# Patient Record
Sex: Female | Born: 1940 | Race: White | Hispanic: No | Marital: Single | Smoking: Former smoker
Health system: Southern US, Community
[De-identification: ages and names within clinical notes are randomized; demographics above are authoritative.]

## PROBLEM LIST (undated history)

## (undated) DIAGNOSIS — M810 Age-related osteoporosis without current pathological fracture: Secondary | ICD-10-CM

## (undated) DIAGNOSIS — R011 Cardiac murmur, unspecified: Secondary | ICD-10-CM

## (undated) DIAGNOSIS — F32A Depression, unspecified: Secondary | ICD-10-CM

## (undated) DIAGNOSIS — K429 Umbilical hernia without obstruction or gangrene: Secondary | ICD-10-CM

## (undated) DIAGNOSIS — M1990 Unspecified osteoarthritis, unspecified site: Secondary | ICD-10-CM

## (undated) DIAGNOSIS — R42 Dizziness and giddiness: Secondary | ICD-10-CM

## (undated) DIAGNOSIS — F329 Major depressive disorder, single episode, unspecified: Secondary | ICD-10-CM

## (undated) DIAGNOSIS — F419 Anxiety disorder, unspecified: Secondary | ICD-10-CM

## (undated) DIAGNOSIS — R7303 Prediabetes: Secondary | ICD-10-CM

## (undated) HISTORY — PX: TONSILLECTOMY: SUR1361

## (undated) HISTORY — PX: COLONOSCOPY: SHX174

## (undated) HISTORY — PX: BUNIONECTOMY: SHX129

## (undated) HISTORY — PX: BREAST EXCISIONAL BIOPSY: SUR124

## (undated) HISTORY — PX: BACK SURGERY: SHX140

## (undated) HISTORY — PX: OTHER SURGICAL HISTORY: SHX169

## (undated) HISTORY — PX: APPENDECTOMY: SHX54

---

## 1998-02-25 ENCOUNTER — Other Ambulatory Visit: Admission: RE | Admit: 1998-02-25 | Discharge: 1998-02-25 | Payer: Self-pay | Admitting: Obstetrics and Gynecology

## 1998-03-28 ENCOUNTER — Encounter: Admission: RE | Admit: 1998-03-28 | Discharge: 1998-06-26 | Payer: Self-pay | Admitting: Family Medicine

## 1999-11-27 ENCOUNTER — Encounter: Payer: Self-pay | Admitting: Family Medicine

## 1999-11-27 ENCOUNTER — Ambulatory Visit (HOSPITAL_COMMUNITY): Admission: RE | Admit: 1999-11-27 | Discharge: 1999-11-27 | Payer: Self-pay | Admitting: Family Medicine

## 2000-06-09 ENCOUNTER — Other Ambulatory Visit: Admission: RE | Admit: 2000-06-09 | Discharge: 2000-06-09 | Payer: Self-pay | Admitting: Family Medicine

## 2000-06-15 ENCOUNTER — Encounter: Payer: Self-pay | Admitting: Family Medicine

## 2000-06-15 ENCOUNTER — Encounter: Admission: RE | Admit: 2000-06-15 | Discharge: 2000-06-15 | Payer: Self-pay | Admitting: Family Medicine

## 2001-06-21 ENCOUNTER — Other Ambulatory Visit: Admission: RE | Admit: 2001-06-21 | Discharge: 2001-06-21 | Payer: Self-pay | Admitting: Family Medicine

## 2002-04-26 ENCOUNTER — Ambulatory Visit (HOSPITAL_COMMUNITY): Admission: RE | Admit: 2002-04-26 | Discharge: 2002-04-26 | Payer: Self-pay | Admitting: Family Medicine

## 2002-04-26 ENCOUNTER — Encounter: Payer: Self-pay | Admitting: Family Medicine

## 2002-05-03 ENCOUNTER — Encounter: Payer: Self-pay | Admitting: Family Medicine

## 2002-05-03 ENCOUNTER — Encounter: Admission: RE | Admit: 2002-05-03 | Discharge: 2002-05-03 | Payer: Self-pay | Admitting: Family Medicine

## 2002-08-16 ENCOUNTER — Encounter: Admission: RE | Admit: 2002-08-16 | Discharge: 2002-08-16 | Payer: Self-pay | Admitting: Family Medicine

## 2002-08-16 ENCOUNTER — Encounter: Payer: Self-pay | Admitting: Family Medicine

## 2003-05-03 ENCOUNTER — Encounter: Payer: Self-pay | Admitting: Family Medicine

## 2003-05-03 ENCOUNTER — Ambulatory Visit (HOSPITAL_COMMUNITY): Admission: RE | Admit: 2003-05-03 | Discharge: 2003-05-03 | Payer: Self-pay | Admitting: Family Medicine

## 2003-11-20 ENCOUNTER — Emergency Department (HOSPITAL_COMMUNITY): Admission: EM | Admit: 2003-11-20 | Discharge: 2003-11-20 | Payer: Self-pay | Admitting: Emergency Medicine

## 2003-12-04 ENCOUNTER — Ambulatory Visit (HOSPITAL_COMMUNITY): Admission: RE | Admit: 2003-12-04 | Discharge: 2003-12-04 | Payer: Self-pay | Admitting: Family Medicine

## 2003-12-06 ENCOUNTER — Inpatient Hospital Stay (HOSPITAL_COMMUNITY): Admission: EM | Admit: 2003-12-06 | Discharge: 2003-12-09 | Payer: Self-pay | Admitting: Family Medicine

## 2004-05-09 ENCOUNTER — Ambulatory Visit (HOSPITAL_COMMUNITY): Admission: RE | Admit: 2004-05-09 | Discharge: 2004-05-09 | Payer: Self-pay | Admitting: Family Medicine

## 2004-08-19 ENCOUNTER — Other Ambulatory Visit: Admission: RE | Admit: 2004-08-19 | Discharge: 2004-08-19 | Payer: Self-pay | Admitting: Family Medicine

## 2004-09-03 ENCOUNTER — Encounter: Admission: RE | Admit: 2004-09-03 | Discharge: 2004-09-03 | Payer: Self-pay | Admitting: Family Medicine

## 2005-07-07 ENCOUNTER — Ambulatory Visit (HOSPITAL_COMMUNITY): Admission: RE | Admit: 2005-07-07 | Discharge: 2005-07-07 | Payer: Self-pay | Admitting: Family Medicine

## 2005-11-26 ENCOUNTER — Other Ambulatory Visit: Admission: RE | Admit: 2005-11-26 | Discharge: 2005-11-26 | Payer: Self-pay | Admitting: Family Medicine

## 2005-12-03 ENCOUNTER — Encounter: Admission: RE | Admit: 2005-12-03 | Discharge: 2006-02-25 | Payer: Self-pay | Admitting: Family Medicine

## 2006-08-17 ENCOUNTER — Ambulatory Visit (HOSPITAL_COMMUNITY): Admission: RE | Admit: 2006-08-17 | Discharge: 2006-08-17 | Payer: Self-pay | Admitting: Family Medicine

## 2006-12-03 ENCOUNTER — Other Ambulatory Visit: Admission: RE | Admit: 2006-12-03 | Discharge: 2006-12-03 | Payer: Self-pay | Admitting: Family Medicine

## 2006-12-23 ENCOUNTER — Encounter: Admission: RE | Admit: 2006-12-23 | Discharge: 2007-03-23 | Payer: Self-pay | Admitting: Family Medicine

## 2007-03-24 ENCOUNTER — Encounter: Admission: RE | Admit: 2007-03-24 | Discharge: 2007-06-22 | Payer: Self-pay | Admitting: Family Medicine

## 2007-05-11 ENCOUNTER — Encounter: Admission: RE | Admit: 2007-05-11 | Discharge: 2007-08-09 | Payer: Self-pay | Admitting: Family Medicine

## 2007-09-06 ENCOUNTER — Ambulatory Visit (HOSPITAL_COMMUNITY): Admission: RE | Admit: 2007-09-06 | Discharge: 2007-09-06 | Payer: Self-pay | Admitting: Family Medicine

## 2007-12-06 ENCOUNTER — Other Ambulatory Visit: Admission: RE | Admit: 2007-12-06 | Discharge: 2007-12-06 | Payer: Self-pay | Admitting: Family Medicine

## 2007-12-21 ENCOUNTER — Encounter: Admission: RE | Admit: 2007-12-21 | Discharge: 2007-12-21 | Payer: Self-pay | Admitting: Family Medicine

## 2008-02-07 ENCOUNTER — Encounter: Admission: RE | Admit: 2008-02-07 | Discharge: 2008-05-07 | Payer: Self-pay | Admitting: Orthopedic Surgery

## 2008-05-10 ENCOUNTER — Encounter: Admission: RE | Admit: 2008-05-10 | Discharge: 2008-06-21 | Payer: Self-pay | Admitting: Family Medicine

## 2008-09-11 ENCOUNTER — Ambulatory Visit (HOSPITAL_COMMUNITY): Admission: RE | Admit: 2008-09-11 | Discharge: 2008-09-11 | Payer: Self-pay | Admitting: Family Medicine

## 2008-09-20 ENCOUNTER — Encounter: Admission: RE | Admit: 2008-09-20 | Discharge: 2008-09-20 | Payer: Self-pay | Admitting: Family Medicine

## 2009-07-13 ENCOUNTER — Encounter: Admission: RE | Admit: 2009-07-13 | Discharge: 2009-07-13 | Payer: Self-pay | Admitting: Family Medicine

## 2009-11-19 ENCOUNTER — Ambulatory Visit (HOSPITAL_COMMUNITY): Admission: RE | Admit: 2009-11-19 | Discharge: 2009-11-19 | Payer: Self-pay | Admitting: Family Medicine

## 2010-10-20 ENCOUNTER — Encounter: Payer: Self-pay | Admitting: Family Medicine

## 2011-01-22 ENCOUNTER — Other Ambulatory Visit: Payer: Self-pay | Admitting: Family Medicine

## 2011-01-22 ENCOUNTER — Other Ambulatory Visit (HOSPITAL_COMMUNITY)
Admission: RE | Admit: 2011-01-22 | Discharge: 2011-01-22 | Disposition: A | Discharge status: Home / Self Care | Payer: Medicare Other | Source: Ambulatory Visit | Attending: Family Medicine | Admitting: Family Medicine

## 2011-01-22 DIAGNOSIS — Z124 Encounter for screening for malignant neoplasm of cervix: Secondary | ICD-10-CM | POA: Insufficient documentation

## 2011-02-14 NOTE — H&P (Signed)
NAME:  Lynn Kim, Lynn Kim                        ACCOUNT NO.:  [id number]   MEDICAL RECORD NO.:  [id number]                   PATIENT TYPE:  INP   LOCATION:  3738                                 FACILITY:  [facility]   PHYSICIAN:  Hollice Espy, M.D.            DATE OF BIRTH:  Feb 04, 1941   DATE OF ADMISSION:  12/06/2003  DATE OF DISCHARGE:                                HISTORY & PHYSICAL   PRIMARY CARE DOCTOR:  Dr. Stacie Acres. White.   CHIEF COMPLAINT:  Abdominal pain.   HISTORY OF PRESENT ILLNESS:  This is a 70 year old white female who, on the  1st of March of this year, completed the second of 2 spinal scoliosis  correction surgeries at [facility].  Postop, the patient was doing  relatively well, although she was noting some increasing leg edema.  Over  the past few days, she has been noting increased abdominal pain and  swelling.  Initially, there had been some concern the patient might be  having a DVT, according to her, and she had an ultrasound done of her legs  which showed no evidence of any clot.  After speaking with her doctors at  [facility], she was advised to come into the hospital and have a CT of the abdomen  done.  The patient came into [facility] Emergency Room on the night of December 05, 2003.  Her lab work was unremarkable.  She had no significant white  count.  Her hemoglobin and hematocrit were mildly decreased at 9.3 and 28.8.  Her BUN and creatinine were found to be stable as well, however, CT of the  abdomen and pelvis noted that the patient had a large left retroperitoneal  fluid collection.  The preliminary read also read that this was felt to be  more of a seroma rather than an abscess.  The patient did not have any  elevated temperatures.  She was put on some p.o. Valium, which immediately  relaxed the patient, and she said she felt much better; she felt less of  this abdominal tenseness.  She denies any abdominal pain following the  Valium.  She did not  receive any IV narcotics and did not need any.  Dr.  Jennette Kettle, the ER attending, spoke to multiple physicians at [facility] and  apparently the surgeons who performed the procedure agreed to accept the  patient, however, beds were not available at [facility] as of December 06, 2003 at 12 p.m.  Dr. Jennette Kettle spoke with a number of physicians here as well,  but after it was found that the patient's PCP was Dr. Laurann Montana, it was  felt that the patient could go to the [facility].  The plan  will be for her to be stabilized and monitored while she is in the hospital.  If a bed should open up at [facility], she will be transferred over  there, otherwise,  she may need to have a drainage procedure done here.  Currently, the patient states that she has no complaints.  She denies any  headaches, abdominal pain, fevers, chills, chest pain, shortness of breath,  abdominal pain, extremity pain, hematuria, dysuria, constipation or  diarrhea.  She was having some mild shortness of breath when she first came  in.   PAST MEDICAL HISTORY:  1. Past medical history is for an appendectomy a few years ago.  2. She is status post 2-of-2 spinal scoliosis correction surgeries for a     curvature greater than 60%; the first procedure was done approximately 1     month ago; the second was done 10 days ago.  3. She also has 2 children by spontaneous vaginal deliveries.   MEDICATIONS:  The patient was on p.r.n. OxyContin, which she really says  that she did not need postop.   ALLERGIES:  She is allergic to PENICILLIN, SULFA, ERYTHROMYCIN, LANOLIN and  PETROLEUM-BASED PRODUCTS.   FAMILY HISTORY:  Family history is for CAD and questionable history of  breast CA in her mom.   PHYSICAL EXAMINATION:  VITALS:  Her vitals on admission were temperature of  97.9, pulse 90, respirations 20, blood pressure 148/67.  She is saturating  99% on room air.  GENERAL:  In general, she is alert and oriented  and appears slightly  relaxed, felt to be an effect secondary to the Valium.  She is certainly in  no apparent distress.  HEENT:  Normocephalic, atraumatic.  She looks slightly fatigued.  Mucous  membranes are moist.  NECK:  She has no carotid bruits.  HEART:  Regular rate and rhythm, S1, S2.  She had a questionable 1-2/6  systolic ejection murmur, however, this may not be so.  LUNGS:  Lungs are clear to auscultation bilaterally.  ABDOMEN:  Abdomen is noted to be distended and soft, nontender.  Somewhat  decreased breath sounds.  Her incision is approximately 4-5 inches, running  along in a vertical fashion in the left upper down to right lower quadrant.  Staples are intact.  Incision is clean and dry; there is no drainage.  EXTREMITIES:  Extremities show no clubbing, cyanosis, or edema.  She has on  TED hose which were started back when she was having some complaints of  lower extremity edema postop.   LABORATORY WORK:  CT is as above in the history and physical.   Her white count is 9.8, hemoglobin and hematocrit 9.3/28.8, MCV of 89,  platelet count of 321,000.  Her sodium is 137, potassium 3.8, chloride 99,  bicarb 28, BUN 8, creatinine 1, glucose is 107.   ASSESSMENT AND PLAN:  This is a 70 year old white female, status post a  significant spinal scoliosis correction surgery at [facility], coming in  with 2 days of abdominal pain and found to have a retroperitoneal fluid  collection, to make sure the patient is stable.  We will try to see if a bed  opens up at [facility], as the patient is accepted, once a bed is free; if not,  the patient will need drainage of this fluid.  I question if this can be  done by interventional radiology.  By the film reports, a seroma greater  than an abscess is suspected and I do note the patient does not have  increased white count or a temperature.  I would therefore hold off on antibiotics unless this changes.  Dr. Molli Hazard B. Daphine Deutscher from surgery   evaluated the  patient earlier and I will discuss with him whether this can  be drained by interventional radiology.                                                Hollice Espy, M.D.    SKK/MEDQ  D:  12/06/2003  T:  12/07/2003  Job:  [id number]

## 2011-02-14 NOTE — Consult Note (Signed)
NAME:  Lynn Kim, Lynn Kim                        ACCOUNT NO.:  [id number]   MEDICAL RECORD NO.:  [id number]                   PATIENT TYPE:  INP   LOCATION:  3738                                 FACILITY:  [facility]   PHYSICIAN:  Mark C. Vernie Ammons, M.D.               DATE OF BIRTH:  Sep 10, 1941   DATE OF CONSULTATION:  12/07/2003  DATE OF DISCHARGE:                                   CONSULTATION   This very pleasant 70 year old white female underwent second-stage scoliosis  surgery on March 1 at [facility]. This required an abdominal approach with  mobilization of the bowel to access her spine. The patient did well until  she had some lower extremity swelling which was evaluated with a Doppler  ultrasound to rule out DVT. The patient then noted progressive abdominal  swelling starting approximately five days ago and increasing with eventual  associated pain. She did not ever report any significant flank pain. She was  imaged with a CT scan of the abdomen that revealed a large retroperitoneal  fluid collection. It did not appear to be an abscess due to its homogeneous  nature. There also was noted some mild left hydronephrosis but contrast was  seen within the collecting system promptly. The later did appear normal as  did the right kidney. Neither kidney appeared to have any parenchymal  lesions. She had no prior significant past urologic history. She also denies  any voiding complaints or hematuria.   PAST MEDICAL HISTORY:  1. Positive for scoliosis.  2. Two vaginal deliveries.  Otherwise, she has been in good health.   PAST SURGICAL HISTORY:  She has had the two scoliosis correction surgeries  as noted above. Appendectomy several years ago.   MEDICATIONS ON ADMISSION:  OxyContin.   ALLERGIES:  PENICILLIN, SULFA, ERYTHROMYCIN, LANOLIN PETROLEUM BASED  PRODUCTS.   FAMILY HISTORY:  Father has a history of coronary artery disease. There is  also a possible history of breast cancer in her  mother.   REVIEW OF SYSTEMS:  Reveals no bowel difficulty. She has not had any urinary  incontinence or other voiding complaints. She does have some pain in the  lower abdomen, but it is improved now with the drainage of the fluid  collection. She has no chest pain or shortness of breath.   PHYSICAL EXAMINATION:  GENERAL:  The patient is a well-developed, well-  nourished, white female in no apparent distress. She is afebrile,  temperature of 98.3, blood pressure is 133/66, pulse 96, respirations 20, O2  saturation 99.  HEENT:  Atraumatic and normocephalic. Oropharynx is clear.  NECK:  Reveals no mass, midline trachea.  LUNGS:  Clear bilaterally with no respiratory effort.  CARDIOVASCULAR:  Regular rate and rhythm with possible soft systolic  ejection murmur.  ABDOMEN:  Soft. It is flat at this time with an incision in the left flank  running obliquely into the left lower quadrant, a drain  in the left lower  quadrant draining slightly cloudy amber-appearing fluid. There are staples  in her skin incision, but no evidence of infection.  EXTREMITIES:  Without clubbing, cyanosis, or edema.  SKIN:  Warm and dry.  NEUROLOGICAL:  She is alert and oriented with appropriate mood and affect.   LABORATORY DATA:  White blood cell count 9.8, hemoglobin and hematocrit 9.3  and 28.8, platelets are normal at 321,000. Creatinine is 0.8; creatinine  from the fluid drained from her abdomen 0.8.   IMPRESSION:  The left retroperitoneal fluid collection in this patient after  recent retroperitoneal surgery appears to have not come from the urinary  tract. The fluid creatinine is equal to that of the serum and therefore not  urine. It is most likely lymph/serous in nature. It does not appear to be  infected, nor does the patient appear toxic in any way. The mild left  hydronephrosis seen on her CT scan is likely secondary to her recent  ureteral mobilization and the compressive effects of the large  fluid  collection. I do not recommend any further treatment for the hydronephrosis  other than drainage, and followup will undoubtedly occur with reimaging of  the abdomen which will be needed to follow her fluid collection. I am going  to send a second specimen of the fluid off just to confirm that this is not  urine. If the second specimen returns with creatinine similar to serum, no  further urologic followup will be needed at this time.                                               Mark C. Vernie Ammons, M.D.    MCO/MEDQ  D:  12/07/2003  T:  12/09/2003  Job:  [id number]   cc:   Stacie Acres. White, M.D.  [address]  [place]  [place] [address]  Fax: [phone]

## 2011-02-14 NOTE — Discharge Summary (Signed)
NAME:  Lynn Kim, Lynn Kim                        ACCOUNT NO.:  [id number]   MEDICAL RECORD NO.:  [id number]                   PATIENT TYPE:  INP   LOCATION:  3738                                 FACILITY:  [facility]   PHYSICIAN:  Melissa L. Ladona Ridgel, MD               DATE OF BIRTH:  [birth year]   DATE OF ADMISSION:  12/06/2003  DATE OF DISCHARGE:  12/09/2003                                 DISCHARGE SUMMARY   ADMITTING DIAGNOSIS:  Abdominal pain with retroperitoneal fluid collection.   DISCHARGE DIAGNOSES:  1. Retroperitoneal fluid collection status post scoliosis surgery requiring     interventional radiology drainage.  2. Soft loose stool being followed up for Clostridium difficile colitis.   HISTORY OF PRESENT ILLNESS:  Patient is a 70 year old white female who on  November 28, 2003 completed the second phase of her spinal scoliosis corrective  surgery at [facility].  She returned home postoperatively and was doing  well until she noted some leg edema which was evaluated with Doppler as an  outpatient and found to have no DVT.  On the day of admission however, she  developed some new symptoms __________ back and abdominal discomfort, the  sensation was of a stretching or pulling, she came to the emergency room and  was found to have a large fluid collection retroperitoneally located and  causing some impingement on her left ureter resulting in hydronephrosis.   HOSPITAL COURSE:  The patient was admitted to the internal medicine  service/hospitalist service and taken to the interventional radiology lab  for drain placement.  The procedure went well and the patient was noted to  drain a large quantity of serous yellow fluid, the creatinine of which was  0.8 making it not consistent with urine.  The fluid cultures at this time  remained negative.  The patient was seen by infectious disease during the  course of the hospitalization who assisted with broad-spectrum coverage as  her initial  white count was 11.2 with low-grade temperature.  She had been  placed initially on Cipro and Flagyl IV, this was changed when it became  obvious that this was more of a serous collection to Cipro alone and  eventually the Cipro was made an oral delivery.  Surgery was kind enough to  evaluate this patient for me just to assist in making sure that all of her  needs were met while she awaited transfer to [facility].  They concurred with the  current supportive therapy continuing to drain the area and we are planning  to do CT of the abdomen should she continue to remain with [place] on Monday.  Her activity was advanced at the time of discharge to being up with moderate  activity and her corset in place.  On December 09, 2003 the drain showed  approximately only 75 mL of serous fluid, she was afebrile at 98.9, blood  pressure was 103/69 with a saturation  of 95%.  Her exam on the day of  discharge revealed generally in mild distress secondary to some abdominal  discomfort however, she denied nausea or vomiting; her pupils are equal,  round, reactive to light; extraocular muscles are intact; chest is clear to  auscultation; abdomen remains slightly distended, slightly firm, but not  hard with some pain on palpation but no guarding or rebound, the drain is  clean, dry, and intact; extremities show no edema and 2+ pulses; her back  reveals a healing incision and is Steri-Stripped, it is clean, dry, and  intact.   The patient has had some small soft stools while hospitalized, these had  been requested to be sent for C. difficile in light of the fact that she has  had antibiotic exposure recently with her operative course, to date no  cultures have returned.  On the day of discharge her laboratory values  reveal a hemoglobin of 10.4, hematocrit of 30.9, white blood count remains  at 11.2, platelets are 318, her CMET is sodium of 134, potassium of 3.8,  chloride 104, CO2 of 27, BUN of 9, creatinine 0.8, and  glucose of 101.  T-  max appeared to be 99.2.   The patient's primary orthopedist and primary surgeon have been aware of her  care during the course of this hospitalization and have arranged for her to  be discharged to their care when a bed becomes available.  So at the time of  discharge the patient is deemed stable.  Recommendations will be only to  follow up the final culture results on the fluid obtained here at [facility]  and obtain stool for C. difficile should she continue to have soft stool.  Also the left hydronephrosis should be reevaluated since the serous  collection has been drained and is less likely to be impinging.  Urology has  seen the patient here at [facility] and supported pretty much the plan that  has already been in place and they had no further recommendations other than  the current course.                                                Melissa L. Ladona Ridgel, MD    MLT/MEDQ  D:  12/09/2003  T:  12/09/2003  Job:  [id number]   cc:   Dr. Senaida Ores @ [facility]   Dr. Joselyn Glassman @ [facility]   Fax [phone]

## 2011-02-14 NOTE — Consult Note (Signed)
NAME:  Lynn Kim, Lynn Kim                        ACCOUNT NO.:  [id number]   MEDICAL RECORD NO.:  [id number]                   PATIENT TYPE:  INP   LOCATION:  3738                                 FACILITY:  [facility]   PHYSICIAN:  Adolph Pollack, M.D.            DATE OF BIRTH:  05-Oct-1940   DATE OF CONSULTATION:  12/08/2003  DATE OF DISCHARGE:  12/09/2003                                   CONSULTATION   REASON FOR CONSULTATION:  Left retroperitoneal fluid collection  postoperatively.   HISTORY OF PRESENT ILLNESS:  This is a 70 year old female with scoliosis.  She underwent a complex two-stage procedure at [facility], first from a posterior  approach, and then on November 28, 2003 from an anterior approach for  stabilization of her lower lumbar spine. She was discharged from the  hospital and had been ambulatory with a brace on. She began having  increasing lower extremity edema and had a Duplex ultrasound negative for  DVT. She then had progressive increasing abdominal distention and pain and  presented to the emergency department. A CT scan demonstrated a large left  retroperitoneal fluid collection and mild left sided necrosis. She  subsequently was admitted secondary to that. Of note that her white blood  cell count was normal on admission. BUN and creatinine within normal limits.  While here in the hospital, she was empirically started on antibiotics. They  tried to transfer her back to [facility] although they said there were no beds  available. CT-guided left retroperitoneal drainage was performed, and a  large amount of fluid was evacuated. Fluid amylase level was normal. Fluid  creatinine level has been normal twice. The fluid cultures show no growth to  date. Suspicion was that she may have a lymphocele or seroma. She is  tolerating a diet and passing gas. I was asked to see her for further  management suggestions.   PAST MEDICAL HISTORY:  1. Appendicitis.  2. Scoliosis.  3.  Constipation.   PAST SURGICAL HISTORY:  1. Appendectomy.  2. Complex two-stage procedure for scoliosis as mentioned above.   ALLERGIES:  PENICILLIN, SULFA DRUGS, ERYTHROMYCIN.   CURRENT MEDICATIONS:  1. Oxycodone.  2. Cipro.  3. Valium.   SOCIAL HISTORY:  She denies any tobacco or alcohol use.   REVIEW OF SYSTEMS:  CARDIOVASCULAR:  There is no known heart disease or  hypertension. PULMONARY:  No chronic lung disease, pneumonia, asthma.  ENDOCRINE:  No diabetes or thyroid disease.  GENITOURINARY:  No renal disease or kidney stones. GASTROINTESTINAL:  She  does have constipation as mentioned above.   PHYSICAL EXAMINATION:  GENERAL:  A very pleasant, well-developed, well-  nourished female in no acute distress sitting up in the bed.  VITAL SIGNS:  She is afebrile with normal vital signs.  ABDOMEN:  Her abdomen is soft. It is slightly distended with active bowel  sounds. There is a right lower quadrant scar. There is an incision  in the  left lower quadrant that is oblique in nature with staples in. It is clean  without evidence of infection. She has a drain also in the left lower  quadrant with serous fluid coming from it.   LABORATORY DATA:  White blood cell count yesterday was 11,200. Amylase and  lipase normal. Cultures from the fluid are no growth to date.   IMPRESSION:  Left retroperitoneal postoperative seroma or possibly  lymphocele. The drain is in, and drainage has started to decrease some  versus the initial aspirate. So far, I agree with all management done.   RECOMMENDATIONS:  I will get her out of bed in her brace. I would repeat the  CT scan on Monday, March 14, if she is not already transferred to [facility]. We  will leave the drain in now until the drainage is still substantially less.                                               Adolph Pollack, M.D.    Kari Baars  D:  12/08/2003  T:  12/10/2003  Job:  [id number]

## 2011-02-27 ENCOUNTER — Other Ambulatory Visit (HOSPITAL_COMMUNITY): Payer: Self-pay | Admitting: Family Medicine

## 2011-02-27 DIAGNOSIS — Z1231 Encounter for screening mammogram for malignant neoplasm of breast: Secondary | ICD-10-CM

## 2011-03-10 ENCOUNTER — Ambulatory Visit (HOSPITAL_COMMUNITY)
Admission: RE | Admit: 2011-03-10 | Discharge: 2011-03-10 | Disposition: A | Discharge status: Home / Self Care | Payer: Medicare Other | Source: Ambulatory Visit | Attending: Family Medicine | Admitting: Family Medicine

## 2011-03-10 DIAGNOSIS — Z1231 Encounter for screening mammogram for malignant neoplasm of breast: Secondary | ICD-10-CM | POA: Insufficient documentation

## 2011-12-01 ENCOUNTER — Other Ambulatory Visit: Payer: Self-pay | Admitting: Gastroenterology

## 2012-03-16 ENCOUNTER — Other Ambulatory Visit: Payer: Self-pay | Admitting: Family Medicine

## 2012-04-05 ENCOUNTER — Encounter (HOSPITAL_COMMUNITY): Payer: Self-pay

## 2012-04-05 ENCOUNTER — Encounter (HOSPITAL_COMMUNITY)
Admission: RE | Admit: 2012-04-05 | Discharge: 2012-04-05 | Disposition: A | Discharge status: Home / Self Care | Payer: Medicare Other | Source: Ambulatory Visit | Attending: Family Medicine | Admitting: Family Medicine

## 2012-04-05 DIAGNOSIS — M810 Age-related osteoporosis without current pathological fracture: Secondary | ICD-10-CM | POA: Insufficient documentation

## 2012-04-05 HISTORY — DX: Age-related osteoporosis without current pathological fracture: M81.0

## 2012-04-05 HISTORY — DX: Dizziness and giddiness: R42

## 2012-04-05 HISTORY — DX: Major depressive disorder, single episode, unspecified: F32.9

## 2012-04-05 HISTORY — DX: Depression, unspecified: F32.A

## 2012-04-05 MED ORDER — ZOLEDRONIC ACID 5 MG/100ML IV SOLN
5.0000 mg | Freq: Once | INTRAVENOUS | Status: AC
  Administered 2012-04-05: 5 mg via INTRAVENOUS
  Filled 2012-04-05: qty 100

## 2012-04-05 MED ORDER — SODIUM CHLORIDE 0.9 % IV SOLN
INTRAVENOUS | Status: DC
  Administered 2012-04-05: 14:00:00 via INTRAVENOUS

## 2012-04-26 ENCOUNTER — Ambulatory Visit: Payer: Medicare Other | Attending: Family Medicine | Admitting: Physical Therapy

## 2012-04-26 DIAGNOSIS — M25559 Pain in unspecified hip: Secondary | ICD-10-CM | POA: Insufficient documentation

## 2012-04-26 DIAGNOSIS — IMO0001 Reserved for inherently not codable concepts without codable children: Secondary | ICD-10-CM | POA: Insufficient documentation

## 2012-04-26 DIAGNOSIS — M545 Low back pain, unspecified: Secondary | ICD-10-CM | POA: Insufficient documentation

## 2012-05-03 ENCOUNTER — Ambulatory Visit: Payer: Medicare Other | Attending: Family Medicine | Admitting: Physical Therapy

## 2012-05-03 DIAGNOSIS — M25559 Pain in unspecified hip: Secondary | ICD-10-CM | POA: Insufficient documentation

## 2012-05-03 DIAGNOSIS — IMO0001 Reserved for inherently not codable concepts without codable children: Secondary | ICD-10-CM | POA: Insufficient documentation

## 2012-05-03 DIAGNOSIS — M545 Low back pain, unspecified: Secondary | ICD-10-CM | POA: Insufficient documentation

## 2012-05-10 ENCOUNTER — Ambulatory Visit: Payer: Medicare Other | Admitting: Physical Therapy

## 2012-05-17 ENCOUNTER — Ambulatory Visit: Payer: Medicare Other | Admitting: Physical Therapy

## 2012-05-24 ENCOUNTER — Ambulatory Visit: Payer: Medicare Other | Admitting: Physical Therapy

## 2012-06-01 ENCOUNTER — Ambulatory Visit: Payer: Medicare Other | Attending: Family Medicine | Admitting: Physical Therapy

## 2012-06-01 DIAGNOSIS — M545 Low back pain, unspecified: Secondary | ICD-10-CM | POA: Insufficient documentation

## 2012-06-01 DIAGNOSIS — M25559 Pain in unspecified hip: Secondary | ICD-10-CM | POA: Insufficient documentation

## 2012-06-01 DIAGNOSIS — IMO0001 Reserved for inherently not codable concepts without codable children: Secondary | ICD-10-CM | POA: Insufficient documentation

## 2012-06-08 ENCOUNTER — Ambulatory Visit: Payer: Medicare Other | Admitting: Physical Therapy

## 2012-06-15 ENCOUNTER — Ambulatory Visit: Payer: Medicare Other | Admitting: Physical Therapy

## 2012-06-22 ENCOUNTER — Ambulatory Visit: Payer: Medicare Other | Admitting: Physical Therapy

## 2012-06-29 ENCOUNTER — Ambulatory Visit: Payer: Medicare Other | Attending: Family Medicine | Admitting: Physical Therapy

## 2012-06-29 DIAGNOSIS — M25559 Pain in unspecified hip: Secondary | ICD-10-CM | POA: Insufficient documentation

## 2012-06-29 DIAGNOSIS — IMO0001 Reserved for inherently not codable concepts without codable children: Secondary | ICD-10-CM | POA: Insufficient documentation

## 2012-06-29 DIAGNOSIS — M545 Low back pain, unspecified: Secondary | ICD-10-CM | POA: Insufficient documentation

## 2012-07-06 ENCOUNTER — Ambulatory Visit: Payer: Medicare Other | Admitting: Physical Therapy

## 2012-07-15 ENCOUNTER — Other Ambulatory Visit (HOSPITAL_COMMUNITY): Payer: Self-pay | Admitting: Family Medicine

## 2012-07-15 DIAGNOSIS — Z1231 Encounter for screening mammogram for malignant neoplasm of breast: Secondary | ICD-10-CM

## 2012-07-27 ENCOUNTER — Ambulatory Visit (HOSPITAL_COMMUNITY)
Admission: RE | Admit: 2012-07-27 | Discharge: 2012-07-27 | Disposition: A | Discharge status: Home / Self Care | Payer: Medicare Other | Source: Ambulatory Visit | Attending: Family Medicine | Admitting: Family Medicine

## 2012-07-27 DIAGNOSIS — Z1231 Encounter for screening mammogram for malignant neoplasm of breast: Secondary | ICD-10-CM

## 2012-08-02 ENCOUNTER — Ambulatory Visit: Payer: Medicare Other | Attending: Family Medicine | Admitting: Physical Therapy

## 2012-08-02 DIAGNOSIS — IMO0001 Reserved for inherently not codable concepts without codable children: Secondary | ICD-10-CM | POA: Insufficient documentation

## 2012-08-02 DIAGNOSIS — M25559 Pain in unspecified hip: Secondary | ICD-10-CM | POA: Insufficient documentation

## 2012-08-02 DIAGNOSIS — M545 Low back pain, unspecified: Secondary | ICD-10-CM | POA: Insufficient documentation

## 2012-08-09 ENCOUNTER — Ambulatory Visit: Payer: Medicare Other | Admitting: Physical Therapy

## 2012-08-16 ENCOUNTER — Ambulatory Visit: Payer: Medicare Other | Admitting: Physical Therapy

## 2012-08-23 ENCOUNTER — Ambulatory Visit: Payer: Medicare Other | Admitting: Physical Therapy

## 2012-08-30 ENCOUNTER — Ambulatory Visit: Payer: Medicare Other | Attending: Family Medicine | Admitting: Physical Therapy

## 2012-08-30 DIAGNOSIS — M545 Low back pain, unspecified: Secondary | ICD-10-CM | POA: Insufficient documentation

## 2012-08-30 DIAGNOSIS — M25559 Pain in unspecified hip: Secondary | ICD-10-CM | POA: Insufficient documentation

## 2012-08-30 DIAGNOSIS — IMO0001 Reserved for inherently not codable concepts without codable children: Secondary | ICD-10-CM | POA: Insufficient documentation

## 2012-09-06 ENCOUNTER — Ambulatory Visit: Payer: Medicare Other | Admitting: Physical Therapy

## 2012-09-13 ENCOUNTER — Encounter: Payer: Medicare Other | Admitting: Physical Therapy

## 2013-01-11 ENCOUNTER — Ambulatory Visit: Payer: Medicare Other | Attending: Family Medicine | Admitting: Physical Therapy

## 2013-01-11 DIAGNOSIS — IMO0001 Reserved for inherently not codable concepts without codable children: Secondary | ICD-10-CM | POA: Insufficient documentation

## 2013-01-20 ENCOUNTER — Ambulatory Visit: Payer: Medicare Other | Admitting: Physical Therapy

## 2013-01-28 ENCOUNTER — Ambulatory Visit: Payer: Medicare Other | Attending: Family Medicine | Admitting: Physical Therapy

## 2013-01-28 DIAGNOSIS — M25579 Pain in unspecified ankle and joints of unspecified foot: Secondary | ICD-10-CM | POA: Insufficient documentation

## 2013-01-28 DIAGNOSIS — M25569 Pain in unspecified knee: Secondary | ICD-10-CM | POA: Insufficient documentation

## 2013-01-28 DIAGNOSIS — IMO0001 Reserved for inherently not codable concepts without codable children: Secondary | ICD-10-CM | POA: Insufficient documentation

## 2013-05-06 ENCOUNTER — Other Ambulatory Visit: Payer: Self-pay | Admitting: Dermatology

## 2013-07-18 ENCOUNTER — Ambulatory Visit: Payer: Medicare Other | Attending: Family Medicine | Admitting: Physical Therapy

## 2013-07-18 DIAGNOSIS — M545 Low back pain, unspecified: Secondary | ICD-10-CM | POA: Insufficient documentation

## 2013-07-18 DIAGNOSIS — M25559 Pain in unspecified hip: Secondary | ICD-10-CM | POA: Insufficient documentation

## 2013-07-18 DIAGNOSIS — M546 Pain in thoracic spine: Secondary | ICD-10-CM | POA: Insufficient documentation

## 2013-07-18 DIAGNOSIS — IMO0001 Reserved for inherently not codable concepts without codable children: Secondary | ICD-10-CM | POA: Insufficient documentation

## 2013-07-25 ENCOUNTER — Ambulatory Visit: Payer: Medicare Other | Admitting: Physical Therapy

## 2013-07-26 ENCOUNTER — Other Ambulatory Visit: Payer: Self-pay | Admitting: Family Medicine

## 2013-07-26 ENCOUNTER — Other Ambulatory Visit (HOSPITAL_COMMUNITY)
Admission: RE | Admit: 2013-07-26 | Discharge: 2013-07-26 | Disposition: A | Discharge status: Home / Self Care | Payer: Medicare Other | Source: Ambulatory Visit | Attending: Family Medicine | Admitting: Family Medicine

## 2013-07-26 DIAGNOSIS — Z124 Encounter for screening for malignant neoplasm of cervix: Secondary | ICD-10-CM | POA: Insufficient documentation

## 2013-08-01 ENCOUNTER — Ambulatory Visit: Payer: Medicare Other | Attending: Family Medicine | Admitting: Physical Therapy

## 2013-08-01 DIAGNOSIS — M545 Low back pain, unspecified: Secondary | ICD-10-CM | POA: Insufficient documentation

## 2013-08-01 DIAGNOSIS — M25559 Pain in unspecified hip: Secondary | ICD-10-CM | POA: Insufficient documentation

## 2013-08-01 DIAGNOSIS — M546 Pain in thoracic spine: Secondary | ICD-10-CM | POA: Insufficient documentation

## 2013-08-01 DIAGNOSIS — IMO0001 Reserved for inherently not codable concepts without codable children: Secondary | ICD-10-CM | POA: Insufficient documentation

## 2013-09-13 ENCOUNTER — Other Ambulatory Visit (HOSPITAL_COMMUNITY): Payer: Self-pay | Admitting: Family Medicine

## 2013-09-13 DIAGNOSIS — Z1231 Encounter for screening mammogram for malignant neoplasm of breast: Secondary | ICD-10-CM

## 2013-09-15 ENCOUNTER — Ambulatory Visit (HOSPITAL_COMMUNITY)
Admission: RE | Admit: 2013-09-15 | Discharge: 2013-09-15 | Disposition: A | Discharge status: Home / Self Care | Payer: Medicare Other | Source: Ambulatory Visit | Attending: Family Medicine | Admitting: Family Medicine

## 2013-09-15 DIAGNOSIS — Z1231 Encounter for screening mammogram for malignant neoplasm of breast: Secondary | ICD-10-CM

## 2013-11-22 ENCOUNTER — Ambulatory Visit: Payer: Medicare Other | Attending: Family Medicine | Admitting: Physical Therapy

## 2013-11-22 DIAGNOSIS — M546 Pain in thoracic spine: Secondary | ICD-10-CM | POA: Insufficient documentation

## 2013-11-22 DIAGNOSIS — R5381 Other malaise: Secondary | ICD-10-CM | POA: Insufficient documentation

## 2013-11-22 DIAGNOSIS — IMO0001 Reserved for inherently not codable concepts without codable children: Secondary | ICD-10-CM | POA: Insufficient documentation

## 2013-11-22 DIAGNOSIS — M810 Age-related osteoporosis without current pathological fracture: Secondary | ICD-10-CM | POA: Insufficient documentation

## 2013-11-22 DIAGNOSIS — M545 Low back pain, unspecified: Secondary | ICD-10-CM | POA: Insufficient documentation

## 2013-11-28 ENCOUNTER — Ambulatory Visit: Payer: Medicare Other | Attending: Family Medicine | Admitting: Physical Therapy

## 2013-11-28 DIAGNOSIS — M545 Low back pain, unspecified: Secondary | ICD-10-CM | POA: Insufficient documentation

## 2013-11-28 DIAGNOSIS — M546 Pain in thoracic spine: Secondary | ICD-10-CM | POA: Insufficient documentation

## 2013-11-28 DIAGNOSIS — M810 Age-related osteoporosis without current pathological fracture: Secondary | ICD-10-CM | POA: Insufficient documentation

## 2013-11-28 DIAGNOSIS — IMO0001 Reserved for inherently not codable concepts without codable children: Secondary | ICD-10-CM | POA: Insufficient documentation

## 2013-11-28 DIAGNOSIS — R5381 Other malaise: Secondary | ICD-10-CM | POA: Insufficient documentation

## 2013-12-02 ENCOUNTER — Ambulatory Visit
Admission: RE | Admit: 2013-12-02 | Discharge: 2013-12-02 | Disposition: A | Discharge status: Home / Self Care | Payer: Medicare Other | Source: Ambulatory Visit | Attending: Family Medicine | Admitting: Family Medicine

## 2013-12-02 ENCOUNTER — Other Ambulatory Visit: Payer: Self-pay | Admitting: Family Medicine

## 2013-12-02 DIAGNOSIS — R0781 Pleurodynia: Secondary | ICD-10-CM

## 2013-12-05 ENCOUNTER — Ambulatory Visit: Payer: Medicare Other | Admitting: Physical Therapy

## 2013-12-12 ENCOUNTER — Ambulatory Visit: Payer: Medicare Other | Admitting: Physical Therapy

## 2013-12-19 ENCOUNTER — Ambulatory Visit: Payer: Medicare Other | Admitting: Physical Therapy

## 2013-12-27 ENCOUNTER — Ambulatory Visit: Payer: Medicare Other | Admitting: Physical Therapy

## 2014-05-15 ENCOUNTER — Other Ambulatory Visit: Payer: Self-pay | Admitting: Gastroenterology

## 2014-05-15 DIAGNOSIS — R1084 Generalized abdominal pain: Secondary | ICD-10-CM

## 2014-05-15 DIAGNOSIS — R140 Abdominal distension (gaseous): Secondary | ICD-10-CM

## 2014-05-18 ENCOUNTER — Ambulatory Visit
Admission: RE | Admit: 2014-05-18 | Discharge: 2014-05-18 | Disposition: A | Discharge status: Home / Self Care | Payer: Medicare Other | Source: Ambulatory Visit | Attending: Gastroenterology | Admitting: Gastroenterology

## 2014-05-18 DIAGNOSIS — R1084 Generalized abdominal pain: Secondary | ICD-10-CM

## 2014-05-18 DIAGNOSIS — R140 Abdominal distension (gaseous): Secondary | ICD-10-CM

## 2014-06-02 ENCOUNTER — Encounter (HOSPITAL_COMMUNITY): Payer: Self-pay | Admitting: Pharmacist

## 2014-06-12 NOTE — H&P (Signed)
H&P 73yo postmenopausal female who presents for hysteroscopy, D&C, polypectomy due to an incidental endometrial polyp on [place]. The patient reports she was having persistent abdominal pain and bloating. An [place] was performed that showed a 46mmx7mm hyperechoic rounded abnormality within the uterine cavity, endometrium normal at 2mm. Patient reports no vaginal bleeding or spotting since going through menopause in the 90s. She reports no abnormal vaginal discharge, itching or burning. No urinary complaints. Patient has been on HRT therapy for several years with improvement in her vasomotor symptoms.   Current Medication:  Taking  Centrum Silver Tablet Chewable 1 tablet daily     Calcium 500-100-40 Tablet Chewable 1 tablet three times a day     Reclast 5 MG/100ML Solution as directed     Vitamin D 5000 Tablet 1 tablet daily     Alpha-Lipoic Acid 300 MG Capsule     Bi-Est 7 : 3 .7 MG Cream Apply 1 ml QD     Digestive Enzyme Capsule     Fish Oil 1200 MG Capsule 1 capsule Once a day     Supplement: , Notes: milk thistle     Magnesium Glycinate Plus 110 MG Capsule 1 capsule once a day     Estradiol 0.1 MG/GM Cream testosterone .  apply 3 times a week     Theanine . Capsule  1 capsule as needed     OTC curcumin 500 mg 1 tablet daily     Glucosamine Sulfate Capsule  1 capsule with a meal Once a day     Mylanta 200-200-20 MG/5ML Suspension 10 ml as needed     Support slippery elm  1 tablet as needed     Progesterone 100 MG tablet Once a day   Not-Taking/PRN  Fluticasone Propionate 50 MCG/ACT Suspension 2 sprays in each nostril Once a day     Retin-A Cream 1 application a pearl-sized amount to face in the evening Once a day     Sudafed Tablet 1 tablet as needed as needed     Saline Nasal Spray 0.65 % Solution 2 drops in each nostril as needed every 2 hrs     Zyrtec Allergy 10 MG Tablet 1 tablet as needed     GenTeal 0.3 % Solution as directed as needed     Valacyclovir HCl 500 MG Tablet 1 tablet daily     Medication List reviewed and reconciled with the patient   Medical History:   depression/anxiety     scoliosis-status post surgery, Dr. 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 Bosie Clos, 5 year followup     dentist, Dr. Melynda Ripple     derm Dr Danella Deis, has been released to primary care     arthritis     DJD     DDD     heart murmur   Allergies/Intolerance:   Erythromycin Ethylsuccinate - rash     Lanolin     Petrolatum - rash     Sulfacetamide Sodium - headache     Penicillin V-potassium - rash     Sertraline HCl: Side Effects - anxiety     egg yolk     sugar cane     almonds  peanuts   Gyn History:   Sexual activity not currently sexually active.  Periods : postmenopausal.  LMP 1994.  Denies Birth control.  Last pap smear date 07/26/13.  Last mammogram date 09/15/13.   OB History:   Number of pregnancies 2.  Pregnancy # 1 live birth, vaginal delivery.  Pregnancy # 2 live birth, vaginal delivery.   Surgical History:   Harrington rods 2/05     bilateral bunionectomies 6/07, 7/07     removal of vaginal and breast cysts     appendectomy     tonsillectomy     colonoscopy 2007   Hospitalization:   childbirth 72, 75     not in past yr 04/2014   Family History:   Father: deceased, CVA at 88,colon polyps, diagnosed with CVA    Mother: deceased, breast cancer at 71, CAD and CVA in 1s, diagnosed with CVA, CAD, Breast Ca    Paternal Grand Father: deceased    Paternal Grand Mother: deceased    Maternal Grand Father: deceased, CAD in late 50s, diagnosed with CAD    Maternal Grand Mother: deceased    Brother 1: alive, hyperchol    Sister 1: alive    1 brother(s) , 1  sister(s) .    neg for colon cancer or liver disease.  Social History:  General Tobacco use  cigarettes: Former smoker  Quit in year 1964  Pack-year Hx: .1  Tobacco history last updated 01/12/2014  no Smoking, no not now, in the past college.  no Alcohol.  no Recreational drug use.  Exercise: aerobics 3 times per week for 20-30 minutes, then walk 1 mile, stretching daily.  Occupation: retired Airline pilot.  Marital Status: single, divorced.  Children: Willaim Rayas.  Religion: [employer].  Seat belt use: yes.  ROS: CONSTITUTIONAL no Chills. no Fever. no Skin rash. CARDIOLOGY no Chest pain.  RESPIRATORY no Shortness of breath. no Cough.  GASTROENTEROLOGY no Abdominal pain. no Appetite change. no Change in bowel movements.  UROLOGY no Urinary frequency. no Urinary incontinence. no Urinary urgency.  FEMALE REPRODUCTIVE no Breast lumps or discharge. no Breast pain.  NEUROLOGY no Dizziness. no Headache.   O:   Vitals: Wt 127, Wt change 1 lb, Ht 62.75, BMI 22.67, Pulse sitting 72, BP sitting 134/65.       Examination:  General Examination:  GENERAL APPEARANCE alert, oriented, NAD, pleasant.  SKIN: normal, no rash.  LUNGS: clear to auscultation bilaterally, no wheezes, rhonchi, rales.  HEART: no murmurs, regular rate and rhythm.  ABDOMEN: no masses palpated, soft and not tender, no rebound, no guarding.  FEMALE GENITOURINARY: labia - unremarkable, narrowed introitus due to atrophic changes, vagina - pink moist mucosa, no lesions or abnormal discharge, cervix - no discharge or lesions or CMT, adnexa - no masses or tenderness, uterus - nontender and normal size on palpation.  EXTREMITIES: no edema present.    A/P: 73yo postmenopausal female who presents for hysteroscopy, D&C, polypectomy due to endometrial polyp.   -NPO -LR @ 125cc/hr -Gent/Clinda to OR -SCDs to OR -Risk, benefit and indications reviewed with patient- including risk of bleeding, infection and  injury like uterine perforation.  Questions and concerns addressed.  Myna Hidalgo, DO [phone] (pager) [phone] (office)

## 2014-06-13 ENCOUNTER — Encounter (HOSPITAL_COMMUNITY): Payer: Self-pay

## 2014-06-13 ENCOUNTER — Encounter (HOSPITAL_COMMUNITY)
Admission: RE | Admit: 2014-06-13 | Discharge: 2014-06-13 | Disposition: A | Discharge status: Home / Self Care | Payer: Medicare Other | Source: Ambulatory Visit | Attending: Obstetrics & Gynecology | Admitting: Obstetrics & Gynecology

## 2014-06-13 DIAGNOSIS — Z87891 Personal history of nicotine dependence: Secondary | ICD-10-CM | POA: Diagnosis not present

## 2014-06-13 DIAGNOSIS — M810 Age-related osteoporosis without current pathological fracture: Secondary | ICD-10-CM | POA: Diagnosis not present

## 2014-06-13 DIAGNOSIS — N840 Polyp of corpus uteri: Secondary | ICD-10-CM | POA: Diagnosis not present

## 2014-06-13 DIAGNOSIS — R011 Cardiac murmur, unspecified: Secondary | ICD-10-CM | POA: Diagnosis not present

## 2014-06-13 DIAGNOSIS — E559 Vitamin D deficiency, unspecified: Secondary | ICD-10-CM | POA: Diagnosis not present

## 2014-06-13 HISTORY — DX: Unspecified osteoarthritis, unspecified site: M19.90

## 2014-06-13 HISTORY — DX: Anxiety disorder, unspecified: F41.9

## 2014-06-13 HISTORY — DX: Cardiac murmur, unspecified: R01.1

## 2014-06-13 LAB — BASIC METABOLIC PANEL
ANION GAP: 10 (ref 5–15)
BUN: 16 mg/dL (ref 6–23)
CHLORIDE: 104 meq/L (ref 96–112)
CO2: 28 meq/L (ref 19–32)
Calcium: 9.8 mg/dL (ref 8.4–10.5)
Creatinine, Ser: 0.84 mg/dL (ref 0.50–1.10)
GFR calc Af Amer: 79 mL/min — ABNORMAL LOW (ref >90)
GFR calc non Af Amer: 68 mL/min — ABNORMAL LOW (ref >90)
Glucose, Bld: 87 mg/dL (ref 70–99)
POTASSIUM: 4.2 meq/L (ref 3.7–5.3)
SODIUM: 142 meq/L (ref 137–147)

## 2014-06-13 LAB — CBC
HEMATOCRIT: 43.6 % (ref 36.0–46.0)
HEMOGLOBIN: 14.7 g/dL (ref 12.0–15.0)
MCH: 31.3 pg (ref 26.0–34.0)
MCHC: 33.7 g/dL (ref 30.0–36.0)
MCV: 92.8 fL (ref 78.0–100.0)
Platelets: 236 10*3/uL (ref 150–400)
RBC: 4.7 MIL/uL (ref 3.87–5.11)
RDW: 13 % (ref 11.5–15.5)
WBC: 6.4 10*3/uL (ref 4.0–10.5)

## 2014-06-13 NOTE — Patient Instructions (Signed)
Your procedure is scheduled on: Thursday, June 15, 2014  Enter through the [employer] of [facility] at: 10:45 A.M.  Pick up the phone at the desk and dial [phone].  Call this number if you have problems the morning of surgery: [phone].  Remember: Do NOT eat food: AFTER MIDNIGHT WEDNESDAY Do NOT drink clear liquids after: AFTER 8:00 A.M. MORNING OF SURGERY Take these medicines the morning of surgery with a SIP OF WATER: NONE *STOP TAKING NATURAL SUPPLEMENTS AND HERBS  Do NOT wear jewelry (body piercing), metal hair clips/bobby pins, make-up, or nail polish. Do NOT wear lotions, powders, or perfumes.  You may wear deoderant. Do NOT shave for 48 hours prior to surgery. Do NOT bring valuables to the hospital. Contacts, dentures, or bridgework may not be worn into surgery.  Have a responsible adult drive you home and stay with you for 24 hours after your procedure

## 2014-06-14 MED ORDER — GENTAMICIN SULFATE 40 MG/ML IJ SOLN
INTRAVENOUS | Status: AC
  Administered 2014-06-15: 113 mL via INTRAVENOUS
  Filled 2014-06-14: qty 7

## 2014-06-15 ENCOUNTER — Ambulatory Visit (HOSPITAL_COMMUNITY): Payer: Medicare Other | Admitting: Certified Registered Nurse Anesthetist

## 2014-06-15 ENCOUNTER — Encounter (HOSPITAL_COMMUNITY): Payer: Medicare Other | Admitting: Certified Registered Nurse Anesthetist

## 2014-06-15 ENCOUNTER — Encounter (HOSPITAL_COMMUNITY): Payer: Self-pay | Admitting: Anesthesiology

## 2014-06-15 ENCOUNTER — Encounter (HOSPITAL_COMMUNITY)
Admission: RE | Disposition: A | Discharge status: Home / Self Care | Payer: Self-pay | Source: Ambulatory Visit | Attending: Obstetrics & Gynecology

## 2014-06-15 ENCOUNTER — Ambulatory Visit (HOSPITAL_COMMUNITY)
Admission: RE | Admit: 2014-06-15 | Discharge: 2014-06-15 | Disposition: A | Discharge status: Home / Self Care | Payer: Medicare Other | Source: Ambulatory Visit | Attending: Obstetrics & Gynecology | Admitting: Obstetrics & Gynecology

## 2014-06-15 DIAGNOSIS — M810 Age-related osteoporosis without current pathological fracture: Secondary | ICD-10-CM | POA: Diagnosis not present

## 2014-06-15 DIAGNOSIS — E559 Vitamin D deficiency, unspecified: Secondary | ICD-10-CM | POA: Insufficient documentation

## 2014-06-15 DIAGNOSIS — R011 Cardiac murmur, unspecified: Secondary | ICD-10-CM | POA: Diagnosis not present

## 2014-06-15 DIAGNOSIS — N840 Polyp of corpus uteri: Secondary | ICD-10-CM | POA: Diagnosis not present

## 2014-06-15 DIAGNOSIS — Z87891 Personal history of nicotine dependence: Secondary | ICD-10-CM | POA: Insufficient documentation

## 2014-06-15 HISTORY — PX: HYSTEROSCOPY WITH D & C: SHX1775

## 2014-06-15 SURGERY — DILATATION AND CURETTAGE /HYSTEROSCOPY
Anesthesia: General | Site: Vagina

## 2014-06-15 MED ORDER — ONDANSETRON HCL 4 MG/2ML IJ SOLN
INTRAMUSCULAR | Status: AC
  Filled 2014-06-15: qty 2

## 2014-06-15 MED ORDER — LIDOCAINE HCL (CARDIAC) 20 MG/ML IV SOLN
INTRAVENOUS | Status: AC
  Filled 2014-06-15: qty 5

## 2014-06-15 MED ORDER — SCOPOLAMINE 1 MG/3DAYS TD PT72: 1.0000 | MEDICATED_PATCH | Freq: Once | TRANSDERMAL | Status: DC

## 2014-06-15 MED ORDER — LACTATED RINGERS IV SOLN
INTRAVENOUS | Status: DC
  Administered 2014-06-15 (×3): via INTRAVENOUS

## 2014-06-15 MED ORDER — KETOROLAC TROMETHAMINE 30 MG/ML IJ SOLN
INTRAMUSCULAR | Status: AC
  Filled 2014-06-15: qty 1

## 2014-06-15 MED ORDER — FENTANYL CITRATE 0.05 MG/ML IJ SOLN
INTRAMUSCULAR | Status: AC
  Administered 2014-06-15: 25 ug via INTRAVENOUS
  Filled 2014-06-15: qty 2

## 2014-06-15 MED ORDER — MIDAZOLAM HCL 2 MG/2ML IJ SOLN
INTRAMUSCULAR | Status: DC | PRN
  Administered 2014-06-15: 1 mg via INTRAVENOUS

## 2014-06-15 MED ORDER — SILVER NITRATE-POT NITRATE 75-25 % EX MISC
CUTANEOUS | Status: AC
  Filled 2014-06-15: qty 3

## 2014-06-15 MED ORDER — FENTANYL CITRATE 0.05 MG/ML IJ SOLN
INTRAMUSCULAR | Status: AC
  Filled 2014-06-15: qty 2

## 2014-06-15 MED ORDER — METOCLOPRAMIDE HCL 5 MG/ML IJ SOLN
INTRAMUSCULAR | Status: AC
  Filled 2014-06-15: qty 2

## 2014-06-15 MED ORDER — FENTANYL CITRATE 0.05 MG/ML IJ SOLN
INTRAMUSCULAR | Status: DC | PRN
  Administered 2014-06-15 (×2): 25 ug via INTRAVENOUS
  Administered 2014-06-15: 50 ug via INTRAVENOUS

## 2014-06-15 MED ORDER — DEXAMETHASONE SODIUM PHOSPHATE 4 MG/ML IJ SOLN
INTRAMUSCULAR | Status: AC
  Filled 2014-06-15: qty 1

## 2014-06-15 MED ORDER — METOCLOPRAMIDE HCL 5 MG/ML IJ SOLN
INTRAMUSCULAR | Status: DC | PRN
  Administered 2014-06-15: 10 mg via INTRAVENOUS

## 2014-06-15 MED ORDER — MEPERIDINE HCL 25 MG/ML IJ SOLN: 6.2500 mg | INTRAMUSCULAR | Status: DC | PRN

## 2014-06-15 MED ORDER — DEXAMETHASONE SODIUM PHOSPHATE 10 MG/ML IJ SOLN
INTRAMUSCULAR | Status: DC | PRN
  Administered 2014-06-15: 4 mg via INTRAVENOUS

## 2014-06-15 MED ORDER — LACTATED RINGERS IV SOLN
INTRAVENOUS | Status: DC
  Administered 2014-06-15: 11:00:00 via INTRAVENOUS

## 2014-06-15 MED ORDER — PHENYLEPHRINE 40 MCG/ML (10ML) SYRINGE FOR IV PUSH (FOR BLOOD PRESSURE SUPPORT)
PREFILLED_SYRINGE | INTRAVENOUS | Status: AC
  Filled 2014-06-15: qty 5

## 2014-06-15 MED ORDER — FENTANYL CITRATE 0.05 MG/ML IJ SOLN
25.0000 ug | INTRAMUSCULAR | Status: DC | PRN
  Administered 2014-06-15: 25 ug via INTRAVENOUS
  Administered 2014-06-15: 50 ug via INTRAVENOUS

## 2014-06-15 MED ORDER — PHENYLEPHRINE HCL 10 MG/ML IJ SOLN
INTRAMUSCULAR | Status: DC | PRN
  Administered 2014-06-15 (×4): 40 ug via INTRAVENOUS
  Administered 2014-06-15 (×2): 80 ug via INTRAVENOUS
  Administered 2014-06-15: 40 ug via INTRAVENOUS

## 2014-06-15 MED ORDER — LIDOCAINE HCL (CARDIAC) 20 MG/ML IV SOLN
INTRAVENOUS | Status: DC | PRN
  Administered 2014-06-15: 80 mg via INTRAVENOUS

## 2014-06-15 MED ORDER — PROPOFOL 10 MG/ML IV EMUL
INTRAVENOUS | Status: AC
  Filled 2014-06-15: qty 20

## 2014-06-15 MED ORDER — PROPOFOL 10 MG/ML IV BOLUS
INTRAVENOUS | Status: DC | PRN
  Administered 2014-06-15: 120 mg via INTRAVENOUS

## 2014-06-15 MED ORDER — MIDAZOLAM HCL 2 MG/2ML IJ SOLN
INTRAMUSCULAR | Status: AC
  Filled 2014-06-15: qty 2

## 2014-06-15 MED ORDER — SODIUM CHLORIDE 0.9 % IR SOLN
Status: DC | PRN
  Administered 2014-06-15: 3000 mL

## 2014-06-15 MED ORDER — METOCLOPRAMIDE HCL 5 MG/ML IJ SOLN: 10.0000 mg | Freq: Once | INTRAMUSCULAR | Status: DC | PRN

## 2014-06-15 MED ORDER — ONDANSETRON HCL 4 MG/2ML IJ SOLN
INTRAMUSCULAR | Status: DC | PRN
  Administered 2014-06-15: 4 mg via INTRAVENOUS

## 2014-06-15 SURGICAL SUPPLY — 17 items
CANISTER SUCT 3000ML (MISCELLANEOUS) ×3 IMPLANT
CATH ROBINSON RED A/P 16FR (CATHETERS) ×3 IMPLANT
CLOTH BEACON ORANGE TIMEOUT ST (SAFETY) ×3 IMPLANT
CONTAINER PREFILL 10% NBF 60ML (FORM) ×6 IMPLANT
DILATOR CANAL MILEX (MISCELLANEOUS) IMPLANT
DRAPE HYSTEROSCOPY (DRAPE) ×3 IMPLANT
GLOVE BIOGEL PI IND STRL 6.5 (GLOVE) ×2 IMPLANT
GLOVE BIOGEL PI INDICATOR 6.5 (GLOVE) ×4
GLOVE ECLIPSE 6.5 STRL STRAW (GLOVE) ×3 IMPLANT
GOWN STRL REUS W/TWL LRG LVL3 (GOWN DISPOSABLE) ×6 IMPLANT
LOOP ANGLED CUTTING 22FR (CUTTING LOOP) ×2 IMPLANT
PACK VAGINAL MINOR WOMEN LF (CUSTOM PROCEDURE TRAY) ×3 IMPLANT
PAD OB MATERNITY 4.3X12.25 (PERSONAL CARE ITEMS) ×3 IMPLANT
SET TUBING HYSTEROSCOPY 2 NDL (TUBING) ×2 IMPLANT
TOWEL OR 17X24 6PK STRL BLUE (TOWEL DISPOSABLE) ×6 IMPLANT
TUBE HYSTEROSCOPY W Y-CONNECT (TUBING) ×2 IMPLANT
WATER STERILE IRR 1000ML POUR (IV SOLUTION) ×3 IMPLANT

## 2014-06-15 NOTE — Transfer of Care (Signed)
Immediate Anesthesia Transfer of Care Note  Patient: Lynn Kim  Procedure(s) Performed: Procedure(s) with comments: DILATATION AND CURETTAGE /HYSTEROSCOPY (N/A) - w/Polypectomy  Patient Location: PACU  Anesthesia Type:General  Level of Consciousness: awake, alert , oriented and patient cooperative  Airway & Oxygen Therapy: Patient Spontanous Breathing and Patient connected to nasal cannula oxygen  Post-op Assessment: Report given to PACU RN and Post -op Vital signs reviewed and stable  Post vital signs: Reviewed and stable  Complications: No apparent anesthesia complications

## 2014-06-15 NOTE — Anesthesia Preprocedure Evaluation (Signed)
Anesthesia Evaluation  Patient identified by MRN, date of birth, ID band Patient awake    Reviewed: Allergy & Precautions, H&P , NPO status , Patient's Chart, lab work & pertinent test results  Airway Mallampati: II TM Distance: >3 FB Neck ROM: Full    Dental no notable dental hx. (+) Teeth Intact   Pulmonary former smoker,  breath sounds clear to auscultation  Pulmonary exam normal       Cardiovascular negative cardio ROS  + Valvular Problems/Murmurs Rhythm:Regular Rate:Normal     Neuro/Psych PSYCHIATRIC DISORDERS Anxiety Depression vertigo negative neurological ROS     GI/Hepatic negative GI ROS,   Endo/Other  negative endocrine ROS  Renal/GU negative Renal ROS  negative genitourinary   Musculoskeletal  (+) Arthritis -, Osteoarthritis,    Abdominal (+) - obese,   Peds  Hematology negative hematology ROS (+)   Anesthesia Other Findings   Reproductive/Obstetrics Endometrial polyp                           Anesthesia Physical Anesthesia Plan  ASA: II  Anesthesia Plan: General   Post-op Pain Management:    Induction: Intravenous  Airway Management Planned: LMA  Additional Equipment:   Intra-op Plan:   Post-operative Plan: Extubation in OR  Informed Consent: I have reviewed the patients History and Physical, chart, labs and discussed the procedure including the risks, benefits and alternatives for the proposed anesthesia with the patient or authorized representative who has indicated his/her understanding and acceptance.   Dental advisory given  Plan Discussed with: Anesthesiologist, CRNA and Surgeon  Anesthesia Plan Comments:         Anesthesia Quick Evaluation

## 2014-06-15 NOTE — Op Note (Signed)
Operative Report  PreOp: Endometrial polyp PostOp: same Procedure:  Hysteroscopy, Dilation and Curettage, polypectomy Surgeon: Dr. Myna Hidalgo Anesthesia: General Complications:none EBL: 5cc UOP: 75cc IVF:1000cc  Findings: 6wk sized anteverted uterus, thin endometrium with 1cm uterine polyp noted on the posterior wall of the uterus  Specimens: 1) Uterine polyp 2) Endometrial curettings  Indications: 73yo postmenopausal female who presents for hysteroscopy, D&C, polypectomy due to a 71mmx7mm hyperechoic rounded abnormality within the uterine cavity noted on ultrasound.  She denies vaginal bleeding or discharge.  Ultrasound was performed due to abdominal bloating and discomfort.   Procedure: The patient was taken to the operating room where she underwent general anesthesia without difficulty. The patient was placed in a low lithotomy position using Allen stirrups. She was prepped and draped in the normal sterile fashion. The bladder was drained using a red rubber urethral catheter. A sterile speculum was inserted into the vagina. A single tooth tenaculum was placed on the anterior lip of the cervix. The uterus was then sounded to 6cm. The endocervical canal was then serially dilated to 14French using Hank dilators.  The diagnostic hysteroscope was then inserted without difficulty and noted to have the findings as listed above.  The resectoscope was used to remove the polyp.  The scope was removed and sharp curettage was performed.  The hysteroscope was re-inserted, complete removal of the polyp was appreciated and no uterine perforation was noted.  All instrument were then removed. Hemostasis was observed at the cervical site. The patient was repositioned to the supine position. The patient tolerated the procedure without any complications and taken to recovery in stable condition.   Myna Hidalgo, DO [phone] (pager) [phone] (office)

## 2014-06-15 NOTE — Discharge Instructions (Signed)
HOME INSTRUCTIONS  Please note any unusual or excessive bleeding, pain, swelling. Mild dizziness or drowsiness are normal for about 24 hours after surgery.   Shower when comfortable  Restrictions: No driving for 24 hours or while taking pain medications.  Activity:  No heavy lifting (> 10 lbs), nothing in vagina (no tampons, douching, or intercourse) x 2 weeks; no tub baths for 2 weeks Vaginal spotting is expected but if your bleeding is heavy, period like,  please call the office   Diet:  You may eat whatever you want.  Do not eat large meals.  Eat small frequent meals throughout the day.  Continue to drink a good amount of water at least 6-8 glasses of water per day, hydration is very important for the healing process.  Pain Management: Take Motrin, Tylenol or Aspirin as needed for pain management.  Always take prescription pain medication with food, it may cause constipation, increase fluids and fiber and you may want to take an over-the-counter stool softener like Colace as needed up to 2x a day.    Alcohol -- Avoid for 24 hours and while taking pain medications.  Nausea: Take sips of ginger ale or soda  Fever -- Call physician if temperature over 101 degrees  Follow up:  If you do not already have a follow up appointment scheduled, please call the office at [phone].  If you experience fever (a temperature greater than 100.4), pain unrelieved by pain medication, shortness of breath, swelling of a single leg, or any other symptoms which are concerning to you please the office immediately.

## 2014-06-15 NOTE — Interval H&P Note (Signed)
History and Physical Interval Note:  06/15/2014 10:49 AM  Lynn Kim  has presented today for surgery, with the diagnosis of Endometrial Polyp  The various methods of treatment have been discussed with the patient and family. After consideration of risks, benefits and other options for treatment, the patient has consented to  Procedure(s) with comments: DILATATION AND CURETTAGE /HYSTEROSCOPY (N/A) - w/Polypectomy as a surgical intervention .  The patient's history has been reviewed, patient examined, no change in status, stable for surgery.  I have reviewed the patient's chart and labs.  Questions were answered to the patient's satisfaction.     Myna Hidalgo, M

## 2014-06-15 NOTE — Anesthesia Postprocedure Evaluation (Signed)
Anesthesia Post Note  Patient: Lynn Kim  Procedure(s) Performed: Procedure(s) (LRB): DILATATION AND CURETTAGE /HYSTEROSCOPY (N/A)  Anesthesia type: General  Patient location: PACU  Post pain: Pain level controlled  Post assessment: Post-op Vital signs reviewed  Last Vitals:  Filed Vitals:   06/15/14 1330  BP: 109/57  Pulse: 81  Temp:   Resp: 17    Post vital signs: Reviewed  Level of consciousness: sedated  Complications: No apparent anesthesia complications

## 2014-06-16 ENCOUNTER — Encounter (HOSPITAL_COMMUNITY): Payer: Self-pay | Admitting: Obstetrics & Gynecology

## 2014-07-04 ENCOUNTER — Ambulatory Visit
Admission: RE | Admit: 2014-07-04 | Discharge: 2014-07-04 | Disposition: A | Discharge status: Home / Self Care | Payer: Medicare Other | Source: Ambulatory Visit | Attending: Family Medicine | Admitting: Family Medicine

## 2014-07-04 ENCOUNTER — Other Ambulatory Visit: Payer: Self-pay | Admitting: Family Medicine

## 2014-07-04 DIAGNOSIS — M25531 Pain in right wrist: Secondary | ICD-10-CM

## 2014-07-04 DIAGNOSIS — M25532 Pain in left wrist: Secondary | ICD-10-CM

## 2014-08-08 ENCOUNTER — Ambulatory Visit: Payer: Medicare Other | Attending: Physical Therapy | Admitting: Physical Therapy

## 2014-08-08 DIAGNOSIS — M25531 Pain in right wrist: Secondary | ICD-10-CM | POA: Insufficient documentation

## 2014-08-08 DIAGNOSIS — M25532 Pain in left wrist: Secondary | ICD-10-CM | POA: Diagnosis not present

## 2014-08-08 DIAGNOSIS — Z5189 Encounter for other specified aftercare: Secondary | ICD-10-CM | POA: Insufficient documentation

## 2014-08-15 ENCOUNTER — Ambulatory Visit: Payer: Medicare Other | Admitting: Physical Therapy

## 2014-08-15 DIAGNOSIS — Z5189 Encounter for other specified aftercare: Secondary | ICD-10-CM | POA: Diagnosis not present

## 2014-08-29 ENCOUNTER — Ambulatory Visit: Payer: Medicare Other | Attending: Family Medicine | Admitting: Physical Therapy

## 2014-08-29 DIAGNOSIS — Z5189 Encounter for other specified aftercare: Secondary | ICD-10-CM | POA: Diagnosis present

## 2014-08-29 DIAGNOSIS — M25532 Pain in left wrist: Secondary | ICD-10-CM | POA: Insufficient documentation

## 2014-08-29 DIAGNOSIS — M25531 Pain in right wrist: Secondary | ICD-10-CM | POA: Diagnosis not present

## 2014-09-07 ENCOUNTER — Ambulatory Visit: Payer: Medicare Other | Admitting: Physical Therapy

## 2014-09-07 DIAGNOSIS — Z5189 Encounter for other specified aftercare: Secondary | ICD-10-CM | POA: Diagnosis not present

## 2014-09-26 ENCOUNTER — Ambulatory Visit: Payer: Medicare Other | Admitting: Physical Therapy

## 2014-09-26 DIAGNOSIS — Z5189 Encounter for other specified aftercare: Secondary | ICD-10-CM | POA: Diagnosis not present

## 2014-11-15 ENCOUNTER — Other Ambulatory Visit (HOSPITAL_COMMUNITY): Payer: Self-pay | Admitting: Family Medicine

## 2014-11-15 DIAGNOSIS — Z1231 Encounter for screening mammogram for malignant neoplasm of breast: Secondary | ICD-10-CM

## 2014-11-21 ENCOUNTER — Ambulatory Visit (HOSPITAL_COMMUNITY)
Admission: RE | Admit: 2014-11-21 | Discharge: 2014-11-21 | Disposition: A | Discharge status: Home / Self Care | Payer: Medicare Other | Source: Ambulatory Visit | Attending: Family Medicine | Admitting: Family Medicine

## 2014-11-21 DIAGNOSIS — Z1231 Encounter for screening mammogram for malignant neoplasm of breast: Secondary | ICD-10-CM | POA: Diagnosis not present

## 2014-11-29 ENCOUNTER — Emergency Department (HOSPITAL_COMMUNITY): Payer: Medicare Other

## 2014-11-29 ENCOUNTER — Observation Stay (HOSPITAL_COMMUNITY)
Admission: EM | Admit: 2014-11-29 | Discharge: 2014-11-30 | Disposition: A | Discharge status: Home / Self Care | Payer: Medicare Other | Attending: Internal Medicine | Admitting: Internal Medicine

## 2014-11-29 ENCOUNTER — Encounter (HOSPITAL_COMMUNITY): Payer: Self-pay | Admitting: Emergency Medicine

## 2014-11-29 DIAGNOSIS — Z87891 Personal history of nicotine dependence: Secondary | ICD-10-CM | POA: Diagnosis not present

## 2014-11-29 DIAGNOSIS — R61 Generalized hyperhidrosis: Secondary | ICD-10-CM | POA: Diagnosis not present

## 2014-11-29 DIAGNOSIS — M1990 Unspecified osteoarthritis, unspecified site: Secondary | ICD-10-CM | POA: Insufficient documentation

## 2014-11-29 DIAGNOSIS — Z79899 Other long term (current) drug therapy: Secondary | ICD-10-CM | POA: Diagnosis not present

## 2014-11-29 DIAGNOSIS — F419 Anxiety disorder, unspecified: Secondary | ICD-10-CM | POA: Diagnosis not present

## 2014-11-29 DIAGNOSIS — Z880 Allergy status to penicillin: Secondary | ICD-10-CM | POA: Insufficient documentation

## 2014-11-29 DIAGNOSIS — M549 Dorsalgia, unspecified: Secondary | ICD-10-CM | POA: Diagnosis not present

## 2014-11-29 DIAGNOSIS — F329 Major depressive disorder, single episode, unspecified: Secondary | ICD-10-CM | POA: Insufficient documentation

## 2014-11-29 DIAGNOSIS — R110 Nausea: Secondary | ICD-10-CM | POA: Insufficient documentation

## 2014-11-29 DIAGNOSIS — R011 Cardiac murmur, unspecified: Secondary | ICD-10-CM | POA: Insufficient documentation

## 2014-11-29 DIAGNOSIS — R0789 Other chest pain: Secondary | ICD-10-CM

## 2014-11-29 DIAGNOSIS — Z9889 Other specified postprocedural states: Secondary | ICD-10-CM | POA: Diagnosis not present

## 2014-11-29 DIAGNOSIS — R1013 Epigastric pain: Secondary | ICD-10-CM | POA: Diagnosis not present

## 2014-11-29 DIAGNOSIS — R079 Chest pain, unspecified: Principal | ICD-10-CM | POA: Diagnosis present

## 2014-11-29 DIAGNOSIS — M810 Age-related osteoporosis without current pathological fracture: Secondary | ICD-10-CM | POA: Insufficient documentation

## 2014-11-29 LAB — HEPATIC FUNCTION PANEL
ALK PHOS: 47 U/L (ref 39–117)
ALT: 28 U/L (ref 0–35)
AST: 38 U/L — ABNORMAL HIGH (ref 0–37)
Albumin: 3.7 g/dL (ref 3.5–5.2)
BILIRUBIN INDIRECT: 0.6 mg/dL (ref 0.3–0.9)
BILIRUBIN TOTAL: 0.7 mg/dL (ref 0.3–1.2)
Bilirubin, Direct: 0.1 mg/dL (ref 0.0–0.5)
Total Protein: 6 g/dL (ref 6.0–8.3)

## 2014-11-29 LAB — BASIC METABOLIC PANEL
ANION GAP: 3 — AB (ref 5–15)
BUN: 15 mg/dL (ref 6–23)
CHLORIDE: 106 mmol/L (ref 96–112)
CO2: 31 mmol/L (ref 19–32)
Calcium: 9.7 mg/dL (ref 8.4–10.5)
Creatinine, Ser: 0.86 mg/dL (ref 0.50–1.10)
GFR calc Af Amer: 77 mL/min — ABNORMAL LOW (ref >90)
GFR, EST NON AFRICAN AMERICAN: 66 mL/min — AB (ref >90)
GLUCOSE: 107 mg/dL — AB (ref 70–99)
POTASSIUM: 3.8 mmol/L (ref 3.5–5.1)
Sodium: 140 mmol/L (ref 135–145)

## 2014-11-29 LAB — CBC
HCT: 43.5 % (ref 36.0–46.0)
Hemoglobin: 14.6 g/dL (ref 12.0–15.0)
MCH: 31.1 pg (ref 26.0–34.0)
MCHC: 33.6 g/dL (ref 30.0–36.0)
MCV: 92.8 fL (ref 78.0–100.0)
Platelets: 217 10*3/uL (ref 150–400)
RBC: 4.69 MIL/uL (ref 3.87–5.11)
RDW: 13 % (ref 11.5–15.5)
WBC: 8.4 10*3/uL (ref 4.0–10.5)

## 2014-11-29 LAB — I-STAT TROPONIN, ED: TROPONIN I, POC: 0 ng/mL (ref 0.00–0.08)

## 2014-11-29 LAB — TROPONIN I

## 2014-11-29 LAB — LIPASE, BLOOD: LIPASE: 50 U/L (ref 11–59)

## 2014-11-29 MED ORDER — DIGESTIVE ENZYME PO CAPS: 1.0000 | ORAL_CAPSULE | Freq: Two times a day (BID) | ORAL | Status: DC

## 2014-11-29 MED ORDER — PANCRELIPASE (LIP-PROT-AMYL) 12000-38000 UNITS PO CPEP
12000.0000 [IU] | ORAL_CAPSULE | Freq: Two times a day (BID) | ORAL | Status: DC
  Administered 2014-11-30: 12000 [IU] via ORAL
  Filled 2014-11-29: qty 1

## 2014-11-29 MED ORDER — ACETAMINOPHEN 325 MG PO TABS: 650.0000 mg | ORAL_TABLET | ORAL | Status: DC | PRN

## 2014-11-29 MED ORDER — ALIGN PO CAPS: 1.0000 | ORAL_CAPSULE | Freq: Every day | ORAL | Status: DC

## 2014-11-29 MED ORDER — GI COCKTAIL ~~LOC~~: 30.0000 mL | Freq: Four times a day (QID) | ORAL | Status: DC | PRN

## 2014-11-29 MED ORDER — RISAQUAD PO CAPS
1.0000 | ORAL_CAPSULE | Freq: Every day | ORAL | Status: DC
  Administered 2014-11-30: 1 via ORAL
  Filled 2014-11-29 (×2): qty 1

## 2014-11-29 MED ORDER — MORPHINE SULFATE 2 MG/ML IJ SOLN: 2.0000 mg | INTRAMUSCULAR | Status: DC | PRN

## 2014-11-29 MED ORDER — CALCIUM CARBONATE ANTACID 500 MG PO CHEW
1.0000 | CHEWABLE_TABLET | Freq: Two times a day (BID) | ORAL | Status: DC | PRN
Start: 2014-11-29 — End: 2014-11-30
  Filled 2014-11-29: qty 2

## 2014-11-29 MED ORDER — ONDANSETRON HCL 4 MG/2ML IJ SOLN: 4.0000 mg | Freq: Four times a day (QID) | INTRAMUSCULAR | Status: DC | PRN

## 2014-11-29 MED ORDER — ASPIRIN EC 325 MG PO TBEC
325.0000 mg | DELAYED_RELEASE_TABLET | Freq: Every day | ORAL | Status: DC
Start: 2014-11-30 — End: 2014-11-30

## 2014-11-29 MED ORDER — ENOXAPARIN SODIUM 40 MG/0.4ML ~~LOC~~ SOLN
40.0000 mg | SUBCUTANEOUS | Status: DC
  Filled 2014-11-29: qty 0.4

## 2014-11-29 MED ORDER — NITROGLYCERIN 0.4 MG SL SUBL: 0.4000 mg | SUBLINGUAL_TABLET | SUBLINGUAL | Status: DC | PRN

## 2014-11-29 NOTE — ED Provider Notes (Signed)
CSN: [id number]     Arrival date & time 11/29/14  1144 History   First MD Initiated Contact with Patient 11/29/14 1205     Chief Complaint  Patient presents with  . Chest Pain     (Consider location/radiation/quality/duration/timing/severity/associated sxs/prior Treatment) Patient is a 74 y.o. female presenting with chest pain. The history is provided by the patient.  Chest Pain Associated symptoms: abdominal pain, back pain, diaphoresis and nausea   Associated symptoms: no fever, no shortness of breath and not vomiting    patient brought in by EMS. [facility] 10 minutes after finish the work out she has epigastric discomfort is rated 8 up into the lower part of the chest into the back. This lasted 30 minutes or longer. Associated with lightheadedness nausea feeling clammy. The pain was described as a pressure and not severe but very different than anything she's had before. Patient's had prior workup for abdominal problems that were negative. This was not like that. Patient without a known cardiac history. Patient took aspirin at home. Shortly after getting in the EMS truck symptoms started to resolve. Patient is completely asymptomatic currently.  Past Medical History  Diagnosis Date  . Osteoporosis   . Vertigo   . Heart murmur     CHILDHOOD FUNCTIONAL HEART MURMUR  . Anxiety     PAST HISTORY  . Depression     genetic gene  . Arthritis     bilateral hands   Past Surgical History  Procedure Laterality Date  . Back surgery    . Tonsillectomy    . Appendectomy    . Bunionectomy    . Left breast cyst removal    . Hysteroscopy w/d&c N/A 06/15/2014    Procedure: DILATATION AND CURETTAGE /HYSTEROSCOPY;  Surgeon: Sharon SellerJennifer M Ozan, DO;  Location: [facility];  Service: Gynecology;  Laterality: N/A;  w/Polypectomy   No family history on file. History  Substance Use Topics  . Smoking status: Former Games developermoker  . Smokeless tobacco: Former NeurosurgeonUser    Quit date:  04/06/1963  . Alcohol Use: Yes     Comment: occasionally   OB History    No data available     Review of Systems  Constitutional: Positive for diaphoresis. Negative for fever.  HENT: Negative for congestion.   Eyes: Negative for redness.  Respiratory: Negative for shortness of breath.   Cardiovascular: Positive for chest pain.  Gastrointestinal: Positive for nausea and abdominal pain. Negative for vomiting.  Genitourinary: Negative for dysuria.  Musculoskeletal: Positive for back pain.  Skin: Negative for rash.  Neurological: Positive for light-headedness.  Hematological: Does not bruise/bleed easily.  Psychiatric/Behavioral: Negative for confusion.      Allergies  Eggs or egg-derived products; Food; Erythromycin ethylsuccinate; Lanolin; Penicillins; Petrolatum; Sertraline hcl; and Sulfa antibiotics  Home Medications   Prior to Admission medications   Medication Sig Start Date End Date Taking? Authorizing Provider  Alpha Lipoic Acid 200 MG CAPS Take 1 capsule by mouth 3 (three) times daily.   Yes Historical Provider, MD  CALCIUM-MAGNESIUM-VITAMIN D PO Take 1 tablet by mouth 2 (two) times daily.   Yes Historical Provider, MD  Calcium-Vitamin D-Vitamin K (VIACTIV) 500-500-40 MG-UNT-MCG CHEW Chew 1 tablet by mouth daily with lunch.   Yes Historical Provider, MD  Cholecalciferol (VITAMIN D3) 2000 UNITS TABS Take 1 tablet by mouth 2 (two) times daily.   Yes Historical Provider, MD  Digestive Enzymes (DIGESTIVE ENZYME PO) Take 1 tablet by mouth  2 (two) times daily before lunch and supper.   Yes Historical Provider, MD  Glucosamine Sulfate 1000 MG TABS Take 1 tablet by mouth 2 (two) times daily.   Yes Historical Provider, MD  loratadine (CLARITIN) 10 MG tablet Take 10 mg by mouth daily.    Yes Historical Provider, MD  MAGNESIUM GLYCINATE PLUS PO Take 200 mg by mouth at bedtime.   Yes Historical Provider, MD  Multiple Vitamin (MULTIVITAMIN) tablet Take 1 tablet by mouth daily.   Yes  Historical Provider, MD  Nutritional Supplements (DHEA PO) Take 5 mg by mouth daily.   Yes Historical Provider, MD  Omega-3 Fatty Acids (FISH OIL) 500 MG CAPS Take 1 capsule by mouth 2 (two) times daily.   Yes Historical Provider, MD  PRESCRIPTION MEDICATION Apply 1 application topically daily. Bi-est cream - compounded at [facility]   Yes Historical Provider, MD  PRESCRIPTION MEDICATION Apply 1 application topically 3 (three) times a week. Estriol - Testosterone Cream - three times weekly (Sunday, Tuesday, Friday) Compounded at [facility]   Yes Historical Provider, MD  PRESCRIPTION MEDICATION Take 1 capsule by mouth daily. Progesterone SR 100 mg capsules - compounded at [facility]   Yes Historical Provider, MD  Probiotic Product (ALIGN PO) Take 1 capsule by mouth 1 day or 1 dose.   Yes Historical Provider, MD  pseudoephedrine (SUDAFED) 30 MG tablet Take 30 mg by mouth every 4 (four) hours as needed for congestion.    Yes Historical Provider, MD  Turmeric Curcumin 500 MG CAPS Take 1 capsule by mouth daily.   Yes Historical Provider, MD   BP 133/57 mmHg  Pulse 83  Temp(Src) 97.8 F (36.6 C) (Oral)  Resp 23  Ht  (1.6 m)  Wt 126 lb (57.153 kg)  BMI 22.33 kg/m2  SpO2 97% Physical Exam  Constitutional: She is oriented to person, place, and time. She appears well-developed and well-nourished. No distress.  HENT:  Head: Normocephalic and atraumatic.  Mouth/Throat: Oropharynx is clear and moist.  Eyes: Conjunctivae and EOM are normal. Pupils are equal, round, and reactive to light.  Neck: Normal range of motion. Neck supple.  Cardiovascular: Normal rate, regular rhythm and normal heart sounds.   Pulmonary/Chest: Effort normal and breath sounds normal. No respiratory distress.  Abdominal: Soft. Bowel sounds are normal. There is no tenderness.  Musculoskeletal: Normal range of motion.  Neurological: She is alert and oriented to person, place, and  time. No cranial nerve deficit. She exhibits normal muscle tone. Coordination normal.  Skin: Skin is warm. No rash noted.  Nursing note and vitals reviewed.   ED Course  Procedures (including critical care time) Labs Review Labs Reviewed  BASIC METABOLIC PANEL - Abnormal; Notable for the following:    Glucose, Bld 107 (*)    GFR calc non Af Amer 66 (*)    GFR calc Af Amer 77 (*)    Anion gap 3 (*)    All other components within normal limits  HEPATIC FUNCTION PANEL - Abnormal; Notable for the following:    AST 38 (*)    All other components within normal limits  CBC  LIPASE, BLOOD  I-STAT TROPOININ, ED   Results for orders placed or performed during the hospital encounter of 11/29/14  CBC  Result Value Ref Range   WBC 8.4 4.0 - 10.5 K/uL   RBC 4.69 3.87 - 5.11 MIL/uL   Hemoglobin 14.6 12.0 - 15.0 g/dL   HCT 13.2 44.0 - 10.2 %  MCV 92.8 78.0 - 100.0 fL   MCH 31.1 26.0 - 34.0 pg   MCHC 33.6 30.0 - 36.0 g/dL   RDW 96.0 45.4 - 09.8 %   Platelets 217 150 - 400 K/uL  Basic metabolic panel  Result Value Ref Range   Sodium 140 135 - 145 mmol/L   Potassium 3.8 3.5 - 5.1 mmol/L   Chloride 106 96 - 112 mmol/L   CO2 31 19 - 32 mmol/L   Glucose, Bld 107 (H) 70 - 99 mg/dL   BUN 15 6 - 23 mg/dL   Creatinine, Ser 1.19 0.50 - 1.10 mg/dL   Calcium 9.7 8.4 - 14.7 mg/dL   GFR calc non Af Amer 66 (L) >90 mL/min   GFR calc Af Amer 77 (L) >90 mL/min   Anion gap 3 (L) 5 - 15  Lipase, blood  Result Value Ref Range   Lipase 50 11 - 59 U/L  Hepatic function panel  Result Value Ref Range   Total Protein 6.0 6.0 - 8.3 g/dL   Albumin 3.7 3.5 - 5.2 g/dL   AST 38 (H) 0 - 37 U/L   ALT 28 0 - 35 U/L   Alkaline Phosphatase 47 39 - 117 U/L   Total Bilirubin 0.7 0.3 - 1.2 mg/dL   Bilirubin, Direct 0.1 0.0 - 0.5 mg/dL   Indirect Bilirubin 0.6 0.3 - 0.9 mg/dL  I-stat troponin, ED (not at [facility])  Result Value Ref Range   Troponin i, poc 0.00 0.00 - 0.08 ng/mL   Comment 3             Imaging  Review US Abdomen Complete  11/29/2014   CLINICAL DATA:  Upper abdominal pain after exercise today.  EXAM: ULTRASOUND ABDOMEN COMPLETE  COMPARISON:  Abdominal ultrasound 05/18/2014.  FINDINGS: Gallbladder: No gallstones or wall thickening visualized. No sonographic Murphy sign noted.  Common bile duct: Diameter: 0.4 cm  Liver: No focal lesion identified. Within normal limits in parenchymal echogenicity.  IVC: No abnormality visualized.  Pancreas: Visualized portion unremarkable.  Spleen: Size and appearance within normal limits.  Right Kidney: Length: 9.9 cm. Echogenicity within normal limits. No mass or hydronephrosis visualized.  Left Kidney: Length: 10.7 cm. Echogenicity within normal limits. No mass or hydronephrosis visualized.  Abdominal aorta: No aneurysm visualized.  Other findings: None.  IMPRESSION: Negative for gallstones.  Negative exam.   Electronically Signed   By: Drusilla Kanner M.D.   On: 11/29/2014 14:23   Dg Chest Port 1 View  11/29/2014   CLINICAL DATA:  Chest pain, dizziness  EXAM: PORTABLE CHEST - 1 VIEW  COMPARISON:  12/02/2013  FINDINGS: Cardiomediastinal silhouette is unremarkable. No acute infiltrate or pleural effusion. No pulmonary edema. Metallic fixation rods thoracolumbar spine.  IMPRESSION: No active disease.   Electronically Signed   By: Natasha Mead M.D.   On: 11/29/2014 13:03     EKG Interpretation   Date/Time:  Wednesday November 29 2014 11:54:24 EST Ventricular Rate:  80 PR Interval:  154 QRS Duration: 86 QT Interval:  382 QTC Calculation: 441 R Axis:   86 Text Interpretation:  Sinus rhythm Probable left atrial enlargement  Borderline right axis deviation Confirmed by Anamaria Dusenbury  MD, Mavery Milling ([id number])  on 11/29/2014 12:05:43 PM      MDM   Final diagnoses:  Chest pain, unspecified chest pain type    Workup for the epigastric lower abdominal chest pain negative from an abdominal standpoint. Ultrasound showed no gallstones. Labs without any significant abnormalities  as  far as liver enzymes or lipase. Leaving patient with concerns for atypical chest pain presentation and needing admission for rule out. First troponin negative. Patient is now pain-free. Patient did have aspirin prior to arrival. EKG has no acute changes. Chest x-ray was negative. Chest x-ray negative for pneumonia pulmonary edema or pneumothorax.    Vanetta Mulders, MD 11/29/14 1531

## 2014-11-29 NOTE — ED Notes (Signed)
Patient does take estrogen supplement/medication

## 2014-11-29 NOTE — ED Notes (Signed)
Per EMS: was at gym working out doing routine normal, feeling flushed, lightheaded, nausea, and clammy.  10 minutes after stopping wortkout she started feeling constant epigastric pressure.  Since EMS transport the pain subsided and felt better after burping.  12 lead NSR unremarkable.  She took 325 ASA before ems arrived.  Orthostatics was negative.  Patient now feels better but just feels off. 20 in left ac

## 2014-11-29 NOTE — H&P (Signed)
[facility] History and Physical  Luciana Cammarata D'Arconte ZOX:[id number] DOB: Jul 04, 1941 DOA: 11/29/2014  Referring physician: ED physician.  PCP: Cala Bradford, MD   Chief Complaint: Epigastric, chest pain.   HPI: Lynn Kim is a 74 y.o. female with PMH significant for food allergy, indigestion, who presents today to the ED after an episode of epigastric pain,  Diaphoresis, dizziness that started after she finished her steps aerobic exercises. She relates that today during aerobic class, she didn't feel quite well. She finished few minutes early. She them develops this epigastric pain, tightness, diaphoresis and lightheadness. She felt she was going to pass out. She went home, took an aspirin, and them call 911. She has history of indigestions, and back pain but this pain was different.  She is chest pain free in the ED. She denies chest pain on exertion.  Evaluation in the ED; troponin negative, EKG sinus no ST changes, chest x ray negative. AB [place]: negative exam.  Review of Systems:  Negative, except as per HPI.   Past Medical History  Diagnosis Date  . Osteoporosis   . Vertigo   . Heart murmur     CHILDHOOD FUNCTIONAL HEART MURMUR  . Anxiety     PAST HISTORY  . Depression     genetic gene  . Arthritis     bilateral hands   Past Surgical History  Procedure Laterality Date  . Back surgery    . Tonsillectomy    . Appendectomy    . Bunionectomy    . Left breast cyst removal    . Hysteroscopy w/d&c N/A 06/15/2014    Procedure: DILATATION AND CURETTAGE /HYSTEROSCOPY;  Surgeon: Sharon Seller, DO;  Location: [facility];  Service: Gynecology;  Laterality: N/A;  w/Polypectomy   Social History:  reports that she has quit smoking. She quit smokeless tobacco use about 51 years ago. She reports that she drinks alcohol. She reports that she does not use illicit drugs.  Allergies  Allergen Reactions  . Eggs Or Egg-Derived Products Nausea And Vomiting  . Food     Almonds, cane  sugar, grains   . Erythromycin Ethylsuccinate Rash  . Lanolin Rash  . Penicillins Rash  . Petrolatum Rash  . Sertraline Hcl Anxiety  . Sulfa Antibiotics Rash    Family History; Mother with hear diseases, diagnosed at 68 year old.    Prior to Admission medications   Medication Sig Start Date End Date Taking? Authorizing Provider  Alpha Lipoic Acid 200 MG CAPS Take 1 capsule by mouth 3 (three) times daily.   Yes Historical Provider, MD  CALCIUM-MAGNESIUM-VITAMIN D PO Take 1 tablet by mouth 2 (two) times daily.   Yes Historical Provider, MD  Calcium-Vitamin D-Vitamin K (VIACTIV) 500-500-40 MG-UNT-MCG CHEW Chew 1 tablet by mouth daily with lunch.   Yes Historical Provider, MD  Cholecalciferol (VITAMIN D3) 2000 UNITS TABS Take 1 tablet by mouth 2 (two) times daily.   Yes Historical Provider, MD  Digestive Enzymes (DIGESTIVE ENZYME PO) Take 1 tablet by mouth 2 (two) times daily before lunch and supper.   Yes Historical Provider, MD  Glucosamine Sulfate 1000 MG TABS Take 1 tablet by mouth 2 (two) times daily.   Yes Historical Provider, MD  loratadine (CLARITIN) 10 MG tablet Take 10 mg by mouth daily.    Yes Historical Provider, MD  MAGNESIUM GLYCINATE PLUS PO Take 200 mg by mouth at bedtime.   Yes Historical Provider, MD  Multiple Vitamin (MULTIVITAMIN) tablet Take 1 tablet by mouth  daily.   Yes Historical Provider, MD  Nutritional Supplements (DHEA PO) Take 5 mg by mouth daily.   Yes Historical Provider, MD  Omega-3 Fatty Acids (FISH OIL) 500 MG CAPS Take 1 capsule by mouth 2 (two) times daily.   Yes Historical Provider, MD  PRESCRIPTION MEDICATION Apply 1 application topically daily. Bi-est cream - compounded at [facility]   Yes Historical Provider, MD  PRESCRIPTION MEDICATION Apply 1 application topically 3 (three) times a week. Estriol - Testosterone Cream - three times weekly (Sunday, Tuesday, Friday) Compounded at [facility]   Yes Historical Provider, MD    PRESCRIPTION MEDICATION Take 1 capsule by mouth daily. Progesterone SR 100 mg capsules - compounded at [facility]   Yes Historical Provider, MD  Probiotic Product (ALIGN PO) Take 1 capsule by mouth 1 day or 1 dose.   Yes Historical Provider, MD  pseudoephedrine (SUDAFED) 30 MG tablet Take 30 mg by mouth every 4 (four) hours as needed for congestion.    Yes Historical Provider, MD  Turmeric Curcumin 500 MG CAPS Take 1 capsule by mouth daily.   Yes Historical Provider, MD   Physical Exam: Filed Vitals:   11/29/14 1515 11/29/14 1530 11/29/14 1545 11/29/14 1654  BP: 121/54 134/58 107/85 125/57  Pulse: 77 92 85 88  Temp:    98.6 F (37 C)  TempSrc:    Oral  Resp: 19 21 19 20   Height:    5\' 3"  (1.6 m)  Weight:    55.792 kg (123 lb)  SpO2: 95% 98% 98% 98%    Wt Readings from Last 3 Encounters:  11/29/14 55.792 kg (123 lb)  06/14/14 56.87 kg (125 lb 6 oz)    General:  Appears calm and comfortable Eyes: PERRL, normal lids, irises & conjunctiva ENT: grossly normal hearing, lips & tongue Neck: no LAD, masses or thyromegaly Cardiovascular: RRR, no m/r/g. No LE edema. Telemetry: SR, no arrhythmias  Respiratory: CTA bilaterally, no w/r/r. Normal respiratory effort. Abdomen: soft, ntnd Skin: no rash or induration seen on limited exam Musculoskeletal: grossly normal tone BUE/BLE Psychiatric: grossly normal mood and affect, speech fluent and appropriate Neurologic: grossly non-focal.          Labs on Admission:  Basic Metabolic Panel:  Recent Labs Lab 11/29/14 1223  NA 140  K 3.8  CL 106  CO2 31  GLUCOSE 107*  BUN 15  CREATININE 0.86  CALCIUM 9.7   Liver Function Tests:  Recent Labs Lab 11/29/14 1223  AST 38*  ALT 28  ALKPHOS 47  BILITOT 0.7  PROT 6.0  ALBUMIN 3.7    Recent Labs Lab 11/29/14 1223  LIPASE 50   No results for input(s): AMMONIA in the last 168 hours. CBC:  Recent Labs Lab 11/29/14 1223  WBC 8.4  HGB 14.6  HCT 43.5  MCV 92.8   PLT 217   Cardiac Enzymes: No results for input(s): CKTOTAL, CKMB, CKMBINDEX, TROPONINI in the last 168 hours.  BNP (last 3 results) No results for input(s): BNP in the last 8760 hours.  ProBNP (last 3 results) No results for input(s): PROBNP in the last 8760 hours.  CBG: No results for input(s): GLUCAP in the last 168 hours.  Radiological Exams on Admission: Koreas Abdomen Complete  11/29/2014   CLINICAL DATA:  Upper abdominal pain after exercise today.  EXAM: ULTRASOUND ABDOMEN COMPLETE  COMPARISON:  Abdominal ultrasound 05/18/2014.  FINDINGS: Gallbladder: No gallstones or wall thickening visualized. No sonographic Murphy sign noted.  Common  bile duct: Diameter: 0.4 cm  Liver: No focal lesion identified. Within normal limits in parenchymal echogenicity.  IVC: No abnormality visualized.  Pancreas: Visualized portion unremarkable.  Spleen: Size and appearance within normal limits.  Right Kidney: Length: 9.9 cm. Echogenicity within normal limits. No mass or hydronephrosis visualized.  Left Kidney: Length: 10.7 cm. Echogenicity within normal limits. No mass or hydronephrosis visualized.  Abdominal aorta: No aneurysm visualized.  Other findings: None.  IMPRESSION: Negative for gallstones.  Negative exam.   Electronically Signed   By: Drusilla Kanner M.D.   On: 11/29/2014 14:23   Dg Chest Port 1 View  11/29/2014   CLINICAL DATA:  Chest pain, dizziness  EXAM: PORTABLE CHEST - 1 VIEW  COMPARISON:  12/02/2013  FINDINGS: Cardiomediastinal silhouette is unremarkable. No acute infiltrate or pleural effusion. No pulmonary edema. Metallic fixation rods thoracolumbar spine.  IMPRESSION: No active disease.   Electronically Signed   By: Natasha Mead M.D.   On: 11/29/2014 13:03    EKG: Independently reviewed. Sinus rhythm, no st changes.   Assessment/Plan Principal Problem:   Chest pain  1-Chest pain, Epigastric Pain:  Patient presents with atypical chest pain, but also she experience diaphoresis, dizziness.   Will admit to rule out ACS. Cycle cardiac enzymes. Check ECHO.  Aspirin. Patient does not want to take PPI. Nitroglycerin PRN for pain.  Further work up depending on test results.  NPO after midnight.  Abdominal US negative.    Code Status: Full Code.  DVT Prophylaxis:loveox.  Family Communication: Care discussed with patient.  Disposition Plan: Expect less than 2 days in hospital.   Time spent: 75 minutes.   Hartley Barefoot A [facility] Pager [phone]

## 2014-11-29 NOTE — ED Notes (Signed)
cbg with ems was 103

## 2014-11-30 ENCOUNTER — Other Ambulatory Visit: Payer: Self-pay

## 2014-11-30 DIAGNOSIS — R1013 Epigastric pain: Secondary | ICD-10-CM | POA: Diagnosis not present

## 2014-11-30 DIAGNOSIS — R079 Chest pain, unspecified: Secondary | ICD-10-CM

## 2014-11-30 DIAGNOSIS — R61 Generalized hyperhidrosis: Secondary | ICD-10-CM | POA: Diagnosis not present

## 2014-11-30 DIAGNOSIS — R110 Nausea: Secondary | ICD-10-CM | POA: Diagnosis not present

## 2014-11-30 LAB — LIPID PANEL
CHOL/HDL RATIO: 2.6 ratio
Cholesterol: 151 mg/dL (ref 0–200)
HDL: 57 mg/dL (ref >39)
LDL CALC: 82 mg/dL (ref 0–99)
Triglycerides: 61 mg/dL (ref <150)
VLDL: 12 mg/dL (ref 0–40)

## 2014-11-30 LAB — TROPONIN I

## 2014-11-30 NOTE — Procedures (Signed)
Pre-test: no chest pain or SOB.   With GXT: Pt had been NPO since midnight. HR reached target in stage 1, but pt did not have chest pain and SOB was mild.  Exercise continued till the end to stage 2. HR reached > 100 % of predicted maximum, based on age. She had some SOB, no chest pain, no epigastric discomfort, no presyncope.  Exercise limiting factor was MS pain, she has rods in her back. She does not tolerate walking on an incline or at a high rate of speed very well.  ECG: no clear ischemic changes, MD to review.  BP became elevated during the test, maximum 181/69.  Theodore Demarkhonda Barrett, PA-C 11/30/2014 3:12 PM Beeper [phone]  No evidence of stress induced ischemia. Normal stress test.  Thurmon FairMihai Germany Dodgen, MD, [facility] [phone] office [phone] pager

## 2014-11-30 NOTE — Progress Notes (Signed)
UR completed 

## 2014-11-30 NOTE — Progress Notes (Signed)
PROGRESS NOTE  Donivan ScullJoyce J D'Arconte ZOX:[id number] DOB: [birth year] DOA: 11/29/2014 PCP: Cala BradfordWHITE,CYNTHIA S, MD  HPI/Subjective: Lynn PolesJoyce D'Arconte is a 74 yo white female with a history of osteoporosis, indigestion, and back pain who presented to the ED with chest pain. Patient began feeling "bad" at the end of her step aerobics class and needed to leave early. While in the car, she began feeling clammy, nauseous, and having epigastric pain. She took an aspirin and called 911. Pain subsided while in the ambulance and was resolved shortly after being in the ED. She states she has never felt like this before and it doesn't feel like her normal indigestion or back pain.   Today she feels much better. She denies chest pain, abdominal pain, nausea, vomiting, diaphoresis.   Assessment/Plan:  Chest Pain  - Epigastric/chest pain began yesterday, has since resolved - NSR EKG, negative troponins - EKG 3/2: no active disease - Will obtain ECHO today; if normal, likely discharge today due to low risk - Continue ASA 325mg   Dyspepsia  - Intermittent dyspepsia - US 3/2: no gallstones, negative  - Continue 2 tablets of Tums       DVT Prophylaxis:  Lovenox SQ 40mg    Code Status: Full  Family Communication: Spoke with patient Disposition Plan: Likely discharge home today following normal ECHO results    Consultants:  None    Procedures:  None  Antibiotics:  None  Objective: Filed Vitals:   11/29/14 1654 11/29/14 2038 11/30/14 0000 11/30/14 0430  BP: 125/57 120/63 115/50 113/59  Pulse: 88 69 73 74  Temp: 98.6 F (37 C) 98.2 F (36.8 C) 97.9 F (36.6 C) 97.8 F (36.6 C)  TempSrc: Oral Oral Oral Oral  Resp: 20 18 18 14   Height: 5\' 3"  (1.6 m)     Weight: 55.792 kg (123 lb)   55.021 kg (121 lb 4.8 oz)  SpO2: 98% 98% 97% 95%    Intake/Output Summary (Last 24 hours) at 11/30/14 0936 Last data filed at 11/30/14 0900  Gross per 24 hour  Intake      0 ml  Output      0 ml  Net      0  ml   Filed Weights   11/29/14 1151 11/29/14 1654 11/30/14 0430  Weight: 57.153 kg (126 lb) 55.792 kg (123 lb) 55.021 kg (121 lb 4.8 oz)    Exam: General: Well developed, well nourished, NAD, appears stated age  HEENT:  Anicteic Sclera, MMM.   Neck: Supple, no JVD, no masses  Cardiovascular: RRR, S1 S2 auscultated, no rubs, murmurs or gallops.   Respiratory: Clear to auscultation bilaterally with equal chest rise  Abdomen: Soft, nontender, nondistended, + bowel sounds  Extremities: warm dry without cyanosis clubbing or edema.  Neuro: Cranial nerves grossly intact.  Skin: Without rashes exudates or nodules.   Psych: Normal affect and demeanor with intact judgement and insight   Data Reviewed: Basic Metabolic Panel:  Recent Labs Lab 11/29/14 1223  NA 140  K 3.8  CL 106  CO2 31  GLUCOSE 107*  BUN 15  CREATININE 0.86  CALCIUM 9.7   Liver Function Tests:  Recent Labs Lab 11/29/14 1223  AST 38*  ALT 28  ALKPHOS 47  BILITOT 0.7  PROT 6.0  ALBUMIN 3.7    Recent Labs Lab 11/29/14 1223  LIPASE 50   CBC:  Recent Labs Lab 11/29/14 1223  WBC 8.4  HGB 14.6  HCT 43.5  MCV 92.8  PLT 217  Cardiac Enzymes:  Recent Labs Lab 11/29/14 1915 11/30/14 0405  TROPONINI <0.03 <0.03   Studies: US Abdomen Complete  11/29/2014   CLINICAL DATA:  Upper abdominal pain after exercise today.  EXAM: ULTRASOUND ABDOMEN COMPLETE  COMPARISON:  Abdominal ultrasound 05/18/2014.  FINDINGS: Gallbladder: No gallstones or wall thickening visualized. No sonographic Murphy sign noted.  Common bile duct: Diameter: 0.4 cm  Liver: No focal lesion identified. Within normal limits in parenchymal echogenicity.  IVC: No abnormality visualized.  Pancreas: Visualized portion unremarkable.  Spleen: Size and appearance within normal limits.  Right Kidney: Length: 9.9 cm. Echogenicity within normal limits. No mass or hydronephrosis visualized.  Left Kidney: Length: 10.7 cm. Echogenicity within normal  limits. No mass or hydronephrosis visualized.  Abdominal aorta: No aneurysm visualized.  Other findings: None.  IMPRESSION: Negative for gallstones.  Negative exam.   Electronically Signed   By: Drusilla Kanner M.D.   On: 11/29/2014 14:23   Dg Chest Port 1 View  11/29/2014   CLINICAL DATA:  Chest pain, dizziness  EXAM: PORTABLE CHEST - 1 VIEW  COMPARISON:  12/02/2013  FINDINGS: Cardiomediastinal silhouette is unremarkable. No acute infiltrate or pleural effusion. No pulmonary edema. Metallic fixation rods thoracolumbar spine.  IMPRESSION: No active disease.   Electronically Signed   By: Natasha Mead M.D.   On: 11/29/2014 13:03    Scheduled Meds: . acidophilus  1 capsule Oral Daily  . aspirin EC  325 mg Oral Daily  . enoxaparin (LOVENOX) injection  40 mg Subcutaneous Q24H  . lipase/protease/amylase  12,000 Units Oral BID AC   Continuous Infusions:   Principal Problem:   Chest pain    Jeannetta Ellis, PA-S [facility] 11/30/2014, 9:36 AM    Addendum  Patient seen and examined, chart and data base reviewed.  I agree with the above assessment and plan.  For full details please see Mrs. Jeannetta Ellis, PA-S note.  I reviewed and amended the above note as appropriate.  This note is for educational purposes, for billing purposes please refer to my notes.   Clint Lipps, MD [facility] Pager: [phone] 11/30/2014, 3:35 PM

## 2014-11-30 NOTE — Discharge Summary (Signed)
Physician Discharge Summary  Lynn Kim UJW:[id number] DOB: 04-Aug-1941 DOA: 11/29/2014  PCP: Cala Bradford, MD  Admit date: 11/29/2014 Discharge date: 11/30/2014  Time spent: 40 minutes  Recommendations for Outpatient Follow-up:  1.  Follow-up with primary care physician in one week.  Discharge Diagnoses:  Principal Problem:   Chest pain   Discharge Condition: Stable  Diet recommendation: Healthy  Filed Weights   11/29/14 1151 11/29/14 1654 11/30/14 0430  Weight: 57.153 kg (126 lb) 55.792 kg (123 lb) 55.021 kg (121 lb 4.8 oz)    History of present illness:  Lynn Kim is a 74 y.o. female with PMH significant for food allergy, indigestion, who presents today to the ED after an episode of epigastric pain, Diaphoresis, dizziness that started after she finished her steps aerobic exercises. She relates that today during aerobic class, she didn't feel quite well. She finished few minutes early. She them develops this epigastric pain, tightness, diaphoresis and lightheadness. She felt she was going to pass out. She went home, took an aspirin, and them call 911. She has history of indigestions, and back pain but this pain was different.  She is chest pain free in the ED. She denies chest pain on exertion.  Evaluation in the ED; troponin negative, EKG sinus no ST changes, chest x ray negative. AB [place]: negative exam.   Hospital Course:   Chest pain Patient presented to the ED with episode of epigastric pain, diaphoresis and dizziness. Episode started after she finished her steps exercise class. Cardiology consulted. 3 sets of cardiac enzymes and 12-lead EKG were negative for acute ischemia. Exercise stress test was done earlier today and showed no evidence of acute ischemia. Patient discharged home to follow-up with primary care physician.  Osteoporosis Patient is very active, continue physical exercise. Continue vitamin D and calcium supplements.    Procedures:  Exercise tolerance test done on 11/30/2014 showed NO evidence of stress-induced ischemia , Normal stress test.  Consultations:  Cardiology  Discharge Exam: Filed Vitals:   11/30/14 0430  BP: 113/59  Pulse: 74  Temp: 97.8 F (36.6 C)  Resp: 14   General: Alert and awake, oriented x3, not in any acute distress. HEENT: anicteric sclera, pupils reactive to light and accommodation, EOMI CVS: S1-S2 clear, no murmur rubs or gallops Chest: clear to auscultation bilaterally, no wheezing, rales or rhonchi Abdomen: soft nontender, nondistended, normal bowel sounds, no organomegaly Extremities: no cyanosis, clubbing or edema noted bilaterally Neuro: Cranial nerves II-XII intact, no focal neurological deficits  Discharge Instructions   Discharge Instructions    Diet - low sodium heart healthy    Complete by:  As directed      Increase activity slowly    Complete by:  As directed           Current Discharge Medication List    CONTINUE these medications which have NOT CHANGED   Details  Alpha Lipoic Acid 200 MG CAPS Take 1 capsule by mouth 3 (three) times daily.    CALCIUM-MAGNESIUM-VITAMIN D PO Take 1 tablet by mouth 2 (two) times daily.    Calcium-Vitamin D-Vitamin K (VIACTIV) 500-500-40 MG-UNT-MCG CHEW Chew 1 tablet by mouth daily with lunch.    Cholecalciferol (VITAMIN D3) 2000 UNITS TABS Take 1 tablet by mouth 2 (two) times daily.    Digestive Enzymes (DIGESTIVE ENZYME PO) Take 1 tablet by mouth 2 (two) times daily before lunch and supper.    Glucosamine Sulfate 1000 MG TABS Take 1 tablet by mouth 2 (two) times  daily.    loratadine (CLARITIN) 10 MG tablet Take 10 mg by mouth daily.     MAGNESIUM GLYCINATE PLUS PO Take 200 mg by mouth at bedtime.    Multiple Vitamin (MULTIVITAMIN) tablet Take 1 tablet by mouth daily.    Nutritional Supplements (DHEA PO) Take 5 mg by mouth daily.    Omega-3 Fatty Acids (FISH OIL) 500 MG CAPS Take 1 capsule by mouth 2  (two) times daily.    !! PRESCRIPTION MEDICATION Apply 1 application topically daily. Bi-est cream - compounded at [facility]    !! PRESCRIPTION MEDICATION Apply 1 application topically 3 (three) times a week. Estriol - Testosterone Cream - three times weekly (Sunday, Tuesday, Friday) Compounded at [facility]    !! PRESCRIPTION MEDICATION Take 1 capsule by mouth daily. Progesterone SR 100 mg capsules - compounded at [facility]    Probiotic Product (ALIGN PO) Take 1 capsule by mouth 1 day or 1 dose.    Turmeric Curcumin 500 MG CAPS Take 1 capsule by mouth daily.     !! - Potential duplicate medications found. Please discuss with provider.    STOP taking these medications     pseudoephedrine (SUDAFED) 30 MG tablet        Allergies  Allergen Reactions  . Eggs Or Egg-Derived Products Nausea And Vomiting  . Food     Almonds, cane sugar, grains   . Erythromycin Ethylsuccinate Rash  . Lanolin Rash  . Penicillins Rash  . Petrolatum Rash  . Sertraline Hcl Anxiety  . Sulfa Antibiotics Rash      The results of significant diagnostics from this hospitalization (including imaging, microbiology, ancillary and laboratory) are listed below for reference.    Significant Diagnostic Studies: US Abdomen Complete  11/29/2014   CLINICAL DATA:  Upper abdominal pain after exercise today.  EXAM: ULTRASOUND ABDOMEN COMPLETE  COMPARISON:  Abdominal ultrasound 05/18/2014.  FINDINGS: Gallbladder: No gallstones or wall thickening visualized. No sonographic Murphy sign noted.  Common bile duct: Diameter: 0.4 cm  Liver: No focal lesion identified. Within normal limits in parenchymal echogenicity.  IVC: No abnormality visualized.  Pancreas: Visualized portion unremarkable.  Spleen: Size and appearance within normal limits.  Right Kidney: Length: 9.9 cm. Echogenicity within normal limits. No mass or hydronephrosis visualized.  Left Kidney: Length: 10.7 cm. Echogenicity  within normal limits. No mass or hydronephrosis visualized.  Abdominal aorta: No aneurysm visualized.  Other findings: None.  IMPRESSION: Negative for gallstones.  Negative exam.   Electronically Signed   By: Drusilla Kanner M.D.   On: 11/29/2014 14:23   Dg Chest Port 1 View  11/29/2014   CLINICAL DATA:  Chest pain, dizziness  EXAM: PORTABLE CHEST - 1 VIEW  COMPARISON:  12/02/2013  FINDINGS: Cardiomediastinal silhouette is unremarkable. No acute infiltrate or pleural effusion. No pulmonary edema. Metallic fixation rods thoracolumbar spine.  IMPRESSION: No active disease.   Electronically Signed   By: Natasha Mead M.D.   On: 11/29/2014 13:03   Mm Digital Screening Bilateral  11/21/2014   CLINICAL DATA:  Screening.  EXAM: DIGITAL SCREENING BILATERAL MAMMOGRAM WITH CAD  COMPARISON:  Previous exam(s).  ACR Breast Density Category d: The breast tissue is extremely dense, which lowers the sensitivity of mammography.  FINDINGS: There are no findings suspicious for malignancy. Images were processed with CAD.  IMPRESSION: No mammographic evidence of malignancy. A result letter of this screening mammogram will be mailed directly to the patient.  RECOMMENDATION: Screening mammogram in one year. (Code:SM-B-01Y)  BI-RADS CATEGORY  1: Negative.   Electronically Signed   By: Christiana PellantGretchen  Green M.D.   On: 11/21/2014 17:02    Microbiology: No results found for this or any previous visit (from the past 240 hour(s)).   Labs: Basic Metabolic Panel:  Recent Labs Lab 11/29/14 1223  NA 140  K 3.8  CL 106  CO2 31  GLUCOSE 107*  BUN 15  CREATININE 0.86  CALCIUM 9.7   Liver Function Tests:  Recent Labs Lab 11/29/14 1223  AST 38*  ALT 28  ALKPHOS 47  BILITOT 0.7  PROT 6.0  ALBUMIN 3.7    Recent Labs Lab 11/29/14 1223  LIPASE 50   No results for input(s): AMMONIA in the last 168 hours. CBC:  Recent Labs Lab 11/29/14 1223  WBC 8.4  HGB 14.6  HCT 43.5  MCV 92.8  PLT 217   Cardiac  Enzymes:  Recent Labs Lab 11/29/14 1915 11/30/14 0405  TROPONINI <0.03 <0.03   BNP: BNP (last 3 results) No results for input(s): BNP in the last 8760 hours.  ProBNP (last 3 results) No results for input(s): PROBNP in the last 8760 hours.  CBG: No results for input(s): GLUCAP in the last 168 hours.     Signed:  Merrit Waugh A  [facility] 11/30/2014, 3:24 PM

## 2014-11-30 NOTE — Progress Notes (Signed)
Exercise induced epigastric discomfort and near syncope in an active 74 year old whose only risk factor is family history of non-premature heart disease. Schedule for exercise stress test. Discussed with Dr. Arthor CaptainElmahi.

## 2014-11-30 NOTE — Progress Notes (Signed)
  Echocardiogram 2D Echocardiogram has been performed.  Lynn Kim 11/30/2014, 11:11 AM

## 2016-05-06 ENCOUNTER — Other Ambulatory Visit: Payer: Self-pay | Admitting: Family Medicine

## 2016-05-06 DIAGNOSIS — Z1231 Encounter for screening mammogram for malignant neoplasm of breast: Secondary | ICD-10-CM

## 2016-05-15 ENCOUNTER — Ambulatory Visit
Admission: RE | Admit: 2016-05-15 | Discharge: 2016-05-15 | Disposition: A | Discharge status: Home / Self Care | Payer: Medicare Other | Source: Ambulatory Visit | Attending: Family Medicine | Admitting: Family Medicine

## 2016-05-15 DIAGNOSIS — Z1231 Encounter for screening mammogram for malignant neoplasm of breast: Secondary | ICD-10-CM

## 2016-12-04 ENCOUNTER — Other Ambulatory Visit: Payer: Self-pay | Admitting: Family Medicine

## 2016-12-04 ENCOUNTER — Ambulatory Visit
Admission: RE | Admit: 2016-12-04 | Discharge: 2016-12-04 | Disposition: A | Discharge status: Home / Self Care | Payer: Medicare Other | Source: Ambulatory Visit | Attending: Family Medicine | Admitting: Family Medicine

## 2016-12-04 DIAGNOSIS — M79671 Pain in right foot: Secondary | ICD-10-CM

## 2017-01-14 ENCOUNTER — Ambulatory Visit: Payer: Medicare Other | Attending: Orthopedic Surgery | Admitting: Physical Therapy

## 2017-01-14 ENCOUNTER — Encounter: Payer: Self-pay | Admitting: Physical Therapy

## 2017-01-14 DIAGNOSIS — R262 Difficulty in walking, not elsewhere classified: Secondary | ICD-10-CM | POA: Diagnosis present

## 2017-01-14 DIAGNOSIS — M25671 Stiffness of right ankle, not elsewhere classified: Secondary | ICD-10-CM | POA: Insufficient documentation

## 2017-01-14 DIAGNOSIS — M25571 Pain in right ankle and joints of right foot: Secondary | ICD-10-CM | POA: Insufficient documentation

## 2017-01-14 NOTE — Therapy (Signed)
[facility] [address] [place], [place], [address] Phone: [phone]   Fax:  [phone]  Physical Therapy Evaluation  Patient Details  Name: Lynn Kim MRN: [id number] Date of Birth: 03/09/1941 Referring Provider: Antionette Poles  Encounter Date: 01/14/2017      PT End of Session - 01/14/17 1056    Visit Number 1   PT Start Time 1010   PT Stop Time 1058   PT Time Calculation (min) 48 min   Activity Tolerance Patient tolerated treatment well   Behavior During Therapy [facility] for tasks assessed/performed      Past Medical History:  Diagnosis Date  . Anxiety    PAST HISTORY  . Arthritis    bilateral hands  . Depression    genetic gene  . Heart murmur    CHILDHOOD FUNCTIONAL HEART MURMUR  . Osteoporosis   . Vertigo     Past Surgical History:  Procedure Laterality Date  . APPENDECTOMY    . BACK SURGERY    . BUNIONECTOMY    . HYSTEROSCOPY W/D&C N/A 06/15/2014   Procedure: DILATATION AND CURETTAGE /HYSTEROSCOPY;  Surgeon: Sharon Seller, DO;  Location: [facility];  Service: Gynecology;  Laterality: N/A;  w/Polypectomy  . left breast cyst removal    . TONSILLECTOMY      There were no vitals filed for this visit.       Subjective Assessment - 01/14/17 1016    Subjective Patient reports that she was at the gym about 6 weeks ago and a moving part on a gym machine hit her in the back of the ankle/foot.  She sustained a fracture of the right metatarsal and talus.  She was in the boot for 4 + weeks.  She is now out of the boot.  She reports some pain in the lateral right ankle and the achilles   Limitations Standing   Patient Stated Goals resume all activities without difficulty   Currently in Pain? Yes   Pain Score 1    Pain Location Ankle   Pain Orientation Right;Lateral   Pain Descriptors / Indicators Sore   Pain Type Acute pain   Pain Onset More than a month ago   Pain Frequency Constant   Aggravating Factors  after sitting pain a 3/10, pain going down the stairs   Pain Relieving Factors rest   Effect of Pain on Daily Activities just have not returned to all normal activities            [facility] PT Assessment - 01/14/17 0001      Assessment   Medical Diagnosis right metatarsal and talus avulsion fractures   Referring Provider Antionette Poles   Onset Date/Surgical Date 12/03/16   Prior Therapy none     Precautions   Precautions None     Balance Screen   Has the patient fallen in the past 6 months No   Has the patient had a decrease in activity level because of a fear of falling?  No   Is the patient reluctant to leave their home because of a fear of falling?  No     Home Environment   Additional Comments has stairs, does a lot of yardwork     Prior Function   Level of Independence Independent   Vocation Retired   Leisure at gym 5 days a week     ROM / Strength   AROM / PROM / Strength AROM;PROM;Strength  AROM   AROM Assessment Site Ankle   Right/Left Ankle Right   Right Ankle Dorsiflexion 10   Right Ankle Plantar Flexion 40   Right Ankle Inversion 20   Right Ankle Eversion 8     PROM   PROM Assessment Site Ankle   Right/Left Ankle Right   Right Ankle Dorsiflexion 14   Right Ankle Plantar Flexion 45   Right Ankle Inversion 25   Right Ankle Eversion 14     Strength   Strength Assessment Site Ankle   Right/Left Ankle Right   Right Ankle Dorsiflexion 4+/5   Right Ankle Plantar Flexion 4-/5   Right Ankle Inversion 4-/5   Right Ankle Eversion 4-/5     Flexibility   Soft Tissue Assessment /Muscle Length --  mild tightness in the calf                                PT Long Term Goals - Feb 02, 2017 1101      PT LONG TERM GOAL #1   Title independent with HEP   Time 1   Period Weeks   Status Achieved               Plan - 02-Feb-2017 1056    Clinical Impression Statement Patient with an avulsion fracture of the  right metatarsal and talus.  She was in a boot for 5 weeks.  She is doing very well today, only has a little tightness in the achilles and with inversion, minor difficulty with walking down the stairs   Rehab Potential Good   PT Frequency One time visit   PT Treatment/Interventions ADLs/Self Care Home Management;Therapeutic exercise;Patient/family education   PT Next Visit Plan Patient doing very well, feel that she will do well with HEP for strength and flexibility and I cautioned her to go slow with return to any aerobics and wait on lateral motions   Consulted and Agree with Plan of Care Patient      Patient will benefit from skilled therapeutic intervention in order to improve the following deficits and impairments:  Decreased balance, Decreased strength, Difficulty walking, Decreased range of motion  Visit Diagnosis: Pain in right ankle and joints of right foot - Plan: PT plan of care cert/re-cert  Stiffness of right ankle, not elsewhere classified - Plan: PT plan of care cert/re-cert  Difficulty in walking, not elsewhere classified - Plan: PT plan of care cert/re-cert      G-Codes - 02-02-2017 1102    Functional Assessment Tool Used (Outpatient Only) foto 15% limitation   Functional Limitation Mobility: Walking and moving around   Mobility: Walking and Moving Around Current Status [phone]) At least 1 percent but less than 20 percent impaired, limited or restricted   Mobility: Walking and Moving Around Goal Status [phone]) At least 1 percent but less than 20 percent impaired, limited or restricted   Mobility: Walking and Moving Around Discharge Status [phone]) At least 1 percent but less than 20 percent impaired, limited or restricted       Problem List Patient Active Problem List   Diagnosis Date Noted  . Chest pain 11/29/2014    Jearld Lesch., PT 02/02/2017, 11:04 AM  [facility] [address] [place],  [place], [address] Phone: [phone]   Fax:  [phone]  Name: Lynn Kim MRN: [id number] Date of Birth: 05-10-1941

## 2017-01-14 NOTE — Patient Instructions (Signed)
Ankle Dorsiflexion: Long-Sitting (Single Leg)   Sit facing anchor, tubing around forefoot, pull toes back toward head. Repeat 10__ times per set. Repeat with other leg. Do _2_ sets per session. Do _2_ sessions per day.  Plantarflexion (Eccentric), (Resistance Band)   Point foot down against resistance band. Slowly release for 3-5 seconds. 10___ reps per set, __2_ sets, _2__ times per day.  Ankle Eversion: Long-Sitting   Loop tubing around feet just below toes, legs separated as far as tolerated, toes pointed inward. Rotate ankles, pointing toes outward. Repeat 10__ times per set. Do _2_ sets per session. Do _2_ sessions per day.  Inversion (Eccentric), (Resistance Band)   Pull foot in against resistance band. Slowly release for 3-5 seconds. _10__ reps per set, __2_ sets, _2__ times per day.  

## 2017-05-12 ENCOUNTER — Encounter (HOSPITAL_BASED_OUTPATIENT_CLINIC_OR_DEPARTMENT_OTHER): Payer: Self-pay | Admitting: *Deleted

## 2017-05-12 ENCOUNTER — Emergency Department (HOSPITAL_BASED_OUTPATIENT_CLINIC_OR_DEPARTMENT_OTHER)
Admission: EM | Admit: 2017-05-12 | Discharge: 2017-05-12 | Disposition: A | Discharge status: Home / Self Care | Payer: Medicare Other | Attending: Emergency Medicine | Admitting: Emergency Medicine

## 2017-05-12 DIAGNOSIS — Y999 Unspecified external cause status: Secondary | ICD-10-CM | POA: Diagnosis not present

## 2017-05-12 DIAGNOSIS — X501XXA Overexertion from prolonged static or awkward postures, initial encounter: Secondary | ICD-10-CM | POA: Diagnosis not present

## 2017-05-12 DIAGNOSIS — S39001A Unspecified injury of muscle, fascia and tendon of abdomen, initial encounter: Secondary | ICD-10-CM | POA: Diagnosis present

## 2017-05-12 DIAGNOSIS — Y93E1 Activity, personal bathing and showering: Secondary | ICD-10-CM | POA: Insufficient documentation

## 2017-05-12 DIAGNOSIS — R109 Unspecified abdominal pain: Secondary | ICD-10-CM | POA: Diagnosis not present

## 2017-05-12 DIAGNOSIS — T148XXA Other injury of unspecified body region, initial encounter: Secondary | ICD-10-CM

## 2017-05-12 DIAGNOSIS — Y92012 Bathroom of single-family (private) house as the place of occurrence of the external cause: Secondary | ICD-10-CM | POA: Insufficient documentation

## 2017-05-12 DIAGNOSIS — S39011A Strain of muscle, fascia and tendon of abdomen, initial encounter: Secondary | ICD-10-CM | POA: Insufficient documentation

## 2017-05-12 LAB — CBC WITH DIFFERENTIAL/PLATELET
Basophils Absolute: 0 10*3/uL (ref 0.0–0.1)
Basophils Relative: 0 %
Eosinophils Absolute: 0 10*3/uL (ref 0.0–0.7)
Eosinophils Relative: 0 %
HCT: 43.5 % (ref 36.0–46.0)
HEMOGLOBIN: 14.6 g/dL (ref 12.0–15.0)
Lymphocytes Relative: 24 %
Lymphs Abs: 1.5 10*3/uL (ref 0.7–4.0)
MCH: 30.8 pg (ref 26.0–34.0)
MCHC: 33.6 g/dL (ref 30.0–36.0)
MCV: 91.8 fL (ref 78.0–100.0)
MONOS PCT: 7 %
Monocytes Absolute: 0.5 10*3/uL (ref 0.1–1.0)
Neutro Abs: 4.3 10*3/uL (ref 1.7–7.7)
Neutrophils Relative %: 69 %
PLATELETS: 242 10*3/uL (ref 150–400)
RBC: 4.74 MIL/uL (ref 3.87–5.11)
RDW: 13.6 % (ref 11.5–15.5)
WBC: 6.3 10*3/uL (ref 4.0–10.5)

## 2017-05-12 LAB — COMPREHENSIVE METABOLIC PANEL
ALK PHOS: 53 U/L (ref 38–126)
ALT: 31 U/L (ref 14–54)
AST: 38 U/L (ref 15–41)
Albumin: 4 g/dL (ref 3.5–5.0)
Anion gap: 10 (ref 5–15)
BUN: 15 mg/dL (ref 6–20)
CALCIUM: 9.2 mg/dL (ref 8.9–10.3)
CO2: 26 mmol/L (ref 22–32)
CREATININE: 0.78 mg/dL (ref 0.44–1.00)
Chloride: 103 mmol/L (ref 101–111)
GFR calc Af Amer: >60 mL/min (ref >60)
Glucose, Bld: 121 mg/dL — ABNORMAL HIGH (ref 65–99)
Potassium: 3.7 mmol/L (ref 3.5–5.1)
Sodium: 139 mmol/L (ref 135–145)
Total Bilirubin: 0.6 mg/dL (ref 0.3–1.2)
Total Protein: 6.1 g/dL — ABNORMAL LOW (ref 6.5–8.1)

## 2017-05-12 NOTE — ED Provider Notes (Signed)
[facility] Note   CSN: [id number] Arrival date & time: 05/12/17  0735     History   Chief Complaint Chief Complaint  Patient presents with  . Abdominal Pain    HPI Lynn Kim is a 76 y.o. female.Patient reports that 4 nights ago she twisted while getting out of the bathtub and had pain at left and right lateral ribs. Pain hs since gone on and presently complains of pain at epigastrium. Pain is worse with sitting and improved with lying supine nonexertional. No shortness of breath no fever no nausea or vomiting. Pain is not affected by eating or walking. She treated herself with an aspirin tablet with partial relief. No other associated symptoms she is concerned that she might have either "a hernia or a muscle pull."  HPI  Past Medical History:  Diagnosis Date  . Anxiety    PAST HISTORY  . Arthritis    bilateral hands  . Depression    genetic gene  . Heart murmur    CHILDHOOD FUNCTIONAL HEART MURMUR  . Osteoporosis   . Vertigo     Patient Active Problem List   Diagnosis Date Noted  . Chest pain 11/29/2014    Past Surgical History:  Procedure Laterality Date  . APPENDECTOMY    . BACK SURGERY    . BUNIONECTOMY    . HYSTEROSCOPY W/D&C N/A 06/15/2014   Procedure: DILATATION AND CURETTAGE /HYSTEROSCOPY;  Surgeon: Sharon Seller, DO;  Location: [facility];  Service: Gynecology;  Laterality: N/A;  w/Polypectomy  . left breast cyst removal    . TONSILLECTOMY      OB History    No data available       Home Medications    Prior to Admission medications   Medication Sig Start Date End Date Taking? Authorizing Provider  Alpha Lipoic Acid 200 MG CAPS Take 1 capsule by mouth 3 (three) times daily.    [provider]  CALCIUM-MAGNESIUM-VITAMIN D PO Take 1 tablet by mouth 2 (two) times daily.    [provider]  Calcium-Vitamin D-Vitamin K (VIACTIV) 500-500-40 MG-UNT-MCG CHEW Chew 1 tablet by mouth daily with lunch.    [provider]  Cholecalciferol (VITAMIN D3) 2000 UNITS TABS Take 1 tablet by mouth 2 (two) times daily.    [provider]  Digestive Enzymes (DIGESTIVE ENZYME PO) Take 1 tablet by mouth 2 (two) times daily before lunch and supper.    [provider]  Glucosamine Sulfate 1000 MG TABS Take 1 tablet by mouth 2 (two) times daily.    [provider]  loratadine (CLARITIN) 10 MG tablet Take 10 mg by mouth daily.     [provider]  MAGNESIUM GLYCINATE PLUS PO Take 200 mg by mouth at bedtime.    [provider]  Multiple Vitamin (MULTIVITAMIN) tablet Take 1 tablet by mouth daily.    [provider]  Nutritional Supplements (DHEA PO) Take 5 mg by mouth daily.    [provider]  Omega-3 Fatty Acids (FISH OIL) 500 MG CAPS Take 1 capsule by mouth 2 (two) times daily.    [provider]  PRESCRIPTION MEDICATION Apply 1 application topically daily. Bi-est cream - compounded at [facility]    [provider]  PRESCRIPTION MEDICATION Apply 1 application topically 3 (three) times a week. Estriol - Testosterone Cream - three times weekly (Sunday, Tuesday, Friday) Compounded at [facility]    [provider]  PRESCRIPTION MEDICATION  Take 1 capsule by mouth daily. Progesterone SR 100 mg capsules - compounded at [facility]    [facility], Historical, MD  Probiotic Product (ALIGN PO) Take 1 capsule by mouth 1 day or 1 dose.    [provider]  Turmeric Curcumin 500 MG CAPS Take 1 capsule by mouth daily.    [provider]    Family History No family history on file.  Social History Social History  Substance Use Topics  . Smoking status: Former Games developermoker  . Smokeless tobacco: Former NeurosurgeonUser    Quit date: 04/06/1963  . Alcohol use Yes     Comment: occasionally     Allergies   Eggs or egg-derived products; Food; Erythromycin ethylsuccinate; Lanolin; Penicillins;  Petrolatum; Sertraline hcl; and Sulfa antibiotics   Review of Systems Review of Systems  Constitutional: Negative.   HENT: Negative.   Respiratory: Negative.   Cardiovascular: Positive for chest pain.       Bilateral rib pain  Gastrointestinal: Positive for abdominal pain.       Epigastric pain  Musculoskeletal: Negative.   Skin: Negative.   Neurological: Negative.   Psychiatric/Behavioral: Negative.   All other systems reviewed and are negative.    Physical Exam Updated Vital Signs BP (!) 152/83 (BP Location: Right Arm)   Pulse 89   Temp 98.1 F (36.7 C) (Oral)   Resp 16   SpO2 99%   Physical Exam  Constitutional: She appears well-developed and well-nourished.  HENT:  Head: Normocephalic and atraumatic.  Eyes: Pupils are equal, round, and reactive to light. Conjunctivae are normal.  Neck: Neck supple. No tracheal deviation present. No thyromegaly present.  Cardiovascular: Normal rate and regular rhythm.   No murmur heard. Pulmonary/Chest: Effort normal and breath sounds normal.  Abdominal: Soft. Bowel sounds are normal. She exhibits no distension and no mass. There is tenderness. There is no rebound and no guarding. No hernia.  Mild tenderness at epigastrium  Musculoskeletal: Normal range of motion. She exhibits no edema or tenderness.  Neurological: She is alert. Coordination normal.  Skin: Skin is warm and dry. No rash noted.  Psychiatric: She has a normal mood and affect.  Nursing note and vitals reviewed.    ED Treatments / Results  Labs (all labs ordered are listed, but only abnormal results are displayed) Labs Reviewed  COMPREHENSIVE METABOLIC PANEL  CBC WITH DIFFERENTIAL/PLATELET    EKG  EKG Interpretation None       Radiology No results found.  Procedures Procedures (including critical care time)  Medications Ordered in ED Medications - No data to display  Results for orders placed or performed during the hospital encounter of 05/12/17    Comprehensive metabolic panel  Result Value Ref Range   Sodium 139 135 - 145 mmol/L   Potassium 3.7 3.5 - 5.1 mmol/L   Chloride 103 101 - 111 mmol/L   CO2 26 22 - 32 mmol/L   Glucose, Bld 121 (H) 65 - 99 mg/dL   BUN 15 6 - 20 mg/dL   Creatinine, Ser 9.520.78 0.44 - 1.00 mg/dL   Calcium 9.2 8.9 - 84.110.3 mg/dL   Total Protein 6.1 (L) 6.5 - 8.1 g/dL   Albumin 4.0 3.5 - 5.0 g/dL   AST 38 15 - 41 U/L   ALT 31 14 - 54 U/L   Alkaline Phosphatase 53 38 - 126 U/L   Total Bilirubin 0.6 0.3 - 1.2 mg/dL   GFR calc non Af Amer >60 >60 mL/min   GFR calc  Af Amer >60 >60 mL/min   Anion gap 10 5 - 15  CBC with Differential/Platelet  Result Value Ref Range   WBC 6.3 4.0 - 10.5 K/uL   RBC 4.74 3.87 - 5.11 MIL/uL   Hemoglobin 14.6 12.0 - 15.0 g/dL   HCT 40.9 81.1 - 91.4 %   MCV 91.8 78.0 - 100.0 fL   MCH 30.8 26.0 - 34.0 pg   MCHC 33.6 30.0 - 36.0 g/dL   RDW 78.2 95.6 - 21.3 %   Platelets 242 150 - 400 K/uL   Neutrophils Relative % 69 %   Neutro Abs 4.3 1.7 - 7.7 K/uL   Lymphocytes Relative 24 %   Lymphs Abs 1.5 0.7 - 4.0 K/uL   Monocytes Relative 7 %   Monocytes Absolute 0.5 0.1 - 1.0 K/uL   Eosinophils Relative 0 %   Eosinophils Absolute 0.0 0.0 - 0.7 K/uL   Basophils Relative 0 %   Basophils Absolute 0.0 0.0 - 0.1 K/uL   No results found. Initial Impression / Assessment and Plan / ED Course  I have reviewed the triage vital signs and the nursing notes.  Pertinent labs & imaging results that were available during my care of the patient were reviewed by me and considered in my medical decision making (see chart for details).   declines pain  8:40 AM patient is resting comfortably. Symptoms consistent with muscle strain. Plan Tylenol as needed for pain ice or heat. See Dr. Cliffton Asters if not improving in a few days. Return as needed Final Clinical Impressions(s) / ED Diagnoses  Diagnosis muscle strain Final diagnoses:  None    New Prescriptions New Prescriptions   No medications on file      Doug Sou, MD 05/12/17 [phone]

## 2017-05-12 NOTE — Discharge Instructions (Signed)
Hold an ice pack or a heat pack on the uncomfortable area  4 times daily 30 minutes at a time for comfort. It is safe to take Tylenol for pain. Aspirin can cause upset stomach. See Dr. Cliffton AstersWhite in the office if not feeling better in 2 or 3 days. Return if concern for any reason

## 2017-05-12 NOTE — ED Triage Notes (Signed)
MD at bedside. Pt reports intermittent upper abd pain associated with movement for approx 1 mo. Denies fever, n/v/d, sob.

## 2017-05-27 ENCOUNTER — Encounter: Payer: Self-pay | Admitting: Physical Therapy

## 2017-05-27 ENCOUNTER — Ambulatory Visit: Payer: Medicare Other | Attending: Family Medicine | Admitting: Physical Therapy

## 2017-05-27 DIAGNOSIS — M25652 Stiffness of left hip, not elsewhere classified: Secondary | ICD-10-CM | POA: Diagnosis present

## 2017-05-27 DIAGNOSIS — M545 Low back pain, unspecified: Secondary | ICD-10-CM

## 2017-05-27 NOTE — Therapy (Signed)
[facility] [address] [place], [place], [address] Phone: [phone]   Fax:  [phone]  Physical Therapy Evaluation  Patient Details  Name: Lynn Kim MRN: [id number] Date of Birth: 1941-09-10 Referring Provider: Laurann Montana  Encounter Date: 05/27/2017      PT End of Session - 05/27/17 1516    Visit Number 1   Date for PT Re-Evaluation 07/27/17   PT Start Time 1433   PT Stop Time 1522   PT Time Calculation (min) 49 min   Activity Tolerance Patient tolerated treatment well   Behavior During Therapy [facility] for tasks assessed/performed      Past Medical History:  Diagnosis Date  . Anxiety    PAST HISTORY  . Arthritis    bilateral hands  . Depression    genetic gene  . Heart murmur    CHILDHOOD FUNCTIONAL HEART MURMUR  . Osteoporosis   . Vertigo     Past Surgical History:  Procedure Laterality Date  . APPENDECTOMY    . BACK SURGERY    . BUNIONECTOMY    . HYSTEROSCOPY W/D&C N/A 06/15/2014   Procedure: DILATATION AND CURETTAGE /HYSTEROSCOPY;  Surgeon: Sharon Seller, DO;  Location: [facility];  Service: Gynecology;  Laterality: N/A;  w/Polypectomy  . left breast cyst removal    . TONSILLECTOMY      There were no vitals filed for this visit.       Subjective Assessment - 05/27/17 1430    Subjective Patient reports a few months ago she was using a pick axe and started having left side back pain and left leg pain.  No x-rays have been done.  C/O anterior hip tightness.  Reports to sit she has to sit on a hard surface, if not she has increased back pain.   Limitations Sitting;House hold activities   Patient Stated Goals have less pain   Currently in Pain? Yes   Pain Score 2    Pain Location Back   Pain Orientation Left   Pain Descriptors / Indicators Aching;Sore   Pain Type Acute pain   Pain Radiating Towards pain in the left rib area and in the buttock and left thigh area   Pain Onset More  than a month ago   Pain Frequency Constant   Aggravating Factors  sneezing, bending, sitting pain up to 8-9/10   Pain Relieving Factors rest, sitting on a hard surface, bending and going down the stairs    Effect of Pain on Daily Activities difficulty sitting, walking, going down the stairs            [facility] PT Assessment - 05/27/17 0001      Assessment   Medical Diagnosis left side and leg pain   Referring Provider Laurann Montana   Onset Date/Surgical Date 04/26/17   Prior Therapy no     Precautions   Precautions None     Balance Screen   Has the patient fallen in the past 6 months No   Has the patient had a decrease in activity level because of a fear of falling?  No   Is the patient reluctant to leave their home because of a fear of falling?  No     Home Environment   Additional Comments has stairs, does a lot of yardwork     Prior Function   Level of Independence Independent   Vocation Retired   Leisure goes to the [employer] everyday, walks, stretches and  does weights     ROM / Strength   AROM / PROM / Strength AROM;Strength     AROM   Overall AROM Comments Lumbar ROM was decreased greatly but this is expected with the Harrington rods     Strength   Overall Strength Comments Hips 4-/5 with some pain in the left low back      Flexibility   Soft Tissue Assessment /Muscle Length --  very tight and sore in the hip flexor, quad and adductors     Palpation   Palpation comment tender in the left SI, the left adductor, the left hip flexor and quad, ribs had good mobility without any pain            Objective measurements completed on examination: See above findings.          [facility] Adult PT Treatment/Exercise - 06/15/2017 0001      Modalities   Modalities Electrical Stimulation;Moist Heat     Moist Heat Therapy   Number Minutes Moist Heat 15 Minutes   Moist Heat Location Hip     Electrical Stimulation   Electrical Stimulation Location left SI and anterior  hip   Electrical Stimulation Action IFC   Electrical Stimulation Parameters supine   Electrical Stimulation Goals Pain                  PT Short Term Goals - 06-15-17 1524      PT SHORT TERM GOAL #1   Title independent with initial HEP   Time 2   Period Weeks   Status New           PT Long Term Goals - 06-15-17 1525      PT LONG TERM GOAL #1   Title decrease pain 50%   Time 8   Period Weeks   Status New     PT LONG TERM GOAL #2   Title Increase strength of the hips to 4/5   Time 8   Period Weeks   Status New                Plan - 06/15/17 1517    Clinical Impression Statement Patient reports a few months ago she was using a pick axe and started having pain in the left low back and in the left hip flexor and in the abdomen.  She is very tight in the left hip area.  Some tenderness in the left SI area.   History and Personal Factors relevant to plan of care: Harrington rods   Clinical Presentation Stable   Clinical Decision Making Low   Rehab Potential Good   PT Frequency 1x / week   PT Treatment/Interventions ADLs/Self Care Home Management;Electrical Stimulation;Iontophoresis 4mg /ml Dexamethasone;Moist Heat;Ultrasound;Therapeutic activities;Therapeutic exercise;Patient/family education;Manual techniques   PT Next Visit Plan see if treatment helps   Consulted and Agree with Plan of Care Patient      Patient will benefit from skilled therapeutic intervention in order to improve the following deficits and impairments:  Decreased strength, Impaired flexibility, Improper body mechanics, Pain, Increased muscle spasms, Decreased range of motion, Increased fascial restricitons  Visit Diagnosis: Acute left-sided low back pain without sciatica - Plan: PT plan of care cert/re-cert  Stiffness of left hip, not elsewhere classified - Plan: PT plan of care cert/re-cert      G-Codes - June 15, 2017 1529    Functional Assessment Tool Used (Outpatient Only) PT  decision decreased ROM, difficulty with going down the stairs   Functional Limitation Mobility:  Walking and moving around   Mobility: Walking and Moving Around Current Status [phone]) At least 40 percent but less than 60 percent impaired, limited or restricted   Mobility: Walking and Moving Around Goal Status [phone]) At least 20 percent but less than 40 percent impaired, limited or restricted       Problem List Patient Active Problem List   Diagnosis Date Noted  . Chest pain 11/29/2014    Jearld Lesch., PT 05/27/2017, 3:56 PM  [facility] [address] [place], [place], [address] Phone: [phone]   Fax:  [phone]  Name: Lynn Kim MRN: [id number] Date of Birth: 12-21-1940

## 2017-06-11 ENCOUNTER — Encounter: Payer: Self-pay | Admitting: Podiatry

## 2017-06-11 ENCOUNTER — Ambulatory Visit: Payer: Medicare Other | Attending: Family Medicine | Admitting: Physical Therapy

## 2017-06-11 ENCOUNTER — Ambulatory Visit (INDEPENDENT_AMBULATORY_CARE_PROVIDER_SITE_OTHER): Payer: Medicare Other | Admitting: Podiatry

## 2017-06-11 ENCOUNTER — Ambulatory Visit: Payer: Medicare Other | Admitting: Physical Therapy

## 2017-06-11 ENCOUNTER — Encounter: Payer: Self-pay | Admitting: Physical Therapy

## 2017-06-11 VITALS — BP 123/65 | HR 65

## 2017-06-11 DIAGNOSIS — M25652 Stiffness of left hip, not elsewhere classified: Secondary | ICD-10-CM | POA: Diagnosis present

## 2017-06-11 DIAGNOSIS — M545 Low back pain, unspecified: Secondary | ICD-10-CM

## 2017-06-11 DIAGNOSIS — B351 Tinea unguium: Secondary | ICD-10-CM

## 2017-06-11 DIAGNOSIS — M205X9 Other deformities of toe(s) (acquired), unspecified foot: Secondary | ICD-10-CM | POA: Diagnosis not present

## 2017-06-11 NOTE — Progress Notes (Signed)
Subjective:    Patient ID: Lynn Kim, female   DOB: 76 y.o.   MRN: [id number]   HPI patient presents with 2 separate problems with one being discoloration of the right pinky nail and #2 being reduced motion with moderate discomfort of the first MPJ left over right after having had surgery and number of years ago    Review of Systems  All other systems reviewed and are negative.       Objective:  Physical Exam  Constitutional: She appears well-developed and well-nourished.  Cardiovascular: Intact distal pulses.   Pulmonary/Chest: Effort normal.  Musculoskeletal: Normal range of motion.  Neurological: She is alert.  Skin: Skin is warm.  Nursing note and vitals reviewed.  neurovascular status found to be intact muscle strength is adequate range of motion was within normal limits with patient found to have reduced range of motion first MPJ left over right with mild crepitus around the joint surface and pain. 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

## 2017-06-11 NOTE — Progress Notes (Signed)
   Subjective:    Patient ID: Lynn Kim, female    DOB: 1941/07/29, 76 y.o.   MRN: [id number]  HPI  Chief Complaint  Patient presents with  . Nail Problem       Review of Systems  HENT: Positive for hearing loss and tinnitus.   Musculoskeletal: Positive for back pain and myalgias.  Allergic/Immunologic: Positive for food allergies.  All other systems reviewed and are negative.      Objective:   Physical Exam        Assessment & Plan:

## 2017-06-11 NOTE — Therapy (Signed)
[facility] [address] [place], [place], [address] Phone: [phone]   Fax:  [phone]  Physical Therapy Treatment  Patient Details  Name: Lynn Kim MRN: [id number] Date of Birth: 06/16/1941 Referring Provider: Laurann [place]  Encounter Date: 06/11/2017      PT End of Session - 06/11/17 0917    Visit Number 2   Date for PT Re-Evaluation 07/27/17   PT Start Time 0844   PT Stop Time 0940   PT Time Calculation (min) 56 min   Activity Tolerance Patient tolerated treatment well   Behavior During Therapy [facility] for tasks assessed/performed      Past Medical History:  Diagnosis Date  . Anxiety    PAST HISTORY  . Arthritis    bilateral hands  . Depression    genetic gene  . Heart murmur    CHILDHOOD FUNCTIONAL HEART MURMUR  . Osteoporosis   . Vertigo     Past Surgical History:  Procedure Laterality Date  . APPENDECTOMY    . BACK SURGERY    . BUNIONECTOMY    . HYSTEROSCOPY W/D&C N/A 06/15/2014   Procedure: DILATATION AND CURETTAGE /HYSTEROSCOPY;  Surgeon: Sharon Seller, DO;  Location: [facility];  Service: Gynecology;  Laterality: N/A;  w/Polypectomy  . left breast cyst removal    . TONSILLECTOMY      There were no vitals filed for this visit.      Subjective Assessment - 06/11/17 0835    Subjective I am getting better but it is slow, less rib pain.  Reports tenderness in the left GT area.  She is unable to lie on the left side   Currently in Pain? Yes   Pain Score 2    Pain Location Back  and hip area   Pain Orientation Left   Pain Descriptors / Indicators Aching   Aggravating Factors  lying on the left side                         [facility] Adult PT Treatment/Exercise - 06/11/17 0001      Exercises   Exercises Knee/Hip     Knee/Hip Exercises: Stretches   Passive Hamstring Stretch 4 reps;20 seconds   Quad Stretch 4 reps;20 seconds   Hip Flexor Stretch 4 reps;20 seconds   ITB Stretch 4 reps;20 seconds   Piriformis Stretch 4 reps;20 seconds     Knee/Hip Exercises: Aerobic   Elliptical R=5 I=10 x 5 minutes     Knee/Hip Exercises: Machines for Strengthening   Hip Cybex 5# hip abduction x 10, extension 2x10     Modalities   Modalities Iontophoresis     Moist Heat Therapy   Number Minutes Moist Heat 15 Minutes   Moist Heat Location Hip     Electrical Stimulation   Electrical Stimulation Location left SI and anterior hip   Electrical Stimulation Action IFC   Electrical Stimulation Parameters supine   Electrical Stimulation Goals Pain     Iontophoresis   Type of Iontophoresis Dexamethasone   Location left GT area   Dose 80mA dose   Time 4 hour patch                  PT Short Term Goals - 06/11/17 8413      PT SHORT TERM GOAL #1   Title independent with initial HEP   Status Achieved  PT Long Term Goals - 05/27/17 1525      PT LONG TERM GOAL #1   Title decrease pain 50%   Time 8   Period Weeks   Status New     PT LONG TERM GOAL #2   Title Increase strength of the hips to 4/5   Time 8   Period Weeks   Status New               Plan - 06/11/17 [id number]    Clinical Impression Statement Reports some increased tenderness in the left GT area.  Doing well overall, a lot of mm tightness in the hips   PT Next Visit Plan added ionto, see if this helps   Consulted and Agree with Plan of Care Patient      Patient will benefit from skilled therapeutic intervention in order to improve the following deficits and impairments:  Decreased strength, Impaired flexibility, Improper body mechanics, Pain, Increased muscle spasms, Decreased range of motion, Increased fascial restricitons  Visit Diagnosis: Acute left-sided low back pain without sciatica  Stiffness of left hip, not elsewhere classified     Problem List Patient Active Problem List   Diagnosis Date Noted  . Chest pain 11/29/2014    Jearld LeschALBRIGHT,MICHAEL W.,  PT 06/11/2017, 9:20 AM  [facility] [address] [place], [place], [address] Phone: [phone]   Fax:  [phone]  Name: Lynn Kim MRN: [id number] Date of Birth: 12/08/1940

## 2017-06-17 ENCOUNTER — Encounter: Payer: Self-pay | Admitting: Physical Therapy

## 2017-06-17 ENCOUNTER — Ambulatory Visit: Payer: Medicare Other | Admitting: Physical Therapy

## 2017-06-17 DIAGNOSIS — M545 Low back pain, unspecified: Secondary | ICD-10-CM

## 2017-06-17 DIAGNOSIS — M25652 Stiffness of left hip, not elsewhere classified: Secondary | ICD-10-CM

## 2017-06-17 NOTE — Therapy (Signed)
[facility] [address] [place], [place], [address] Phone: [phone]   Fax:  [phone]  Physical Therapy Treatment  Patient Details  Name: Lynn Kim MRN: [id number] Date of Birth: 06/08/1941 Referring Provider: Cynthia White  Encounter Date: 06/17/2017      PT End of Session - 06/17/17 1508    Visit Number 3   Date for PT Re-Evaluation 07/27/17   PT Start Time 1350   PT Stop Time 1455   PT Time Calculation (min) 65 min   Activity Tolerance Patient tolerated treatment well   Behavior During Therapy WFL for tasks assessed/performed      Past Medical History:  Diagnosis Date  . Anxiety    PAST HISTORY  . Arthritis    bilateral hands  . Depression    genetic gene  . Heart murmur    CHILDHOOD FUNCTIONAL HEART MURMUR  . Osteoporosis   . Vertigo     Past Surgical History:  Procedure Laterality Date  . APPENDECTOMY    . BACK SURGERY    . BUNIONECTOMY    . HYSTEROSCOPY W/D&C N/A 06/15/2014   Procedure: DILATATION AND CURETTAGE /HYSTEROSCOPY;  Surgeon: Jennifer M Ozan, DO;  Location: [facility];  Service: Gynecology;  Laterality: N/A;  w/Polypectomy  . left breast cyst removal    . TONSILLECTOMY      There were no vitals filed for this visit.      Subjective Assessment - 06/17/17 1359    Subjective Still                          [facility] Adult PT Treatment/Exercise - 06/17/17 0001      High Level Balance   High Level Balance Comments airex balance activities with changing BOS, head turns and with eyes closed     Knee/Hip Exercises: Stretches   Passive Hamstring Stretch 4 reps;20 seconds   Quad Stretch 4 reps;20 seconds   Hip Flexor Stretch 4 reps;20 seconds   ITB Stretch 4 reps;20 seconds   Piriformis Stretch 4 reps;20 seconds     Knee/Hip Exercises: Machines for Strengthening   Cybex Leg Press 20# 2x7 reps single legs only   Other Machine seated isometric abdominals with  ball     Moist Heat Therapy   Number Minutes Moist Heat 15 Minutes   Moist Heat Location Hip     Electrical Stimulation   Electrical Stimulation Location left SI and anterior hip   Electrical Stimulation Action IFC   Electrical Stimulation Parameters supine   Electrical Stimulation Goals Pain     Iontophoresis   Type of Iontophoresis Dexamethasone   Location left GT area   Dose 80mA dose   Time 4 hour patch #2                PT Education - 06/17/17 1508    Education provided Yes   Education Details Went over stretches, safe leg press use, balance exercises and isometric abdominals   Person(s) Educated Patient   Methods Explanation;Demonstration   Comprehension Verbalized understanding          PT Short Term Goals - 06/11/17 0918      PT SHORT TERM GOAL #1   Title independent with initial HEP   Status Achieved           PT Long Term Goals - 06/17/17 1511      PT LONG TERM GOAL #1     Title decrease pain 50%   Status Partially Met     PT LONG TERM GOAL #2   Title Increase strength of the hips to 4/5   Status Partially Met               Plan - 06/17/17 1509    Clinical Impression Statement Patient again doing well, trying different things at the gym without much increase of pain.  Needed some guidance on how to do some things to help alleviate stress on the back and the hip.  The quad and hip flexors are tight.   PT Next Visit Plan she is to try some things we wnet over today and may return in 2 weeks   Consulted and Agree with Plan of Care Patient      Patient will benefit from skilled therapeutic intervention in order to improve the following deficits and impairments:  Decreased strength, Impaired flexibility, Improper body mechanics, Pain, Increased muscle spasms, Decreased range of motion, Increased fascial restricitons  Visit Diagnosis: Acute left-sided low back pain without sciatica  Stiffness of left hip, not elsewhere  classified     Problem List Patient Active Problem List   Diagnosis Date Noted  . Chest pain 11/29/2014    ALBRIGHT,MICHAEL W., PT 06/17/2017, 3:11 PM  [facility] [address] [place], [place], [address] Phone: [phone]   Fax:  [phone]  Name: Lynn Kim MRN: [id number] Date of Birth: 04/09/1941   

## 2017-07-01 ENCOUNTER — Encounter: Payer: Self-pay | Admitting: Physical Therapy

## 2017-07-01 ENCOUNTER — Ambulatory Visit: Payer: Medicare Other | Attending: Family Medicine | Admitting: Physical Therapy

## 2017-07-01 DIAGNOSIS — M25652 Stiffness of left hip, not elsewhere classified: Secondary | ICD-10-CM

## 2017-07-01 DIAGNOSIS — M545 Low back pain, unspecified: Secondary | ICD-10-CM

## 2017-07-01 NOTE — Therapy (Signed)
[place] [address], [place], [address] Phone: [phone]   Fax:  [phone]  Physical Therapy Treatment  Patient Details  Name: Lynn Kim MRN: [id number] Date of Birth: 17-Jul-1941 Referring Provider: Harlan Stains  Encounter Date: 07/01/2017      PT End of Session - 07/01/17 1336    Visit Number 4   Date for PT Re-Evaluation 07/27/17   PT Start Time 1300   PT Stop Time 1400   PT Time Calculation (min) 60 min   Activity Tolerance Patient tolerated treatment well   Behavior During Therapy [facility] for tasks assessed/performed      Past Medical History:  Diagnosis Date  . Anxiety    PAST HISTORY  . Arthritis    bilateral hands  . Depression    genetic gene  . Heart murmur    CHILDHOOD FUNCTIONAL HEART MURMUR  . Osteoporosis   . Vertigo     Past Surgical History:  Procedure Laterality Date  . APPENDECTOMY    . BACK SURGERY    . BUNIONECTOMY    . HYSTEROSCOPY W/D&C N/A 06/15/2014   Procedure: DILATATION AND CURETTAGE /HYSTEROSCOPY;  Surgeon: Annalee Genta, DO;  Location: [facility];  Service: Gynecology;  Laterality: N/A;  w/Polypectomy  . left breast cyst removal    . TONSILLECTOMY      There were no vitals filed for this visit.      Subjective Assessment - 07/01/17 1302    Subjective Reports that she has had a cold and been in bed for most of the week.  I think I was getting better but still hurting.   Currently in Pain? Yes   Pain Score 2    Pain Location Hip   Pain Orientation Left   Pain Descriptors / Indicators Aching   Aggravating Factors  stairs and lying on the left side   Pain Relieving Factors "That patch really seemed to help                         [facility] Adult PT Treatment/Exercise - 07/01/17 0001      Ambulation/Gait   Gait Comments stairs step over step     Knee/Hip Exercises: Stretches   Passive Hamstring Stretch 4 reps;20 seconds   Quad  Stretch 4 reps;20 seconds   Hip Flexor Stretch 4 reps;20 seconds   ITB Stretch 4 reps;20 seconds   Piriformis Stretch 4 reps;20 seconds     Knee/Hip Exercises: Aerobic   Elliptical R=5 I=10 x 5 minutes     Knee/Hip Exercises: Machines for Strengthening   Cybex Leg Press 20# 2x7 reps single legs only   Hip Cybex 5# hip abduction x 10, extension 2x10, 70# hip press down     Knee/Hip Exercises: Supine   Other Supine Knee/Hip Exercises feet on ball bridges and then bridge with HS curls     Moist Heat Therapy   Number Minutes Moist Heat 15 Minutes   Moist Heat Location Hip     Electrical Stimulation   Electrical Stimulation Location left SI and anterior hip   Electrical Stimulation Action IFC   Electrical Stimulation Parameters sidelying   Electrical Stimulation Goals Pain     Iontophoresis   Type of Iontophoresis Dexamethasone   Location left GT area   Dose 57m dose   Time 4 hour patch #3     Manual Therapy   Manual Therapy Passive ROM  Passive ROM STM of the left quad and hip flexor area with stretch                  PT Short Term Goals - 06/11/17 9507      PT SHORT TERM GOAL #1   Title independent with initial HEP   Status Achieved           PT Long Term Goals - 06/17/17 1511      PT LONG TERM GOAL #1   Title decrease pain 50%   Status Partially Met     PT LONG TERM GOAL #2   Title Increase strength of the hips to 4/5   Status Partially Met               Plan - 07/01/17 1336    Clinical Impression Statement Patient able to do more today with the left hip exercises wise, less breaks and less c/o pain, reports that stairs are easier.  Still some difficulty in the left anterior hip and left SI area with the stairs   PT Next Visit Plan may see in 1-2 weeks as she continues to progress some activities at home   Consulted and Agree with Plan of Care Patient      Patient will benefit from skilled therapeutic intervention in order to improve  the following deficits and impairments:  Decreased strength, Impaired flexibility, Improper body mechanics, Pain, Increased muscle spasms, Decreased range of motion, Increased fascial restricitons  Visit Diagnosis: Acute left-sided low back pain without sciatica  Stiffness of left hip, not elsewhere classified     Problem List Patient Active Problem List   Diagnosis Date Noted  . Chest pain 11/29/2014    Sumner Boast., PT 07/01/2017, 1:38 PM  [address] [place], [place], [address] Phone: [phone]   Fax:  [phone]  Name: Lynn Kim MRN: [id number] Date of Birth: Sep 10, 1941

## 2017-07-15 ENCOUNTER — Ambulatory Visit: Payer: Medicare Other | Admitting: Physical Therapy

## 2017-07-15 ENCOUNTER — Encounter: Payer: Self-pay | Admitting: Physical Therapy

## 2017-07-15 DIAGNOSIS — M545 Low back pain, unspecified: Secondary | ICD-10-CM

## 2017-07-15 DIAGNOSIS — M25652 Stiffness of left hip, not elsewhere classified: Secondary | ICD-10-CM

## 2017-07-15 NOTE — Therapy (Signed)
[place], [place], [address] Phone: [phone]   Fax:  [phone]  Physical Therapy Treatment  Patient Details  Name: Lynn Kim MRN: [id number] Date of Birth: 05-31-1941 Referring Provider: Harlan Stains  Encounter Date: 07/15/2017      PT End of Session - 07/15/17 1520    Visit Number 5   Date for PT Re-Evaluation 07/27/17   PT Start Time 1438   PT Stop Time 1539   PT Time Calculation (min) 61 min   Activity Tolerance Patient tolerated treatment well   Behavior During Therapy [facility] for tasks assessed/performed      Past Medical History:  Diagnosis Date  . Anxiety    PAST HISTORY  . Arthritis    bilateral hands  . Depression    genetic gene  . Heart murmur    CHILDHOOD FUNCTIONAL HEART MURMUR  . Osteoporosis   . Vertigo     Past Surgical History:  Procedure Laterality Date  . APPENDECTOMY    . BACK SURGERY    . BUNIONECTOMY    . HYSTEROSCOPY W/D&C N/A 06/15/2014   Procedure: DILATATION AND CURETTAGE /HYSTEROSCOPY;  Surgeon: Annalee Genta, DO;  Location: [facility];  Service: Gynecology;  Laterality: N/A;  w/Polypectomy  . left breast cyst removal    . TONSILLECTOMY      There were no vitals filed for this visit.      Subjective Assessment - 07/15/17 1448    Subjective Reports doing pretty good, did not get to the gym early this week as she was on vacation, c/o pain on stairs   Currently in Pain? Yes   Pain Score 2    Pain Location Hip   Pain Orientation Left;Anterior   Aggravating Factors  stairs                         [facility] Adult PT Treatment/Exercise - 07/15/17 0001      Ambulation/Gait   Gait Comments stairs step over step 4 flights     High Level Balance   High Level Balance Comments resisted gait all directions     Knee/Hip Exercises: Stretches   Quad Stretch 4 reps;20 seconds   Hip Flexor Stretch 4 reps;20 seconds     Knee/Hip  Exercises: Aerobic   Elliptical R=5 I=10 x 5 minutes     Knee/Hip Exercises: Machines for Strengthening   Cybex Leg Press 20# 2x10 reps single legs only   Hip Cybex 5# hip abduction x 10, extension 2x10, 70# hip press down, 10# hip flexion   Other Machine seated isometric abdominals with ball     Moist Heat Therapy   Number Minutes Moist Heat 15 Minutes   Moist Heat Location Hip     Electrical Stimulation   Electrical Stimulation Location left SI and anterior hip   Electrical Stimulation Action IFC   Electrical Stimulation Parameters supine   Electrical Stimulation Goals Pain     Iontophoresis   Type of Iontophoresis Dexamethasone   Location left GT area   Dose 45m dose   Time 4 hour patch #4                  PT Short Term Goals - 06/11/17 03343     PT SHORT TERM GOAL #1   Title independent with initial HEP   Status Achieved           PT  Long Term Goals - 07/15/17 1522      PT LONG TERM GOAL #1   Title decrease pain 50%   Status Partially Met     PT LONG TERM GOAL #2   Title Increase strength of the hips to 4/5   Status Achieved               Plan - 07/15/17 1521    Clinical Impression Statement Still doing well overall, some tightness and tenderness in the hip flexor    PT Next Visit Plan may see in 1-2 weeks as she continues to progress some activities at home   Consulted and Agree with Plan of Care Patient      Patient will benefit from skilled therapeutic intervention in order to improve the following deficits and impairments:  Decreased strength, Impaired flexibility, Improper body mechanics, Pain, Increased muscle spasms, Decreased range of motion, Increased fascial restricitons  Visit Diagnosis: Acute left-sided low back pain without sciatica  Stiffness of left hip, not elsewhere classified     Problem List Patient Active Problem List   Diagnosis Date Noted  . Chest pain 11/29/2014    Sumner Boast., PT 07/15/2017,  3:23 PM  [place] [address] [place], [place], [address] Phone: [phone]   Fax:  [phone]  Name: Lynn Kim MRN: [id number] Date of Birth: Jun 06, 1941

## 2017-07-23 ENCOUNTER — Other Ambulatory Visit: Payer: Self-pay | Admitting: Family Medicine

## 2017-07-23 DIAGNOSIS — Z1231 Encounter for screening mammogram for malignant neoplasm of breast: Secondary | ICD-10-CM

## 2017-07-29 ENCOUNTER — Encounter: Payer: Self-pay | Admitting: Physical Therapy

## 2017-07-29 ENCOUNTER — Ambulatory Visit: Payer: Medicare Other | Admitting: Physical Therapy

## 2017-07-29 DIAGNOSIS — M545 Low back pain, unspecified: Secondary | ICD-10-CM

## 2017-07-29 DIAGNOSIS — M25652 Stiffness of left hip, not elsewhere classified: Secondary | ICD-10-CM

## 2017-07-29 NOTE — Therapy (Signed)
[place] [address] [place], [place], [address] Phone: [phone]   Fax:  [phone]  Physical Therapy Treatment  Patient Details  Name: Lynn Kim MRN: [id number] Date of Birth: 1941/06/18 Referring Provider: Harlan Stains  Encounter Date: 07/29/2017      PT End of Session - 07/29/17 1648    Visit Number 6   Date for PT Re-Evaluation 08/27/17   PT Start Time 1440   PT Stop Time 1537   PT Time Calculation (min) 57 min   Activity Tolerance Patient tolerated treatment well   Behavior During Therapy [facility] for tasks assessed/performed      Past Medical History:  Diagnosis Date  . Anxiety    PAST HISTORY  . Arthritis    bilateral hands  . Depression    genetic gene  . Heart murmur    CHILDHOOD FUNCTIONAL HEART MURMUR  . Osteoporosis   . Vertigo     Past Surgical History:  Procedure Laterality Date  . APPENDECTOMY    . BACK SURGERY    . BUNIONECTOMY    . HYSTEROSCOPY W/D&C N/A 06/15/2014   Procedure: DILATATION AND CURETTAGE /HYSTEROSCOPY;  Surgeon: Annalee Genta, DO;  Location: [facility];  Service: Gynecology;  Laterality: N/A;  w/Polypectomy  . left breast cyst removal    . TONSILLECTOMY      There were no vitals filed for this visit.      Subjective Assessment - 07/29/17 1645    Subjective Reports the only issue she has had is steps, especially going down, c/o a "catch", also reports that she could not remember how to do an exercise   Currently in Pain? Yes   Pain Score 2    Pain Location Hip   Pain Orientation Left;Anterior   Aggravating Factors  going down stairs                         [facility] Adult PT Treatment/Exercise - 07/29/17 0001      Ambulation/Gait   Gait Comments stairs step over step 4 flights     Knee/Hip Exercises: Stretches   Sports administrator 4 reps;20 seconds   Hip Flexor Stretch 4 reps;20 seconds     Knee/Hip Exercises: Machines for Strengthening    Cybex Leg Press 20# 2x10 reps single legs only   Hip Cybex 5# hip abduction x 10, extension 2x10, 70# hip press down, 10# hip flexion   Other Machine seated isometric abdominals with ball     Knee/Hip Exercises: Standing   Step Down Step Height: 4";Step Height: 6";20 reps   Step Down Limitations working on only heel touches     Moist Heat Therapy   Number Minutes Moist Heat 15 Minutes   Moist Heat Location Hip     Electrical Stimulation   Electrical Stimulation Location left SI and anterior hip   Electrical Stimulation Action IFC   Electrical Stimulation Parameters supine   Electrical Stimulation Goals Pain     Iontophoresis   Type of Iontophoresis Dexamethasone   Location left GT area   Dose 47m dose   Time 4 hour patch #5                  PT Short Term Goals - 06/11/17 08341     PT SHORT TERM GOAL #1   Title independent with initial HEP   Status Achieved  PT Long Term Goals - 07/29/17 1649      PT LONG TERM GOAL #1   Title decrease pain 50%   Status Partially Met               Plan - 07/29/17 1648    Clinical Impression Statement only issue is anterior hip pain and catching with going down stairs.  Had some difficulty with stretching today   PT Next Visit Plan may see in 1-2 weeks as she continues to progress some activities at home   Consulted and Agree with Plan of Care Patient      Patient will benefit from skilled therapeutic intervention in order to improve the following deficits and impairments:  Decreased strength, Impaired flexibility, Improper body mechanics, Pain, Increased muscle spasms, Decreased range of motion, Increased fascial restricitons  Visit Diagnosis: Acute left-sided low back pain without sciatica - Plan: PT plan of care cert/re-cert  Stiffness of left hip, not elsewhere classified - Plan: PT plan of care cert/re-cert     Problem List Patient Active Problem List   Diagnosis Date Noted  . Chest pain  11/29/2014    Sumner Boast., PT 07/29/2017, 4:52 PM  [place] [address] [place], [place], [address] Phone: [phone]   Fax:  [phone]  Name: Lynn Kim MRN: [id number] Date of Birth: Sep 04, 1941

## 2017-08-06 ENCOUNTER — Ambulatory Visit
Admission: RE | Admit: 2017-08-06 | Discharge: 2017-08-06 | Disposition: A | Discharge status: Home / Self Care | Payer: Medicare Other | Source: Ambulatory Visit | Attending: Family Medicine | Admitting: Family Medicine

## 2017-08-06 ENCOUNTER — Other Ambulatory Visit: Payer: Self-pay | Admitting: Family Medicine

## 2017-08-06 DIAGNOSIS — R0781 Pleurodynia: Secondary | ICD-10-CM

## 2017-08-12 ENCOUNTER — Ambulatory Visit
Admission: RE | Admit: 2017-08-12 | Discharge: 2017-08-12 | Disposition: A | Discharge status: Home / Self Care | Payer: Medicare Other | Source: Ambulatory Visit | Attending: Family Medicine | Admitting: Family Medicine

## 2017-08-12 DIAGNOSIS — Z1231 Encounter for screening mammogram for malignant neoplasm of breast: Secondary | ICD-10-CM

## 2017-08-13 ENCOUNTER — Encounter: Payer: Self-pay | Admitting: Physical Therapy

## 2017-08-13 ENCOUNTER — Ambulatory Visit: Payer: Medicare Other | Attending: Family Medicine | Admitting: Physical Therapy

## 2017-08-13 DIAGNOSIS — M25652 Stiffness of left hip, not elsewhere classified: Secondary | ICD-10-CM | POA: Diagnosis present

## 2017-08-13 DIAGNOSIS — M545 Low back pain, unspecified: Secondary | ICD-10-CM

## 2017-08-13 NOTE — Therapy (Signed)
[address] [place], [place], [address] Phone: [phone]   Fax:  [phone]  Physical Therapy Treatment  Patient Details  Name: Lynn Kim MRN: [id number] Date of Birth: May 01, 1941 Referring Provider: Harlan Stains   Encounter Date: 08/13/2017  PT End of Session - 08/13/17 1452    Visit Number  7    Date for PT Re-Evaluation  08/27/17    PT Start Time  0258    PT Stop Time  1415    PT Time Calculation (min)  67 min    Activity Tolerance  Patient tolerated treatment well    Behavior During Therapy  [facility] for tasks assessed/performed       Past Medical History:  Diagnosis Date  . Anxiety    PAST HISTORY  . Arthritis    bilateral hands  . Depression    genetic gene  . Heart murmur    CHILDHOOD FUNCTIONAL HEART MURMUR  . Osteoporosis   . Vertigo     Past Surgical History:  Procedure Laterality Date  . APPENDECTOMY    . BACK SURGERY    . BREAST EXCISIONAL BIOPSY    . BUNIONECTOMY    . HYSTEROSCOPY W/D&C N/A 06/15/2014   Procedure: DILATATION AND CURETTAGE /HYSTEROSCOPY;  Surgeon: Annalee Genta, DO;  Location: [facility];  Service: Gynecology;  Laterality: N/A;  w/Polypectomy  . left breast cyst removal    . TONSILLECTOMY      There were no vitals filed for this visit.  Subjective Assessment - 08/13/17 1307    Subjective  Patient reports that she has had some left anterior flank pain, saw MD and they felt it was a mm spasm.  X-rays were negative    Currently in Pain?  Yes    Pain Score  2     Pain Location  Hip    Pain Orientation  Left;Anterior                      [facility] Adult PT Treatment/Exercise - 08/13/17 0001      Ambulation/Gait   Gait Comments  stairs step over step 4 flights      High Level Balance   High Level Balance Comments  resisted gait all directions      Knee/Hip Exercises: Stretches   Quad Stretch  4 reps;20 seconds    Hip Flexor Stretch  4  reps;20 seconds    ITB Stretch  4 reps;20 seconds      Knee/Hip Exercises: Aerobic   Elliptical  R=5 I=10 x 5 minutes      Knee/Hip Exercises: Machines for Strengthening   Cybex Leg Press  20# 2x10 reps single legs only    Hip Cybex  5# hip abduction x 10, extension 2x10, 70# hip press down, 10# hip flexion    Other Machine  seated isometric abdominals with ball      Knee/Hip Exercises: Standing   Step Down  Step Height: 4";Step Height: 6";20 reps    Step Down Limitations  working on only heel touches      Iontophoresis   Type of Iontophoresis  Dexamethasone    Location  left GT area    Dose  80m dose    Time  4 hour patch #5      Manual Therapy   Manual Therapy  Passive ROM    Passive ROM  STM of the left quad and hip flexor area with stretch,  ITB rolling               PT Short Term Goals - 06/11/17 0918      PT SHORT TERM GOAL #1   Title  independent with initial HEP    Status  Achieved        PT Long Term Goals - 09/05/17 1453      PT LONG TERM GOAL #1   Title  decrease pain 50%    Status  Achieved      PT LONG TERM GOAL #2   Title  Increase strength of the hips to 4/5    Status  Achieved            Plan - 09-05-17 1452    Clinical Impression Statement  Patient doing very well, reports that she has been doing the exercises that I have given her, she has a few questions and feels comfortable doing on her own.      PT Next Visit Plan  D/C goals met    Consulted and Agree with Plan of Care  Patient       Patient will benefit from skilled therapeutic intervention in order to improve the following deficits and impairments:  Decreased strength, Impaired flexibility, Improper body mechanics, Pain, Increased muscle spasms, Decreased range of motion, Increased fascial restricitons  Visit Diagnosis: Acute left-sided low back pain without sciatica  Stiffness of left hip, not elsewhere classified   G-Codes - 09-05-2017 1456    Functional Assessment  Tool Used (Outpatient Only)  PT decision decreased ROM, difficulty with going down the stairs    Functional Limitation  Mobility: Walking and moving around    Mobility: Walking and Moving Around Current Status ([id number])  At least 20 percent but less than 40 percent impaired, limited or restricted    Mobility: Walking and Moving Around Goal Status [phone])  At least 20 percent but less than 40 percent impaired, limited or restricted    Mobility: Walking and Moving Around Discharge Status [phone])  At least 20 percent but less than 40 percent impaired, limited or restricted       Problem List Patient Active Problem List   Diagnosis Date Noted  . Chest pain 11/29/2014    Sumner Boast., PT September 05, 2017, 2:57 PM  [address] [place], [place], [address] Phone: [phone]   Fax:  [phone]  Name: Lynn Kim MRN: [id number] Date of Birth: 02-11-1941

## 2017-10-19 ENCOUNTER — Ambulatory Visit: Payer: Medicare Other | Admitting: Allergy and Immunology

## 2017-10-22 ENCOUNTER — Ambulatory Visit: Payer: Medicare Other | Admitting: Allergy

## 2017-11-16 ENCOUNTER — Ambulatory Visit: Payer: Medicare Other | Admitting: Allergy

## 2017-11-16 ENCOUNTER — Encounter: Payer: Self-pay | Admitting: Allergy

## 2017-11-16 VITALS — BP 122/76 | HR 79 | Temp 98.0°F | Resp 18 | Ht 63.0 in | Wt 124.6 lb

## 2017-11-16 DIAGNOSIS — J310 Chronic rhinitis: Secondary | ICD-10-CM | POA: Diagnosis not present

## 2017-11-16 MED ORDER — AZELASTINE HCL 0.1 % NA SOLN: 2.0000 | Freq: Two times a day (BID) | NASAL | 5 refills | Status: AC

## 2017-11-16 NOTE — Patient Instructions (Addendum)
Chronic rhinitis    - allergic rhinitis with positive skin testing today molds (fusarium, pullulria and rhizopus) and tobacco leaf.  Allergen avoidance measures discussed and provided.      - continue Loratidine 10mg  daily    - start Astelin, nasal antihistamine, 2 sprays each nostril twice a day for nasal drainage/runny nose    - if you have nasal congestion/stuffy nose use your Fluticasone 2 sprays each nostril daily   Follow-up 6 months or sooner if needed

## 2017-11-16 NOTE — Progress Notes (Signed)
New Patient Note  RE: Lynn Kim MRN: [id number] DOB: 02-04-1941 Date of Office Visit: 11/16/2017  Referring provider: Laurann Montana, MD Primary care provider: Laurann Montana, MD  Chief Complaint: Allergies  History of present illness: Lynn Kim is a 77 y.o. female presenting today for evaluation of environmental allergies. She reports she has had year round allergies, though exacerbated by seasonal changes, for 15+ years. She takes loratadine and Flonase daily for these symptoms. She had a worse exacerbation in the fall and decided she wanted to be tested for environmental allergies to try and avoid further flares if possible. She has been off loratadine for the past 4 days and reports a noticeable worsening of her sinus congestions and symptoms including primarily postnasal drip.  She is also very concerned about mold allergy as she believes she may have mold in her bathroom as she can see black mold or mildew around her toilet.  She also reports a history of food allergies which she has noted symptoms for the past several years. She notes being tested with blood testing for various food allergies in the past. These appear to have been IgG levels that were tested to variety of different foods. She notes several foods she avoids due to symptoms. She says that she becomes bloated with loose floating stools after eating gluten, abdominal pain from almonds after 15 minutes of ingestion, becoming gassy from legumes, feeling bloated after eating more than one serving of dairy. She also notes being allergic to cane sugar though cannot identify any symptoms and avoids it altogether. She also notes local swelling at the site of flu shots in the past and being told she is allergic to eggs. She reports eating eggs without any symptoms however.  She denies any history of asthma or eczema.  Review of systems: Review of Systems  Constitutional: Negative for chills, fever and malaise/fatigue.    HENT: Positive for congestion. Negative for ear discharge, ear pain, nosebleeds, sinus pain and sore throat.   Eyes: Negative for pain, discharge and redness.  Respiratory: Negative for cough, shortness of breath and wheezing.   Cardiovascular: Negative for chest pain.  Gastrointestinal: Negative for abdominal pain, constipation, diarrhea, heartburn, nausea and vomiting.  Musculoskeletal: Positive for joint pain.  Skin: Negative for itching and rash.  Neurological: Negative for headaches.    All other systems negative unless noted above in HPI  Past medical history: Past Medical History:  Diagnosis Date  . Anxiety    PAST HISTORY  . Arthritis    bilateral hands  . Depression    genetic gene  . Heart murmur    CHILDHOOD FUNCTIONAL HEART MURMUR  . Osteoporosis   . Vertigo     Past surgical history: Past Surgical History:  Procedure Laterality Date  . APPENDECTOMY    . BACK SURGERY    . BREAST EXCISIONAL BIOPSY    . BUNIONECTOMY    . HYSTEROSCOPY W/D&C N/A 06/15/2014   Procedure: DILATATION AND CURETTAGE /HYSTEROSCOPY;  Surgeon: Sharon Seller, DO;  Location: [facility];  Service: Gynecology;  Laterality: N/A;  w/Polypectomy  . left breast cyst removal    . TONSILLECTOMY      Family history:  Family History  Problem Relation Age of Onset  . Asthma Sister     Social history: She lives in a home with carpeting with gas heating and central cooling.  There is a cat in the home.  There is concern for water damage and mildew in  the home.  There is no concern for roaches.  She is retired. . Smoking status: Former Smoker  . Smokeless tobacco: Former Neurosurgeon    Quit date: 04/06/1963    Medication List: Allergies as of 11/16/2017      Reactions   Eggs Or Egg-derived Products Nausea And Vomiting   Food    Almonds, cane sugar, grains    Erythromycin Ethylsuccinate Rash   Lanolin Rash   Penicillins Rash   Petrolatum Rash   Sertraline Hcl Anxiety   Sulfa Antibiotics Rash       Medication List        Accurate as of 11/16/17  5:23 PM. Always use your most recent med list.          ALIGN PO Take 1 capsule by mouth 1 day or 1 dose.   BERBERINE COMPLEX PO Take 500 mg by mouth 2 (two) times daily.   CALCIUM-MAGNESIUM-VITAMIN D PO Take 1 tablet by mouth 2 (two) times daily.   cyanocobalamin 2000 MCG tablet Take 2,000 mcg by mouth daily.   DHEA PO Take 5 mg by mouth daily.   DIGESTIVE ENZYME PO Take 1 tablet by mouth 2 (two) times daily before lunch and supper.   fluticasone 50 MCG/ACT nasal spray Commonly known as:  FLONASE Place into both nostrils daily.   guaiFENesin 600 MG 12 hr tablet Commonly known as:  MUCINEX Take by mouth 2 (two) times daily.   L-Theanine 100 MG Caps Take 100 mg by mouth 2 (two) times daily.   loratadine 10 MG tablet Commonly known as:  CLARITIN Take 10 mg by mouth daily.   multivitamin tablet Take 1 tablet by mouth daily.   PRESCRIPTION MEDICATION Apply 1 application topically daily. Bi-est cream - compounded at [facility]   PRESCRIPTION MEDICATION Apply 1 application topically 3 (three) times a week. Estriol - Testosterone Cream - three times weekly (Sunday, Tuesday, Friday) Compounded at [facility]   PRESCRIPTION MEDICATION Take 1 capsule by mouth daily. Progesterone SR 100 mg capsules - compounded at [facility]   Turmeric Curcumin 500 MG Caps Take 1 capsule by mouth daily.   VIACTIV 500-500-40 MG-UNT-MCG Chew Generic drug:  Calcium-Vitamin D-Vitamin K Chew 1 tablet by mouth daily with lunch.   Vitamin D3 2000 units Tabs Take 1 tablet by mouth 2 (two) times daily.   VITAMIN K2 PO Take by mouth.       Known medication allergies: Allergies  Allergen Reactions  . Eggs Or Egg-Derived Products Nausea And Vomiting  . Food     Almonds, cane sugar, grains   . Erythromycin Ethylsuccinate Rash  . Lanolin Rash  . Penicillins Rash  . Petrolatum Rash  .  Sertraline Hcl Anxiety  . Sulfa Antibiotics Rash     Physical examination: Blood pressure 122/76, pulse 79, temperature 98 F (36.7 C), resp. rate 18, height 5\' 3"  (1.6 m), weight 124 lb 9.6 oz (56.5 kg), SpO2 96 %.  General: Alert, interactive, in no acute distress. HEENT: PERRLA, TMs pearly gray, turbinates mildly edematous with clear discharge, post-pharynx non erythematous. Neck: Supple without lymphadenopathy. Lungs: Clear to auscultation without wheezing, rhonchi or rales. {no increased work of breathing. CV: Normal S1, S2 without murmurs. Abdomen: Nondistended, nontender. Skin: Warm and dry, without lesions or rashes. Extremities:  No clubbing, cyanosis or edema. Neuro:   Grossly intact.  Diagnositics/Labs:  Allergy testing: Environmental skin prick testing was positive to tobacco leaf.  Intradermal skin testing was positive to mold  mix 4.  Select prick testing was negative to peanut, wheat, milk, almond, navy bean Allergy testing results were read and interpreted by provider, documented by clinical staff.   Assessment and plan:   Chronic rhinitis    - allergic rhinitis with positive skin testing today molds (fusarium, pullulria and rhizopus) and tobacco leaf.  Allergen avoidance measures discussed and provided.      -I also feel she has a nonallergic component as she does seem to be symptomatic with irritants as well.    - continue Loratidine 10mg  daily    - start Astelin, nasal antihistamine, 2 sprays each nostril twice a day for nasal drainage/runny nose    - if you have nasal congestion/stuffy nose use your Fluticasone 2 sprays each nostril daily    -She may also benefit from nasal saline rinses as well  Follow-up 6 months or sooner if needed   I appreciate the opportunity to take part in Lynn Kim's care. Please do not hesitate to contact me with questions.  Sincerely,   Margo AyeShaylar Wilmont Olund, MD Allergy/Immunology [facility]

## 2018-01-28 ENCOUNTER — Other Ambulatory Visit: Payer: Self-pay | Admitting: Family Medicine

## 2018-01-28 ENCOUNTER — Ambulatory Visit
Admission: RE | Admit: 2018-01-28 | Discharge: 2018-01-28 | Disposition: A | Discharge status: Home / Self Care | Payer: Medicare Other | Source: Ambulatory Visit | Attending: Family Medicine | Admitting: Family Medicine

## 2018-01-28 DIAGNOSIS — R079 Chest pain, unspecified: Secondary | ICD-10-CM

## 2018-02-04 ENCOUNTER — Ambulatory Visit: Payer: Medicare Other | Attending: Family Medicine | Admitting: Physical Therapy

## 2018-02-04 ENCOUNTER — Encounter: Payer: Self-pay | Admitting: Physical Therapy

## 2018-02-04 DIAGNOSIS — M25511 Pain in right shoulder: Secondary | ICD-10-CM

## 2018-02-04 DIAGNOSIS — M62838 Other muscle spasm: Secondary | ICD-10-CM | POA: Diagnosis present

## 2018-02-04 NOTE — Therapy (Signed)
[facility] [address] [place], [place], [address] Phone: [phone]   Fax:  [phone]  Physical Therapy Evaluation  Patient Details  Name: Lynn Kim MRN: [id number] Date of Birth: 1941-03-11 Referring Provider: Laurann Montana   Encounter Date: 02/04/2018  PT End of Session - 02/04/18 1019    Visit Number  1    Date for PT Re-Evaluation  04/06/18    PT Start Time  0915    PT Stop Time  1015    PT Time Calculation (min)  60 min    Activity Tolerance  Patient tolerated treatment well    Behavior During Therapy  [facility] for tasks assessed/performed       Past Medical History:  Diagnosis Date  . Anxiety    PAST HISTORY  . Arthritis    bilateral hands  . Depression    genetic gene  . Heart murmur    CHILDHOOD FUNCTIONAL HEART MURMUR  . Osteoporosis   . Vertigo     Past Surgical History:  Procedure Laterality Date  . APPENDECTOMY    . BACK SURGERY    . BREAST EXCISIONAL BIOPSY    . BUNIONECTOMY    . HYSTEROSCOPY W/D&C N/A 06/15/2014   Procedure: DILATATION AND CURETTAGE /HYSTEROSCOPY;  Surgeon: Sharon Seller, DO;  Location: [facility];  Service: Gynecology;  Laterality: N/A;  w/Polypectomy  . left breast cyst removal    . TONSILLECTOMY      There were no vitals filed for this visit.   Subjective Assessment - 02/04/18 0913    Subjective  Patient reports that she has had some right sided chest pain, points to the right side of the sternum for "months".  X-rays are negative.  She does exercises and does work out her chest, she is unsure of what the cause is.      Limitations  Lifting    Patient Stated Goals  have less pain    Currently in Pain?  Yes    Pain Score  3     Pain Location  Sternum    Pain Orientation  Right    Pain Descriptors / Indicators  Tingling;Aching    Pain Type  Acute pain    Pain Radiating Towards  reports some pain in the right nipple area, has some pain     Pain Onset  More  than a month ago    Pain Frequency  Constant    Aggravating Factors   reports pain iwth deep breathing, reports different bras cause increased pain, up to 8/10    Pain Relieving Factors  wearing a bathing suit gives a different support pain down to 2/10    Effect of Pain on Daily Activities  reports difficulty getting comfortable, has stopped some of the exercises         [facility] PT Assessment - 02/04/18 0001      Assessment   Medical Diagnosis  right chest pain    Referring Provider  Laurann Montana    Onset Date/Surgical Date  01/05/18    Hand Dominance  Right    Prior Therapy  for other things      Precautions   Precautions  None      Balance Screen   Has the patient fallen in the past 6 months  No    Has the patient had a decrease in activity level because of a fear of falling?   No    Is the  patient reluctant to leave their home because of a fear of falling?   No      Home Environment   Additional Comments  has stairs, does a lot of yardwork      Prior Function   Level of Independence  Independent    Vocation  Retired    Leisure  exercises 5 days a week      Posture/Postural Control   Posture Comments  mild forward head      ROM / Strength   AROM / PROM / Strength  AROM;Strength      AROM   Overall AROM Comments  Shoulder AROM was limited to 140 degrees flexion/ abduction, ER to 60 degrees with some anterior shoulder ppain and pectoral pain.      Strength   Overall Strength Comments  shoulders 4-/5 with some pain for shoudler adduction      Flexibility   Soft Tissue Assessment /Muscle Length  -- has some pectoral tightness      Palpation   Palpation comment  patient has some spasms and tenderness in the right pectoral area, the intercostals are very tender 4th-7th ribs at the sternal junction, she reports that a massage therapist told her it seemed "out of place" last week, it is a little more prominent on this side today.  deep breathing and resisted deep brething  cause a little pain.      Special Tests   Other special tests  minimal pain iwth resisted deep breathing                Objective measurements completed on examination: See above findings.      [facility] Adult PT Treatment/Exercise - 02/04/18 0001      Manual Therapy   Manual Therapy  Soft tissue mobilization;Joint mobilization    Joint Mobilization  some deep breathing and resisted deep breathing    Soft tissue mobilization  right pectoral area, right intercostals             PT Education - 02/04/18 1018    Education provided  Yes    Education Details  wall angels, star gazer and corner stretch    Person(s) Educated  Patient    Methods  Explanation;Demonstration;Verbal cues    Comprehension  Verbalized understanding;Returned demonstration       PT Short Term Goals - 02/04/18 1332      PT SHORT TERM GOAL #1   Title  independent with initial HEP    Time  2    Period  Weeks    Status  New        PT Long Term Goals - 02/04/18 1332      PT LONG TERM GOAL #1   Title  decrease pain 50%    Time  8    Period  Weeks    Status  New      PT LONG TERM GOAL #2   Title  report able to reach overhead and not have pain    Time  8    Period  Weeks    Status  New      PT LONG TERM GOAL #3   Title  report be able to sleep throught the night and not wear a swimsuit to stop the pain    Time  8    Period  Weeks    Status  New      PT LONG TERM GOAL #4   Title  resume gym activities  Time  8    Period  Weeks    Status  New             Plan - 02/04/18 1322    Clinical Impression Statement  Patient reports that she has right chest pain, she is unsure of a specific cause of the pain, x-rays negative, she is tight in the pectoral area, she is very tender here as well, tender in the costosternal junction and in the intercostals from T4-8.  She has had some "zinging" pain from this area out and reports at times has had some nipple pain, I questions her about  recent mammogram, she had one 4 months ago and it was negative, she did tell me that she is taking some hormones.  Denies swelling and denies andy rash or shingles type symptoms.  She has some discomfort with resisted deep breathing.  Seems to be mostly pectoral tightness. possibly sternocostal issue.    Clinical Presentation  Evolving    Clinical Decision Making  Low    Rehab Potential  Good    PT Frequency  2x / week    PT Duration  8 weeks    PT Treatment/Interventions  Electrical Stimulation;Moist Heat;Iontophoresis /ml Dexamethasone;Therapeutic exercise;Therapeutic activities;Patient/family education;Manual techniques    PT Next Visit Plan  could try mobilization, modalities and try to start some gym exercises    Consulted and Agree with Plan of Care  Patient       Patient will benefit from skilled therapeutic intervention in order to improve the following deficits and impairments:  Decreased range of motion, Increased fascial restricitons, Impaired UE functional use, Increased muscle spasms, Pain, Impaired flexibility, Decreased strength  Visit Diagnosis: Acute pain of right shoulder - Plan: PT plan of care cert/re-cert  Other muscle spasm - Plan: PT plan of care cert/re-cert     Problem List Patient Active Problem List   Diagnosis Date Noted  . Chest pain 11/29/2014    Jearld Lesch., PT 02/04/2018, 1:35 PM  [facility] [place] [facility] [address] [place], [place], [address] Phone: [phone]   Fax:  [phone]  Name: Lynn Kim MRN: [id number] Date of Birth: 1941/09/26

## 2018-02-12 ENCOUNTER — Encounter: Payer: Self-pay | Admitting: Physical Therapy

## 2018-02-12 ENCOUNTER — Ambulatory Visit: Payer: Medicare Other | Admitting: Physical Therapy

## 2018-02-12 DIAGNOSIS — M25511 Pain in right shoulder: Secondary | ICD-10-CM | POA: Diagnosis not present

## 2018-02-12 DIAGNOSIS — M62838 Other muscle spasm: Secondary | ICD-10-CM

## 2018-02-12 NOTE — Therapy (Signed)
[facility] [place] [address] [place], [place], [address] Phone: [phone]   Fax:  [phone]  Physical Therapy Treatment  Patient Details  Name: Lynn Kim MRN: [id number] Date of Birth: 03-16-1941 Referring Provider: Laurann Montana   Encounter Date: 02/12/2018  PT End of Session - 02/12/18 0932    Visit Number  2    Date for PT Re-Evaluation  04/06/18    PT Start Time  0840    PT Stop Time  0945    PT Time Calculation (min)  65 min    Activity Tolerance  Patient tolerated treatment well    Behavior During Therapy  [facility] for tasks assessed/performed       Past Medical History:  Diagnosis Date  . Anxiety    PAST HISTORY  . Arthritis    bilateral hands  . Depression    genetic gene  . Heart murmur    CHILDHOOD FUNCTIONAL HEART MURMUR  . Osteoporosis   . Vertigo     Past Surgical History:  Procedure Laterality Date  . APPENDECTOMY    . BACK SURGERY    . BREAST EXCISIONAL BIOPSY    . BUNIONECTOMY    . HYSTEROSCOPY W/D&C N/A 06/15/2014   Procedure: DILATATION AND CURETTAGE /HYSTEROSCOPY;  Surgeon: Sharon Seller, DO;  Location: [facility];  Service: Gynecology;  Laterality: N/A;  w/Polypectomy  . left breast cyst removal    . TONSILLECTOMY      There were no vitals filed for this visit.  Subjective Assessment - 02/12/18 0845    Subjective  Patient reports that she has been doing the HEP, she has done a few things at the gym but not tried her normal routine.    Currently in Pain?  Yes    Pain Score  2     Pain Location  Sternum    Pain Orientation  Right                       [facility] Adult PT Treatment/Exercise - 02/12/18 0001      Exercises   Exercises  Shoulder      Shoulder Exercises: Standing   Other Standing Exercises  pulleys 5# scapular retraction 2 wayss 2x10 each      Shoulder Exercises: ROM/Strengthening   UBE (Upper Arm Bike)  Level 5 x 5 minutes    Lat Pull  20 reps;2  plate    Lat Pull Limitations  20#    Cybex Press  1 plate;20 reps    Cybex Row  20 reps;2 plate    Wall Wash  10 back to wall with assist    "W" Arms  10 reps      Manual Therapy   Manual therapy comments  myofascial release with head and trunk turns with manual fascial     Joint Mobilization  some deep breathing and resisted deep breathing    Soft tissue mobilization  right pectoral area, right intercostals               PT Short Term Goals - 02/12/18 0934      PT SHORT TERM GOAL #1   Title  independent with initial HEP    Status  Achieved        PT Long Term Goals - 02/04/18 1332      PT LONG TERM GOAL #1   Title  decrease pain 50%    Time  8  Period  Weeks    Status  New      PT LONG TERM GOAL #2   Title  report able to reach overhead and not have pain    Time  8    Period  Weeks    Status  New      PT LONG TERM GOAL #3   Title  report be able to sleep throught the night and not wear a swimsuit to stop the pain    Time  8    Period  Weeks    Status  New      PT LONG TERM GOAL #4   Title  resume gym activities    Time  8    Period  Weeks    Status  New            Plan - 02/12/18 0932    Clinical Impression Statement  Patient reports doing better, she reports that she has been at the gym doing very light weights, had a lot of quesitons and we answered a lot of them and how, how much weight, form, what to look out for, to breath into the stretch, she remains tight in the anterior shoulder and along the rib area, where the mms attach to the ribs,  very tender intercostals    PT Next Visit Plan  continue with treatment    Consulted and Agree with Plan of Care  Patient       Patient will benefit from skilled therapeutic intervention in order to improve the following deficits and impairments:  Decreased range of motion, Increased fascial restricitons, Impaired UE functional use, Increased muscle spasms, Pain, Impaired flexibility, Decreased  strength  Visit Diagnosis: Acute pain of right shoulder  Other muscle spasm     Problem List Patient Active Problem List   Diagnosis Date Noted  . Chest pain 11/29/2014    Jearld Lesch., PT 02/12/2018, 9:35 AM  [facility] [place] [address] [place], [place], [address] Phone: [phone]   Fax:  [phone]  Name: Lynn Kim MRN: [id number] Date of Birth: 1941/03/28

## 2018-02-18 ENCOUNTER — Ambulatory Visit: Payer: Medicare Other | Admitting: Physical Therapy

## 2018-02-18 ENCOUNTER — Encounter: Payer: Self-pay | Admitting: Physical Therapy

## 2018-02-18 DIAGNOSIS — M25511 Pain in right shoulder: Secondary | ICD-10-CM

## 2018-02-18 DIAGNOSIS — M62838 Other muscle spasm: Secondary | ICD-10-CM

## 2018-02-18 NOTE — Therapy (Signed)
[facility] [place] [address] [place], [place], [address] Phone: [phone]   Fax:  [phone]  Physical Therapy Treatment  Patient Details  Name: Lynn Kim MRN: [id number] Date of Birth: 1941/04/27 Referring Provider: Laurann Montana   Encounter Date: 02/18/2018  PT End of Session - 02/18/18 1236    Visit Number  3    Date for PT Re-Evaluation  04/06/18    PT Start Time  1005    PT Stop Time  1100    PT Time Calculation (min)  55 min    Activity Tolerance  Patient tolerated treatment well    Behavior During Therapy  [facility] for tasks assessed/performed       Past Medical History:  Diagnosis Date  . Anxiety    PAST HISTORY  . Arthritis    bilateral hands  . Depression    genetic gene  . Heart murmur    CHILDHOOD FUNCTIONAL HEART MURMUR  . Osteoporosis   . Vertigo     Past Surgical History:  Procedure Laterality Date  . APPENDECTOMY    . BACK SURGERY    . BREAST EXCISIONAL BIOPSY    . BUNIONECTOMY    . HYSTEROSCOPY W/D&C N/A 06/15/2014   Procedure: DILATATION AND CURETTAGE /HYSTEROSCOPY;  Surgeon: Sharon Seller, DO;  Location: [facility];  Service: Gynecology;  Laterality: N/A;  w/Polypectomy  . left breast cyst removal    . TONSILLECTOMY      There were no vitals filed for this visit.  Subjective Assessment - 02/18/18 1047    Subjective  Patient reports that she has been doing okay until Tuesday rolling over in bed and feeling some pai in the right sternal area.  She also repors some right scapular pain form the UBE    Currently in Pain?  Yes    Pain Score  4     Pain Location  Sternum    Pain Orientation  Right    Aggravating Factors   UBE, rolling over in bed    Pain Relieving Factors  treatment helps                       [facility] Adult PT Treatment/Exercise - 02/18/18 0001      Modalities   Modalities  Moist Heat;Electrical Stimulation      Moist Heat Therapy   Number Minutes  Moist Heat  15 Minutes    Moist Heat Location  Shoulder      Electrical Stimulation   Electrical Stimulation Location  right rhpmboid/upper trap    Electrical Stimulation Action  IFC    Electrical Stimulation Parameters  supine    Electrical Stimulation Goals  Pain      Manual Therapy   Manual Therapy  Soft tissue mobilization;Joint mobilization    Manual therapy comments  myofascial release with head and trunk turns with manual fascial     Joint Mobilization  some deep breathing and resisted deep breathing    Soft tissue mobilization  right pectoral area, right intercostals, right upper trap, rhomboid and teres               PT Short Term Goals - 02/12/18 0934      PT SHORT TERM GOAL #1   Title  independent with initial HEP    Status  Achieved        PT Long Term Goals - 02/18/18 1239      PT LONG  TERM GOAL #1   Title  decrease pain 50%    Status  On-going      PT LONG TERM GOAL #2   Title  report able to reach overhead and not have pain    Status  On-going      PT LONG TERM GOAL #3   Title  report be able to sleep throught the night and not wear a swimsuit to stop the pain    Status  On-going            Plan - 02/18/18 1236    Clinical Impression Statement  Patient reports some increased chest and rhomboid pain, she feels the UBE and turning over in bed caused the problem to increase,  She has increased spasms in the upper trap, the rhomboid and teres, we elected to not exercise today to calm this down,  She had the 4th rib area that was raisefd some and tender near the sternum    PT Next Visit Plan  continue with treatment    Consulted and Agree with Plan of Care  Patient       Patient will benefit from skilled therapeutic intervention in order to improve the following deficits and impairments:  Decreased range of motion, Increased fascial restricitons, Impaired UE functional use, Increased muscle spasms, Pain, Impaired flexibility, Decreased  strength  Visit Diagnosis: Acute pain of right shoulder  Other muscle spasm     Problem List Patient Active Problem List   Diagnosis Date Noted  . Chest pain 11/29/2014    Jearld Lesch., PT 02/18/2018, 12:40 PM  [facility] [place] [facility] [address] [place], [place], [address] Phone: [phone]   Fax:  [phone]  Name: Lynn Kim MRN: [id number] Date of Birth: [birth year]

## 2018-02-25 ENCOUNTER — Ambulatory Visit: Payer: Medicare Other | Admitting: Physical Therapy

## 2018-02-25 ENCOUNTER — Encounter: Payer: Self-pay | Admitting: Physical Therapy

## 2018-02-25 DIAGNOSIS — M62838 Other muscle spasm: Secondary | ICD-10-CM

## 2018-02-25 DIAGNOSIS — M25511 Pain in right shoulder: Secondary | ICD-10-CM

## 2018-02-25 NOTE — Therapy (Signed)
[facility] [place], [place], [address] Phone: [phone]   Fax:  [phone]  Physical Therapy Treatment  Patient Details  Name: Lynn Kim MRN: [id number] Date of Birth: 1941-09-29 Referring Provider: Harlan Stains   Encounter Date: 02/25/2018  PT End of Session - 02/25/18 1105    Visit Number  4    Date for PT Re-Evaluation  04/06/18    PT Start Time  0928    PT Stop Time  1017    PT Time Calculation (min)  49 min    Activity Tolerance  Patient tolerated treatment well    Behavior During Therapy  [facility] for tasks assessed/performed       Past Medical History:  Diagnosis Date  . Anxiety    PAST HISTORY  . Arthritis    bilateral hands  . Depression    genetic gene  . Heart murmur    CHILDHOOD FUNCTIONAL HEART MURMUR  . Osteoporosis   . Vertigo     Past Surgical History:  Procedure Laterality Date  . APPENDECTOMY    . BACK SURGERY    . BREAST EXCISIONAL BIOPSY    . BUNIONECTOMY    . HYSTEROSCOPY W/D&C N/A 06/15/2014   Procedure: DILATATION AND CURETTAGE /HYSTEROSCOPY;  Surgeon: Annalee Genta, DO;  Location: [facility];  Service: Gynecology;  Laterality: N/A;  w/Polypectomy  . left breast cyst removal    . TONSILLECTOMY      There were no vitals filed for this visit.  Subjective Assessment - 02/25/18 0943    Subjective  Reports that she is doing better, still having some cramping in the rib area    Currently in Pain?  Yes    Pain Score  2     Pain Location  Sternum    Pain Orientation  Right    Aggravating Factors   pushing                       [facility] Adult PT Treatment/Exercise - 02/25/18 0001      Shoulder Exercises: Seated   Other Seated Exercises  2# bent over row and extension      Shoulder Exercises: Standing   Other Standing Exercises  wand exerxises with PT overpressure    Other Standing Exercises  red tband horizontal adduction      Shoulder  Exercises: ROM/Strengthening   Lat Pull  20 reps;2 plate    Lat Pull Limitations  15#    Cybex Press  1 plate;20 reps    Cybex Row  20 reps;2 plate    Wall Wash  10 back to wall with assist    "W" Arms  20 reps    Other ROM/Strengthening Exercises  back to wall arms overhead reaching      Shoulder Exercises: Stretch   Corner Stretch  2 reps;20 seconds      Moist Heat Therapy   Number Minutes Moist Heat  15 Minutes    Moist Heat Location  Shoulder      Electrical Stimulation   Electrical Stimulation Location  right rhpmboid/upper trap    Electrical Stimulation Action  IFC    Electrical Stimulation Parameters  supine    Electrical Stimulation Goals  Pain      Manual Therapy   Manual Therapy  Soft tissue mobilization;Joint mobilization    Manual therapy comments  myofascial release with head and trunk turns with manual fascial  Joint Mobilization  some deep breathing and resisted deep breathing    Soft tissue mobilization  right pectoral area, right intercostals, right upper trap, rhomboid and teres               PT Short Term Goals - 02/12/18 0934      PT SHORT TERM GOAL #1   Title  independent with initial HEP    Status  Achieved        PT Long Term Goals - 02/25/18 1109      PT LONG TERM GOAL #1   Title  decrease pain 50%    Status  Partially Met      PT LONG TERM GOAL #4   Title  resume gym activities    Status  Partially Met            Plan - 02/25/18 1105    Clinical Impression Statement  Patient seemed to do well with the added exercises, had some rib pain with over head activities.  She has the last ribs that do flare out and she has some c/o pain in this area.   She is tender in the intercostals at 4-5-6.  Needs cues to help with posture during exercises    PT Next Visit Plan  continue with treatment    Consulted and Agree with Plan of Care  Patient       Patient will benefit from skilled therapeutic intervention in order to improve the  following deficits and impairments:  Decreased range of motion, Increased fascial restricitons, Impaired UE functional use, Increased muscle spasms, Pain, Impaired flexibility, Decreased strength  Visit Diagnosis: Acute pain of right shoulder  Other muscle spasm     Problem List Patient Active Problem List   Diagnosis Date Noted  . Chest pain 11/29/2014    Sumner Boast., PT 02/25/2018, 11:10 AM  [address] [place], [place], [address] Phone: [phone]   Fax:  [phone]  Name: Lynn Kim MRN: [id number] Date of Birth: 1941-09-04

## 2018-03-11 ENCOUNTER — Encounter: Payer: Self-pay | Admitting: Physical Therapy

## 2018-03-11 ENCOUNTER — Ambulatory Visit: Payer: Medicare Other | Attending: Family Medicine | Admitting: Physical Therapy

## 2018-03-11 DIAGNOSIS — M25511 Pain in right shoulder: Secondary | ICD-10-CM | POA: Diagnosis present

## 2018-03-11 DIAGNOSIS — M62838 Other muscle spasm: Secondary | ICD-10-CM | POA: Insufficient documentation

## 2018-03-11 NOTE — Therapy (Signed)
[place], [place], [address] Phone: [phone]   Fax:  [phone]  Physical Therapy Treatment  Patient Details  Name: Lynn Kim MRN: [id number] Date of Birth: [birth year] Referring Provider: Harlan Stains   Encounter Date: 03/11/2018  PT End of Session - 03/11/18 1008    Visit Number  5    Date for PT Re-Evaluation  04/06/18    PT Start Time  0924    PT Stop Time  1023    PT Time Calculation (min)  59 min    Activity Tolerance  Patient tolerated treatment well    Behavior During Therapy  [facility] for tasks assessed/performed       Past Medical History:  Diagnosis Date  . Anxiety    PAST HISTORY  . Arthritis    bilateral hands  . Depression    genetic gene  . Heart murmur    CHILDHOOD FUNCTIONAL HEART MURMUR  . Osteoporosis   . Vertigo     Past Surgical History:  Procedure Laterality Date  . APPENDECTOMY    . BACK SURGERY    . BREAST EXCISIONAL BIOPSY    . BUNIONECTOMY    . HYSTEROSCOPY W/D&C N/A 06/15/2014   Procedure: DILATATION AND CURETTAGE /HYSTEROSCOPY;  Surgeon: Annalee Genta, DO;  Location: [place] [facility];  Service: Gynecology;  Laterality: N/A;  w/Polypectomy  . left breast cyst removal    . TONSILLECTOMY      There were no vitals filed for this visit.  Subjective Assessment - 03/11/18 0925    Subjective  Patient reports that she is doing significantly better, less pain, has a lot of questions about returning to PLOF at the gym    Currently in Pain?  Yes    Pain Score  1     Pain Location  Sternum    Pain Orientation  Right    Aggravating Factors   pushing                       [facility] Adult PT Treatment/Exercise - 03/11/18 0001      Shoulder Exercises: ROM/Strengthening   UBE (Upper Arm Bike)  Level 5 x 5 minutes    Lat Pull  20 reps;2 plate    Lat Pull Limitations  20#    Cybex Press  1.5 plate;20 reps    Cybex Row  2 plate;20 reps    Wall  Wash  10 back to wall with assist    "W" Arms  20 reps    Other ROM/Strengthening Exercises  3# UE circuit      Shoulder Exercises: Stretch   Corner Stretch  2 reps;20 seconds    Star Gazer Stretch  2 reps;20 seconds      Moist Heat Therapy   Number Minutes Moist Heat  15 Minutes    Moist Heat Location  Shoulder      Electrical Stimulation   Electrical Stimulation Location  right rhpmboid/upper trap    Electrical Stimulation Action  IFC    Electrical Stimulation Parameters  supine    Electrical Stimulation Goals  Pain               PT Short Term Goals - 02/12/18 0934      PT SHORT TERM GOAL #1   Title  independent with initial HEP    Status  Achieved        PT Long Term Goals - 03/11/18  [place] #1   Title  decrease pain 50%    Status  Achieved      PT LONG TERM GOAL #2   Title  report able to reach overhead and not have pain    Status  Achieved      PT LONG TERM GOAL #3   Title  report be able to sleep throught the night and not wear a swimsuit to stop the pain    Status  Achieved      PT LONG TERM GOAL #4   Title  resume gym activities    Status  Achieved            Plan - 03/11/18 1009    Clinical Impression Statement  Patient did well with adding exercises she had some slight c/o discomfort in the right pectoral area.  She had a lot of questions that we went over on form and exercises at the gym as well as PT instruction on how to slowly and safely add weight, also went over tweaks that she can do with posture and hand positions to decrease stress    PT Next Visit Plan  D/C with golas met    Consulted and Agree with Plan of Care  Patient       Patient will benefit from skilled therapeutic intervention in order to improve the following deficits and impairments:  Decreased range of motion, Increased fascial restricitons, Impaired UE functional use, Increased muscle spasms, Pain, Impaired flexibility, Decreased strength  Visit  Diagnosis: Acute pain of right shoulder  Other muscle spasm     Problem List Patient Active Problem List   Diagnosis Date Noted  . Chest pain 11/29/2014    Sumner Boast., PT 03/11/2018, 10:11 AM  [address] [place], [place], [address] Phone: [phone]   Fax:  [phone]  Name: Lynn Kim MRN: [id number] Date of Birth: [birth year]

## 2018-08-13 ENCOUNTER — Other Ambulatory Visit: Payer: Self-pay | Admitting: Family Medicine

## 2018-08-13 ENCOUNTER — Ambulatory Visit
Admission: RE | Admit: 2018-08-13 | Discharge: 2018-08-13 | Disposition: A | Discharge status: Home / Self Care | Payer: Medicare Other | Source: Ambulatory Visit | Attending: Family Medicine | Admitting: Family Medicine

## 2018-08-13 DIAGNOSIS — M542 Cervicalgia: Secondary | ICD-10-CM

## 2018-08-17 ENCOUNTER — Encounter: Payer: Self-pay | Admitting: Physical Therapy

## 2018-08-17 ENCOUNTER — Ambulatory Visit: Payer: Medicare Other | Attending: Family Medicine | Admitting: Physical Therapy

## 2018-08-17 DIAGNOSIS — M62838 Other muscle spasm: Secondary | ICD-10-CM

## 2018-08-17 DIAGNOSIS — M25511 Pain in right shoulder: Secondary | ICD-10-CM

## 2018-08-17 DIAGNOSIS — M542 Cervicalgia: Secondary | ICD-10-CM | POA: Diagnosis present

## 2018-08-17 DIAGNOSIS — M25512 Pain in left shoulder: Secondary | ICD-10-CM

## 2018-08-17 NOTE — Therapy (Signed)
[facility] [place] [place] [address] [place], [place], [address] Phone: [phone]   Fax:  [phone]  Physical Therapy Evaluation  Patient Details  Name: Lynn Kim MRN: [id number] Date of Birth: 1941-05-05 Referring Provider (PT): Tally Joe   Encounter Date: 08/17/2018  PT End of Session - 08/17/18 1038    Visit Number  1    Date for PT Re-Evaluation  10/17/18    PT Start Time  1012    PT Stop Time  1100    PT Time Calculation (min)  48 min    Activity Tolerance  Patient tolerated treatment well    Behavior During Therapy  [facility] for tasks assessed/performed       Past Medical History:  Diagnosis Date  . Anxiety    PAST HISTORY  . Arthritis    bilateral hands  . Depression    genetic gene  . Heart murmur    CHILDHOOD FUNCTIONAL HEART MURMUR  . Osteoporosis   . Vertigo     Past Surgical History:  Procedure Laterality Date  . APPENDECTOMY    . BACK SURGERY    . BREAST EXCISIONAL BIOPSY    . BUNIONECTOMY    . HYSTEROSCOPY W/D&C N/A 06/15/2014   Procedure: DILATATION AND CURETTAGE /HYSTEROSCOPY;  Surgeon: Sharon Seller, DO;  Location: [facility];  Service: Gynecology;  Laterality: N/A;  w/Polypectomy  . left breast cyst removal    . TONSILLECTOMY      There were no vitals filed for this visit.   Subjective Assessment - 08/17/18 1014    Subjective  Patient reports that she started having arm and neck pain over the past several months.  She felt like it was due to desk set up but reports that her doing a stretch seems to have mad things worse.  X-rays some DDD.    Pertinent History  Harrington rods from T2-L5    Limitations  House hold activities    Patient Stated Goals  have less pain, easier exercises    Currently in Pain?  Yes    Pain Score  2     Pain Location  Neck    Pain Orientation  Right;Left    Pain Descriptors / Indicators  Aching;Sore    Pain Type  Acute pain    Pain Radiating Towards  has  some pain in the bilateral upper arms, denies numbness or tingling    Pain Onset  More than a month ago    Pain Frequency  Constant    Aggravating Factors   worse at night, worse with stretches, pain an 8/10, also pulling incresaes the pain    Pain Relieving Factors  lying down sometimes helps, reports that when it really hurts she has a hard time getting the pain to decreased         [facility] PT Assessment - 08/17/18 0001      Assessment   Medical Diagnosis  nekc pain, Upper arm pain    Referring Provider (PT)  Tally Joe    Onset Date/Surgical Date  07/17/18    Hand Dominance  Right    Prior Therapy  in the past for chest and back pain      Precautions   Precaution Comments  has Harrington Rods      Balance Screen   Has the patient fallen in the past 6 months  No    Has the patient had a decrease in activity level because of  a fear of falling?   No    Is the patient reluctant to leave their home because of a fear of falling?   No      Home Environment   Additional Comments  has stairs, does a lot of yardwork      Prior Function   Level of Independence  Independent    Vocation  Retired    Leisure  does 3days a week of classes at [employer], does some exercises on other days on her own.      Posture/Postural Control   Posture Comments  fwd head, rounded shoulders      ROM / Strength   AROM / PROM / Strength  AROM;Strength      AROM   Overall AROM Comments  Cervical ROM is decreased 50% with c/o tightness.  Shoulder flexion 145 degrees, ER is 65 degrees, IR is 60 degrees, all arm motions cause some upper arm pain      Strength   Overall Strength Comments  UE strength was 4/5 with some pain in the upper arm      Flexibility   Soft Tissue Assessment /Muscle Length  --   has some UE tension     Palpation   Palpation comment  cervical and upper trap mms are tight, has tightness in the pecs and the bicep, tender in the upper arms                Objective  measurements completed on examination: See above findings.                PT Short Term Goals - 08/17/18 1047      PT SHORT TERM GOAL #1   Title  independent with initial HEP    Time  2    Period  Weeks    Status  New        PT Long Term Goals - 08/17/18 1047      PT LONG TERM GOAL #1   Title  decrease pain 50%    Time  8    Period  Weeks    Status  New      PT LONG TERM GOAL #2   Title  report able to reach overhead and not have pain    Time  8    Period  Weeks    Status  New      PT LONG TERM GOAL #3   Title  report be able to sleep throught the night     Time  8    Period  Weeks    Status  New      PT LONG TERM GOAL #4   Title  incresae cervical ROM 25%    Time  8    Period  Weeks    Status  New             Plan - 08/17/18 1039    Clinical Impression Statement  Patient reports that she has been having some neck and arm pain for over a month.  Reports that she is unsure of why she started having pain.  She had X-rays that show some DDD.  She is very tight in the upper trap and the neck.  Has neural tension in the UE's.  She reports that she sometimes, puts her head in her hands denmonstrating traction helps.    History and Personal Factors relevant to plan of care:  Harrington Rods    Clinical Presentation  Stable    Clinical Decision Making  Low    Rehab Potential  Good    PT Frequency  2x / week    PT Duration  8 weeks    PT Treatment/Interventions  ADLs/Self Care Home Management;Cryotherapy;Electrical Stimulation;Iontophoresis 4mg /ml Dexamethasone;Moist Heat;Traction;Ultrasound;Therapeutic exercise;Therapeutic activities;Patient/family education;Manual techniques;Dry needling    PT Next Visit Plan  may look at [facility] vs exercise    Consulted and Agree with Plan of Care  Patient       Patient will benefit from skilled therapeutic intervention in order to improve the following deficits and impairments:  Decreased range of motion, Impaired UE  functional use, Increased muscle spasms, Pain, Impaired flexibility, Improper body mechanics, Decreased strength, Postural dysfunction  Visit Diagnosis: Cervicalgia - Plan: PT plan of care cert/re-cert  Acute pain of right shoulder - Plan: PT plan of care cert/re-cert  Other muscle spasm - Plan: PT plan of care cert/re-cert  Acute pain of left shoulder - Plan: PT plan of care cert/re-cert     Problem List Patient Active Problem List   Diagnosis Date Noted  . Chest pain 11/29/2014    Jearld Lesch., PT 08/17/2018, 11:45 AM  [facility] [place] [address] [place], [place], [address] Phone: [phone]   Fax:  [phone]  Name: BELISA EICHHOLZ MRN: [id number] Date of Birth: 1941-04-12

## 2018-08-25 ENCOUNTER — Other Ambulatory Visit: Payer: Self-pay | Admitting: Obstetrics & Gynecology

## 2018-08-25 DIAGNOSIS — N644 Mastodynia: Secondary | ICD-10-CM

## 2018-08-31 ENCOUNTER — Ambulatory Visit: Payer: Medicare Other | Attending: Family Medicine | Admitting: Physical Therapy

## 2018-08-31 ENCOUNTER — Encounter: Payer: Self-pay | Admitting: Physical Therapy

## 2018-08-31 DIAGNOSIS — M25511 Pain in right shoulder: Secondary | ICD-10-CM | POA: Diagnosis present

## 2018-08-31 DIAGNOSIS — M542 Cervicalgia: Secondary | ICD-10-CM | POA: Insufficient documentation

## 2018-08-31 DIAGNOSIS — M62838 Other muscle spasm: Secondary | ICD-10-CM | POA: Diagnosis present

## 2018-08-31 DIAGNOSIS — M25512 Pain in left shoulder: Secondary | ICD-10-CM | POA: Insufficient documentation

## 2018-08-31 NOTE — Therapy (Signed)
[facility] [address] [place], [place], [address] Phone: [phone]   Fax:  [phone]  Physical Therapy Treatment  Patient Details  Name: Lynn Kim MRN: [id number] Date of Birth: 12-Apr-1941 Referring Provider (PT): Tally Joeavid Swayne   Encounter Date: 08/31/2018  PT End of Session - 08/31/18 1207    Visit Number  2    Date for PT Re-Evaluation  10/17/18    PT Start Time  1055    PT Stop Time  1138    PT Time Calculation (min)  43 min    Activity Tolerance  Patient tolerated treatment well    Behavior During Therapy  [facility] for tasks assessed/performed       Past Medical History:  Diagnosis Date  . Anxiety    PAST HISTORY  . Arthritis    bilateral hands  . Depression    genetic gene  . Heart murmur    CHILDHOOD FUNCTIONAL HEART MURMUR  . Osteoporosis   . Vertigo     Past Surgical History:  Procedure Laterality Date  . APPENDECTOMY    . BACK SURGERY    . BREAST EXCISIONAL BIOPSY    . BUNIONECTOMY    . HYSTEROSCOPY W/D&C N/A 06/15/2014   Procedure: DILATATION AND CURETTAGE /HYSTEROSCOPY;  Surgeon: Sharon SellerJennifer M Ozan, DO;  Location: [facility];  Service: Gynecology;  Laterality: N/A;  w/Polypectomy  . left breast cyst removal    . TONSILLECTOMY      There were no vitals filed for this visit.  Subjective Assessment - 08/31/18 1204    Subjective  Patient reports that she was a little sore.  Having some neck and arm pain, reports mms are tight    Currently in Pain?  Yes    Pain Score  3     Pain Location  Neck    Aggravating Factors   stress and activity                       [facility] Adult PT Treatment/Exercise - 08/31/18 0001      Manual Therapy   Manual Therapy  Soft tissue mobilization;Myofascial release;Manual Traction;Passive ROM;Neural Stretch    Soft tissue mobilization  to upper traps, cervical and rhomboid area, the upper arms and the pecs    Passive ROM  cervical ROM to end  ranges    Manual Traction  occipital release    Neural Stretch  UE stretch               PT Short Term Goals - 08/31/18 1208      PT SHORT TERM GOAL #1   Title  independent with initial HEP    Status  On-going        PT Long Term Goals - 08/17/18 1047      PT LONG TERM GOAL #1   Title  decrease pain 50%    Time  8    Period  Weeks    Status  New      PT LONG TERM GOAL #2   Title  report able to reach overhead and not have pain    Time  8    Period  Weeks    Status  New      PT LONG TERM GOAL #3   Title  report be able to sleep throught the night     Time  8    Period  Weeks    Status  New      PT LONG TERM GOAL #4   Title  incresae cervical ROM 25%    Time  8    Period  Weeks    Status  New            Plan - 08/31/18 1207    Clinical Impression Statement  Patient very tight in the upper traps and the cervical area, tight with left rotation.  She has tnederness in the biceps and the pectorals, UE neural tension is positive.    PT Next Visit Plan  see  how treatment did    Consulted and Agree with Plan of Care  Patient       Patient will benefit from skilled therapeutic intervention in order to improve the following deficits and impairments:  Decreased range of motion, Impaired UE functional use, Increased muscle spasms, Pain, Impaired flexibility, Improper body mechanics, Decreased strength, Postural dysfunction  Visit Diagnosis: Cervicalgia  Acute pain of right shoulder  Other muscle spasm  Acute pain of left shoulder     Problem List Patient Active Problem List   Diagnosis Date Noted  . Chest pain 11/29/2014    Jearld Lesch., PT 08/31/2018, 12:09 PM  [facility] [address] [place], [place], [address] Phone: [phone]   Fax:  [phone]  Name: Lynn Kim MRN: [id number] Date of Birth: Apr 09, 1941

## 2018-09-02 ENCOUNTER — Ambulatory Visit
Admission: RE | Admit: 2018-09-02 | Discharge: 2018-09-02 | Disposition: A | Discharge status: Home / Self Care | Payer: Medicare Other | Source: Ambulatory Visit | Attending: Obstetrics & Gynecology | Admitting: Obstetrics & Gynecology

## 2018-09-02 DIAGNOSIS — N644 Mastodynia: Secondary | ICD-10-CM

## 2018-09-07 ENCOUNTER — Ambulatory Visit: Payer: Medicare Other | Admitting: Physical Therapy

## 2018-09-07 ENCOUNTER — Encounter: Payer: Self-pay | Admitting: Physical Therapy

## 2018-09-07 DIAGNOSIS — M542 Cervicalgia: Secondary | ICD-10-CM

## 2018-09-07 DIAGNOSIS — M62838 Other muscle spasm: Secondary | ICD-10-CM

## 2018-09-07 DIAGNOSIS — M25512 Pain in left shoulder: Secondary | ICD-10-CM

## 2018-09-07 DIAGNOSIS — M25511 Pain in right shoulder: Secondary | ICD-10-CM

## 2018-09-07 NOTE — Therapy (Signed)
[facility] [address] [place], [place], [address] Phone: [phone]   Fax:  [phone]  Physical Therapy Treatment  Patient Details  Name: Lynn Kim MRN: [id number] Date of Birth: 05/30/1941 Referring Provider (PT): Tally Joeavid Swayne   Encounter Date: 09/07/2018  PT End of Session - 09/07/18 1003    Visit Number  3    Date for PT Re-Evaluation  10/17/18    PT Start Time  0924    PT Stop Time  1021    PT Time Calculation (min)  57 min    Activity Tolerance  Patient tolerated treatment well    Behavior During Therapy  [facility] for tasks assessed/performed       Past Medical History:  Diagnosis Date  . Anxiety    PAST HISTORY  . Arthritis    bilateral hands  . Depression    genetic gene  . Heart murmur    CHILDHOOD FUNCTIONAL HEART MURMUR  . Osteoporosis   . Vertigo     Past Surgical History:  Procedure Laterality Date  . APPENDECTOMY    . BACK SURGERY    . BREAST EXCISIONAL BIOPSY    . BUNIONECTOMY    . HYSTEROSCOPY W/D&C N/A 06/15/2014   Procedure: DILATATION AND CURETTAGE /HYSTEROSCOPY;  Surgeon: Sharon SellerJennifer M Ozan, DO;  Location: [facility];  Service: Gynecology;  Laterality: N/A;  w/Polypectomy  . left breast cyst removal    . TONSILLECTOMY      There were no vitals filed for this visit.  Subjective Assessment - 09/07/18 0927    Subjective  Patient reports that she thinks what we did last time helped, reports that she is now feeling sinus issues today and not feeling her best    Currently in Pain?  Yes    Pain Score  4     Pain Location  Neck    Aggravating Factors   yardwork                       [facility] Adult PT Treatment/Exercise - 09/07/18 0001      Exercises   Exercises  Neck      Neck Exercises: Theraband   Scapula Retraction  20 reps;Red    Shoulder Extension  20 reps;Red      Neck Exercises: Standing   Neck Retraction  20 reps    Other Standing Exercises  shrugs  with upper trap and levator stretches      Modalities   Modalities  Electrical Stimulation;Moist Heat      Moist Heat Therapy   Number Minutes Moist Heat  15 Minutes    Moist Heat Location  Cervical      Electrical Stimulation   Electrical Stimulation Location  C/T area    Electrical Stimulation Action  IFC    Electrical Stimulation Parameters  supine    Electrical Stimulation Goals  Pain      Manual Therapy   Manual Therapy  Soft tissue mobilization;Myofascial release;Manual Traction;Passive ROM;Neural Stretch    Soft tissue mobilization  to upper traps, cervical and rhomboid area, the upper arms and the pecs    Passive ROM  cervical ROM to end ranges    Manual Traction  occipital release    Neural Stretch  UE stretch               PT Short Term Goals - 09/07/18 1005      PT SHORT TERM GOAL #1  Title  independent with initial HEP    Status  Achieved        PT Long Term Goals - 08/17/18 1047      PT LONG TERM GOAL #1   Title  decrease pain 50%    Time  8    Period  Weeks    Status  New      PT LONG TERM GOAL #2   Title  report able to reach overhead and not have pain    Time  8    Period  Weeks    Status  New      PT LONG TERM GOAL #3   Title  report be able to sleep throught the night     Time  8    Period  Weeks    Status  New      PT LONG TERM GOAL #4   Title  incresae cervical ROM 25%    Time  8    Period  Weeks    Status  New            Plan - 09/07/18 1004    Clinical Impression Statement  Patient overall not feeling well due to sinus and chest cold issues.  Reports that she feels like what we did has helped her pain and mobility, she does report being sick is not good for the ROM that she gained as she is staying in bed and getting stiff again    PT Next Visit Plan  slowly add some exercises    Consulted and Agree with Plan of Care  Patient       Patient will benefit from skilled therapeutic intervention in order to improve the  following deficits and impairments:  Decreased range of motion, Impaired UE functional use, Increased muscle spasms, Pain, Impaired flexibility, Improper body mechanics, Decreased strength, Postural dysfunction  Visit Diagnosis: Cervicalgia  Acute pain of right shoulder  Other muscle spasm  Acute pain of left shoulder     Problem List Patient Active Problem List   Diagnosis Date Noted  . Chest pain 11/29/2014    Jearld Lesch., PT 09/07/2018, 10:06 AM  [facility] [place] [facility] [address] [place], [place], [address] Phone: [phone]   Fax:  [phone]  Name: Lynn Kim MRN: [id number] Date of Birth: [birth year]

## 2018-09-15 ENCOUNTER — Encounter: Payer: Self-pay | Admitting: Physical Therapy

## 2018-09-15 ENCOUNTER — Ambulatory Visit: Payer: Medicare Other | Admitting: Physical Therapy

## 2018-09-15 DIAGNOSIS — M25512 Pain in left shoulder: Secondary | ICD-10-CM

## 2018-09-15 DIAGNOSIS — M542 Cervicalgia: Secondary | ICD-10-CM | POA: Diagnosis not present

## 2018-09-15 DIAGNOSIS — M62838 Other muscle spasm: Secondary | ICD-10-CM

## 2018-09-15 DIAGNOSIS — M25511 Pain in right shoulder: Secondary | ICD-10-CM

## 2018-09-15 NOTE — Therapy (Signed)
[place], [place], [address] Phone: [phone]   Fax:  [phone]  Physical Therapy Treatment  Patient Details  Name: Lynn Kim MRN: [id number] Date of Birth: 1941-08-04 Referring Provider (PT): Antony Contras   Encounter Date: 09/15/2018  PT End of Session - 09/15/18 1655    Visit Number  4    Date for PT Re-Evaluation  10/17/18    PT Start Time  1610    PT Stop Time  1700    PT Time Calculation (min)  50 min    Activity Tolerance  Patient tolerated treatment well    Behavior During Therapy  [facility] for tasks assessed/performed       Past Medical History:  Diagnosis Date  . Anxiety    PAST HISTORY  . Arthritis    bilateral hands  . Depression    genetic gene  . Heart murmur    CHILDHOOD FUNCTIONAL HEART MURMUR  . Osteoporosis   . Vertigo     Past Surgical History:  Procedure Laterality Date  . APPENDECTOMY    . BACK SURGERY    . BREAST EXCISIONAL BIOPSY    . BUNIONECTOMY    . HYSTEROSCOPY W/D&C N/A 06/15/2014   Procedure: DILATATION AND CURETTAGE /HYSTEROSCOPY;  Surgeon: Annalee Genta, DO;  Location: [place] [facility];  Service: Gynecology;  Laterality: N/A;  w/Polypectomy  . left breast cyst removal    . TONSILLECTOMY      There were no vitals filed for this visit.  Subjective Assessment - 09/15/18 1653    Subjective  Patient reports that she is overall feeling better.  Reports that she returned to some exercises    Currently in Pain?  Yes    Pain Score  3     Pain Location  Neck    Aggravating Factors   yardwork    Pain Relieving Factors  the treatment helps                       [facility] Adult PT Treatment/Exercise - 09/15/18 0001      Neck Exercises: Theraband   Scapula Retraction  20 reps;Red    Shoulder Extension  20 reps;Red      Neck Exercises: Standing   Other Standing Exercises  shrugs with upper trap and levator stretches      Modalities    Modalities  Electrical Stimulation;Moist Heat      Moist Heat Therapy   Number Minutes Moist Heat  15 Minutes    Moist Heat Location  Cervical      Electrical Stimulation   Electrical Stimulation Location  C/T area    Electrical Stimulation Action  IFC    Electrical Stimulation Parameters  supine    Electrical Stimulation Goals  Pain      Manual Therapy   Manual Therapy  Soft tissue mobilization;Myofascial release;Manual Traction;Passive ROM;Neural Stretch    Soft tissue mobilization  to upper traps, cervical and rhomboid area, the upper arms and the pecs    Passive ROM  cervical ROM to end ranges    Manual Traction  occipital release    Neural Stretch  UE stretch               PT Short Term Goals - 09/07/18 1005      PT SHORT TERM GOAL #1   Title  independent with initial HEP    Status  Achieved  PT Long Term Goals - 09/15/18 1657      PT LONG TERM GOAL #1   Title  decrease pain 50%    Status  On-going      PT LONG TERM GOAL #2   Title  report able to reach overhead and not have pain    Status  On-going      PT LONG TERM GOAL #3   Title  report be able to sleep throught the night     Status  Partially Met      PT LONG TERM GOAL #4   Title  incresae cervical ROM 25%    Status  On-going            Plan - 09/15/18 1655    Clinical Impression Statement  Patient reports that she is feeling better, returned sot the gym reports some soreness and fatigue but better than before we started PT, had some questions about exercises    PT Next Visit Plan  slowly add some exercises    Consulted and Agree with Plan of Care  Patient       Patient will benefit from skilled therapeutic intervention in order to improve the following deficits and impairments:  Decreased range of motion, Impaired UE functional use, Increased muscle spasms, Pain, Impaired flexibility, Improper body mechanics, Decreased strength, Postural dysfunction  Visit  Diagnosis: Cervicalgia  Acute pain of right shoulder  Other muscle spasm  Acute pain of left shoulder     Problem List Patient Active Problem List   Diagnosis Date Noted  . Chest pain 11/29/2014    Sumner Boast., PT 09/15/2018, 4:58 PM  [place] [address] [place], [place], [address] Phone: [phone]   Fax:  [phone]  Name: Lynn Kim MRN: [id number] Date of Birth: [birth year]

## 2018-09-28 ENCOUNTER — Ambulatory Visit: Payer: Medicare Other | Admitting: Physical Therapy

## 2019-01-03 ENCOUNTER — Other Ambulatory Visit: Payer: Self-pay | Admitting: Family Medicine

## 2019-01-03 DIAGNOSIS — M810 Age-related osteoporosis without current pathological fracture: Secondary | ICD-10-CM

## 2019-01-25 IMAGING — CR DG RIBS W/ CHEST 3+V*L*
4 series · 4 of 4 positions shown · non-contrast
Comparison: Chest dated 11/29/2014.

CLINICAL DATA: Left lower anterior rib pain after feeling a pop
while bending down.

EXAM:
LEFT RIBS AND CHEST - 3+ VIEW

[w chest pa]
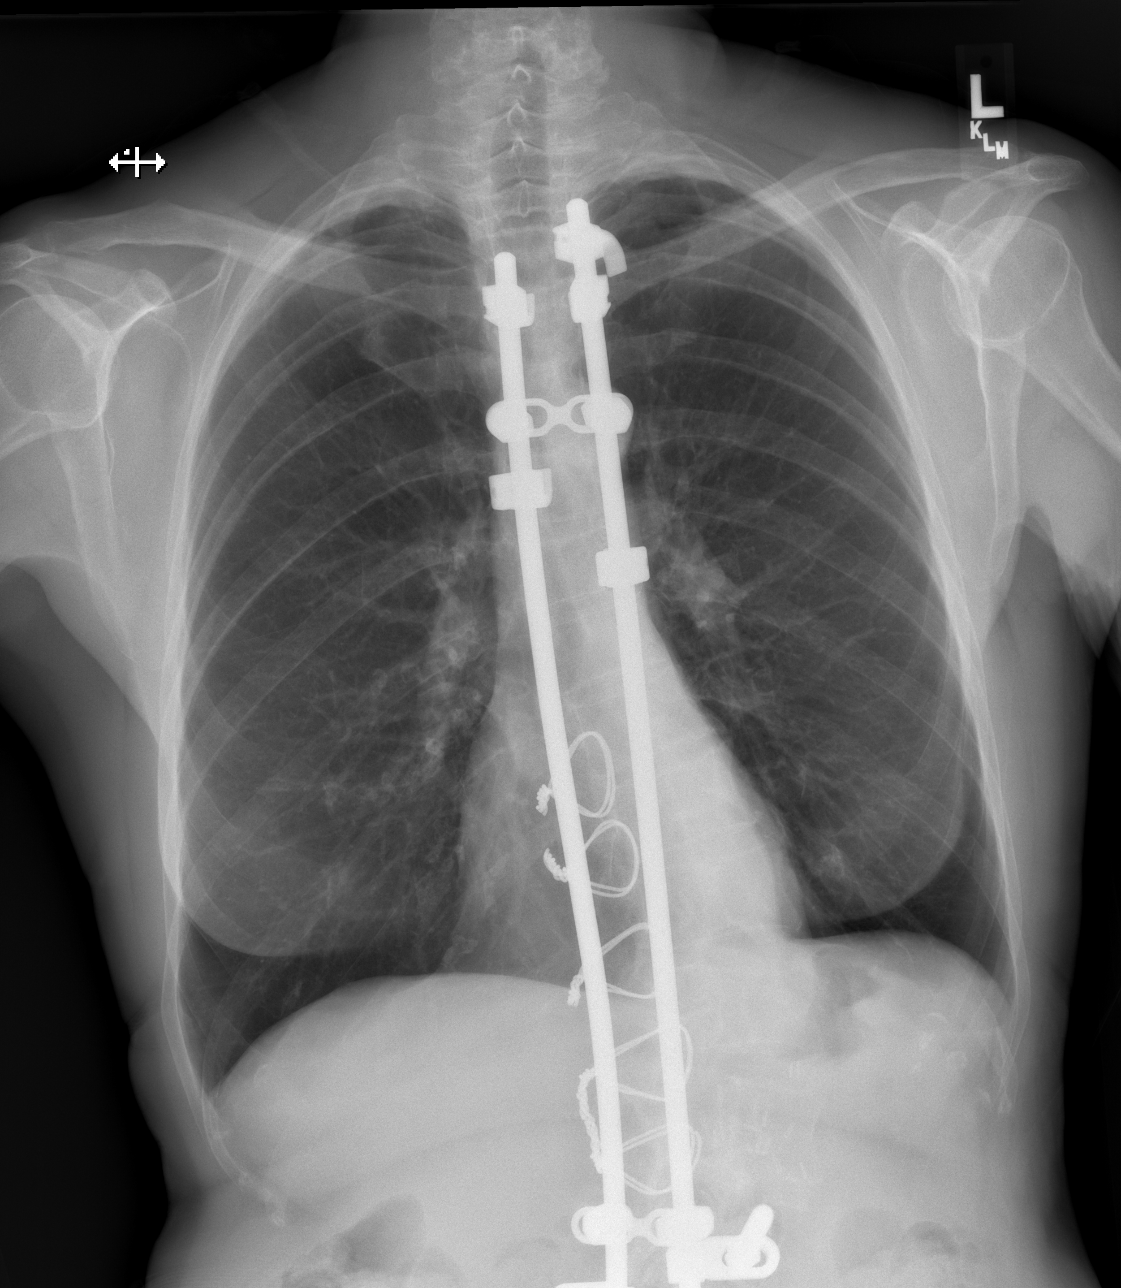

[w ribs ap lower left]
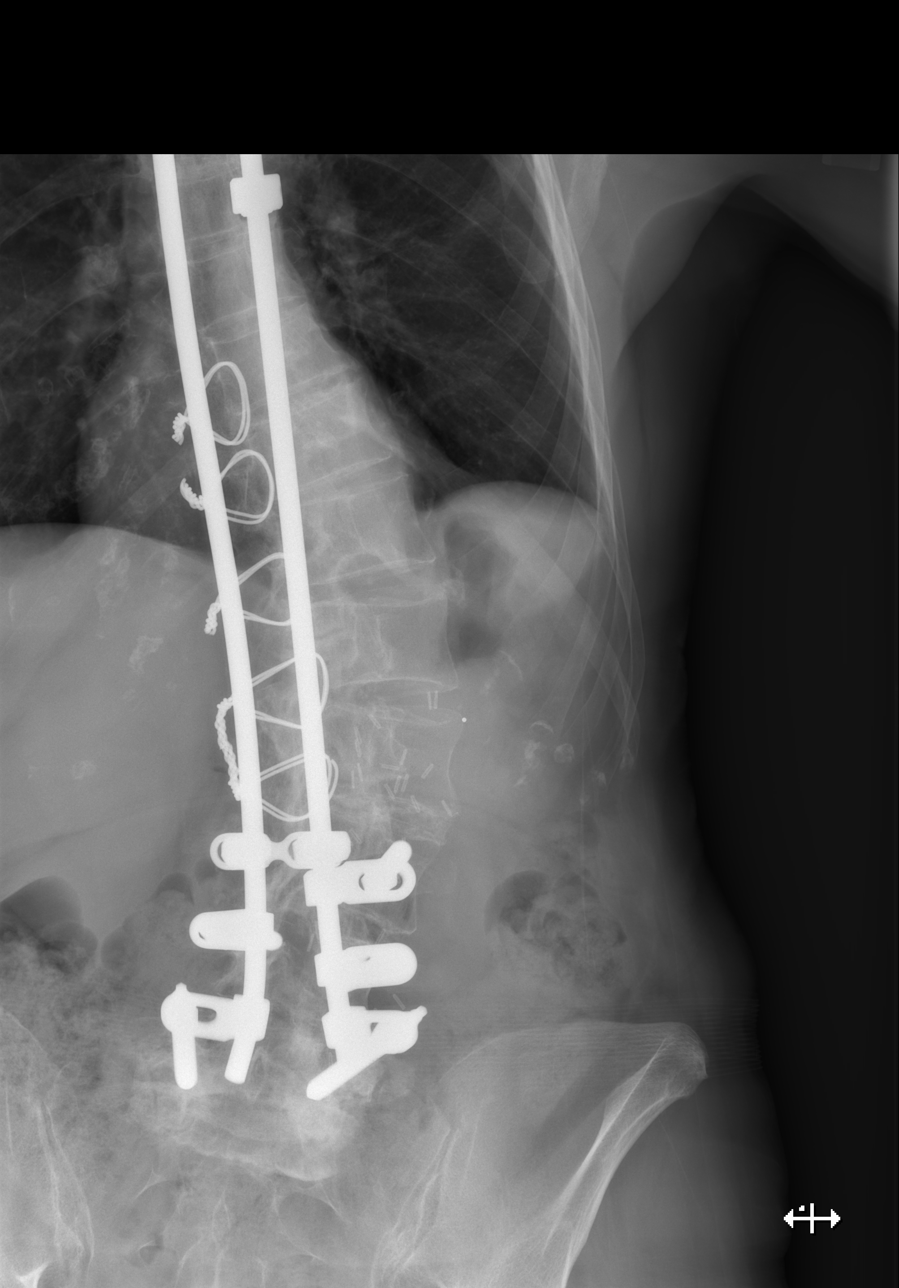

[w ribs obl left (1 of 2)]
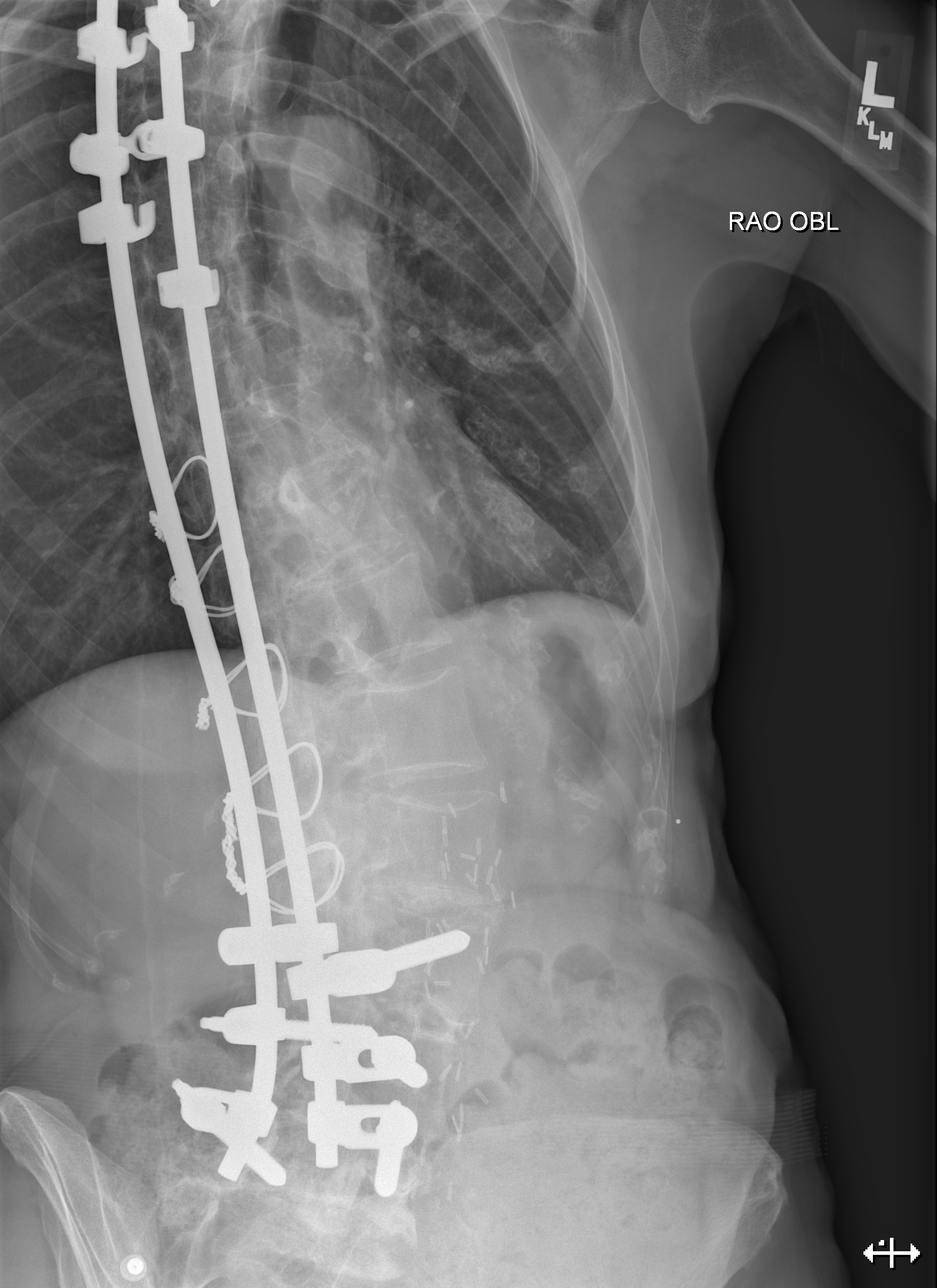

[w ribs obl left (2 of 2)]
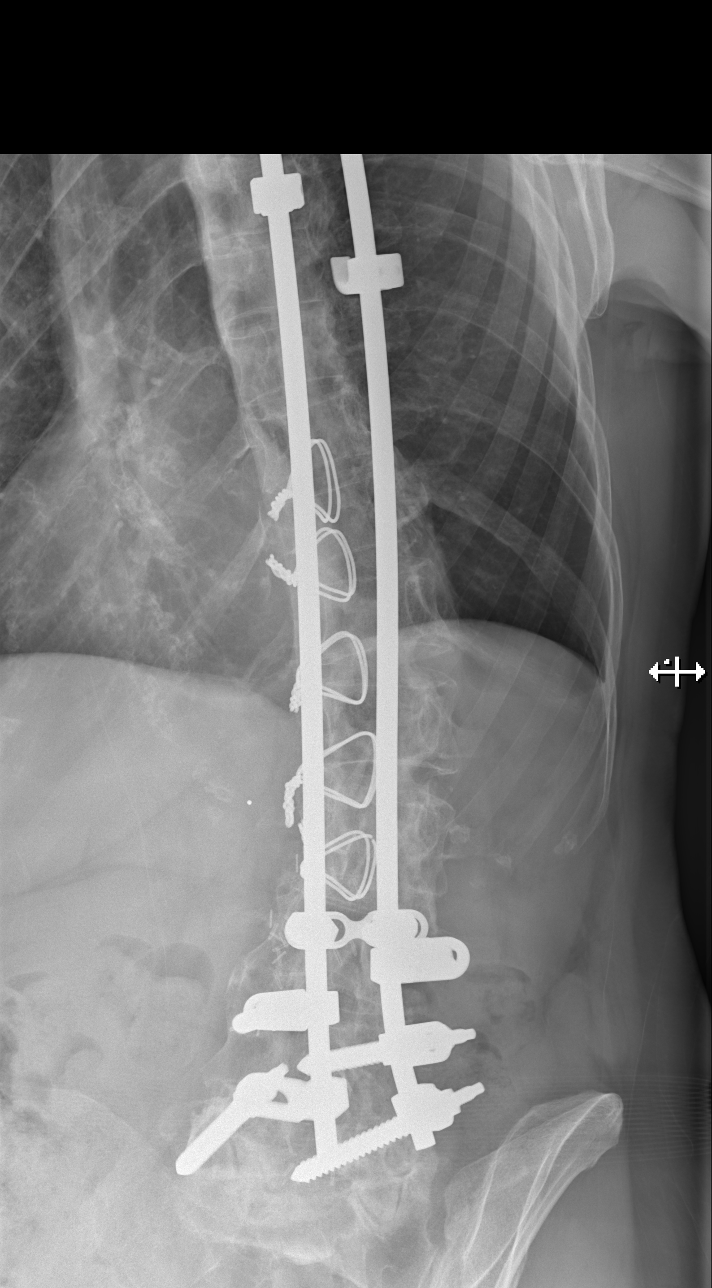

[4 of 4 positions shown; findings below may reference images not displayed]

FINDINGS: Normal sized heart. Clear lungs. The lungs are hyperexpanded with
minimal biapical pleural and parenchymal scarring. Stable thoracic
and lumbar spine fixation hardware. Multiple paravertebral abdominal
surgical clips. No rib fracture or pneumothorax seen.
IMPRESSION: 1. No acute abnormality.
2. Mild changes of COPD.

## 2019-04-07 ENCOUNTER — Other Ambulatory Visit: Payer: Self-pay

## 2019-04-07 ENCOUNTER — Ambulatory Visit
Admission: RE | Admit: 2019-04-07 | Discharge: 2019-04-07 | Disposition: A | Discharge status: Home / Self Care | Payer: Medicare Other | Source: Ambulatory Visit | Attending: Family Medicine | Admitting: Family Medicine

## 2019-04-07 DIAGNOSIS — M810 Age-related osteoporosis without current pathological fracture: Secondary | ICD-10-CM

## 2019-08-02 ENCOUNTER — Other Ambulatory Visit: Payer: Self-pay | Admitting: Gastroenterology

## 2019-08-02 DIAGNOSIS — R110 Nausea: Secondary | ICD-10-CM

## 2019-08-02 DIAGNOSIS — R140 Abdominal distension (gaseous): Secondary | ICD-10-CM

## 2019-08-09 ENCOUNTER — Ambulatory Visit
Admission: RE | Admit: 2019-08-09 | Discharge: 2019-08-09 | Disposition: A | Discharge status: Home / Self Care | Payer: Medicare Other | Source: Ambulatory Visit | Attending: Gastroenterology | Admitting: Gastroenterology

## 2019-08-09 DIAGNOSIS — R140 Abdominal distension (gaseous): Secondary | ICD-10-CM

## 2019-08-09 DIAGNOSIS — R110 Nausea: Secondary | ICD-10-CM

## 2019-10-19 ENCOUNTER — Ambulatory Visit: Payer: Medicare Other | Attending: Internal Medicine

## 2019-10-19 DIAGNOSIS — Z23 Encounter for immunization: Secondary | ICD-10-CM | POA: Insufficient documentation

## 2019-10-19 NOTE — Progress Notes (Addendum)
   Covid-19 Vaccination Clinic  Name:  Lynn Kim    MRN: [id number] DOB: [birth year]  10/19/2019  [name] was observed post Covid-19 immunization for 30 mins  without incidence. She was provided with Vaccine Information Sheet and instruction to access the V-Safe system.   [name] was instructed to call 911 with any severe reactions post vaccine: Marland Kitchen Difficulty breathing  . Swelling of your face and throat  . A fast heartbeat  . A bad rash all over your body  . Dizziness and weakness    Immunizations Administered    Name Date Dose VIS Date Route   Pfizer COVID-19 Vaccine 10/19/2019 11:32 AM 0.3 mL 09/09/2019 Intramuscular   Manufacturer: [employer], [employer]   Lot: [id number]   NDC: [address]-3

## 2019-11-09 ENCOUNTER — Ambulatory Visit: Payer: Medicare Other | Attending: Internal Medicine

## 2019-11-09 DIAGNOSIS — Z23 Encounter for immunization: Secondary | ICD-10-CM

## 2019-11-09 NOTE — Progress Notes (Signed)
   Covid-19 Vaccination Clinic  Name:  RENNEE COYNE    MRN: [id number] DOB: 12/16/1940  11/09/2019  [name] was observed post Covid-19 immunization for 30 minutes based on pre-vaccination screening without incidence. She was provided with Vaccine Information Sheet and instruction to access the V-Safe system.   Ms. Salatino was instructed to call 911 with any severe reactions post vaccine: Marland Kitchen Difficulty breathing  . Swelling of your face and throat  . A fast heartbeat  . A bad rash all over your body  . Dizziness and weakness    Immunizations Administered    Name Date Dose VIS Date Route   Pfizer COVID-19 Vaccine 11/09/2019  4:13 PM 0.3 mL 09/09/2019 Intramuscular   Manufacturer: [employer], [employer]   Lot: [id number]   NDC: [address]-4

## 2020-02-21 IMAGING — MG DIGITAL DIAGNOSTIC BILATERAL MAMMOGRAM WITH TOMO AND CAD
7 series · 8 of 23 positions shown · non-contrast
Comparison: Previous exam(s).

CLINICAL DATA: Pain and lumpiness in the medial right breast. The
patient had a sternalis muscle diagnosed in this region in 7880.

EXAM:
DIGITAL DIAGNOSTIC BILATERAL MAMMOGRAM WITH CAD AND TOMO
ULTRASOUND RIGHT BREAST

[L MLO synth-2D]
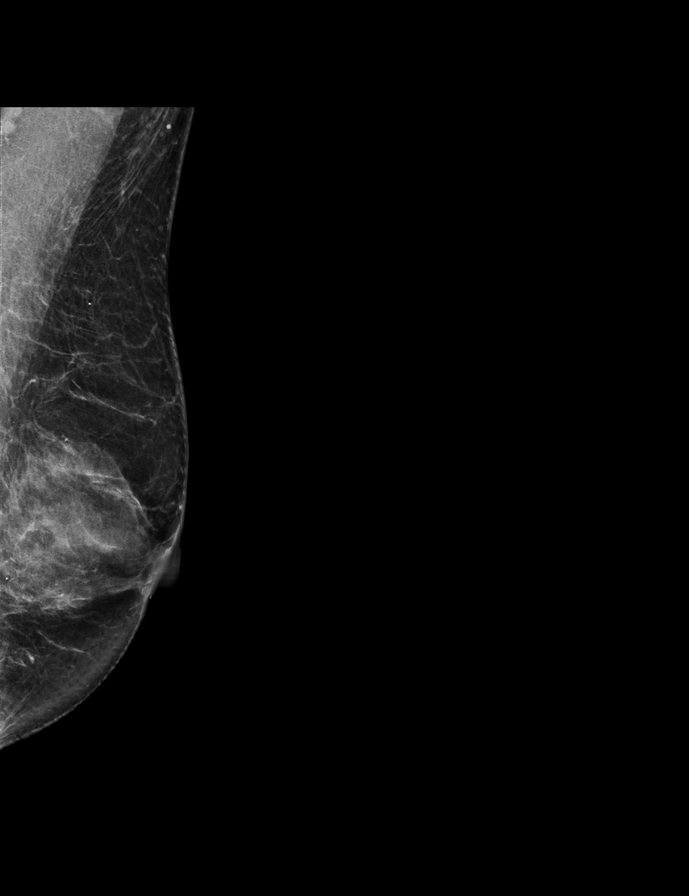

[R CC synth-2D]
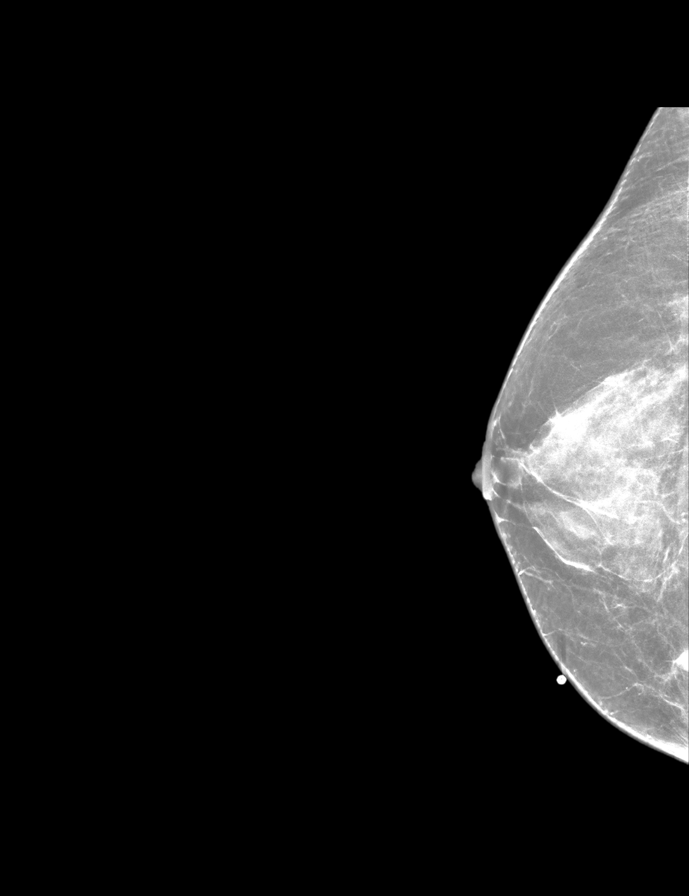

[R TAN synth-2D]
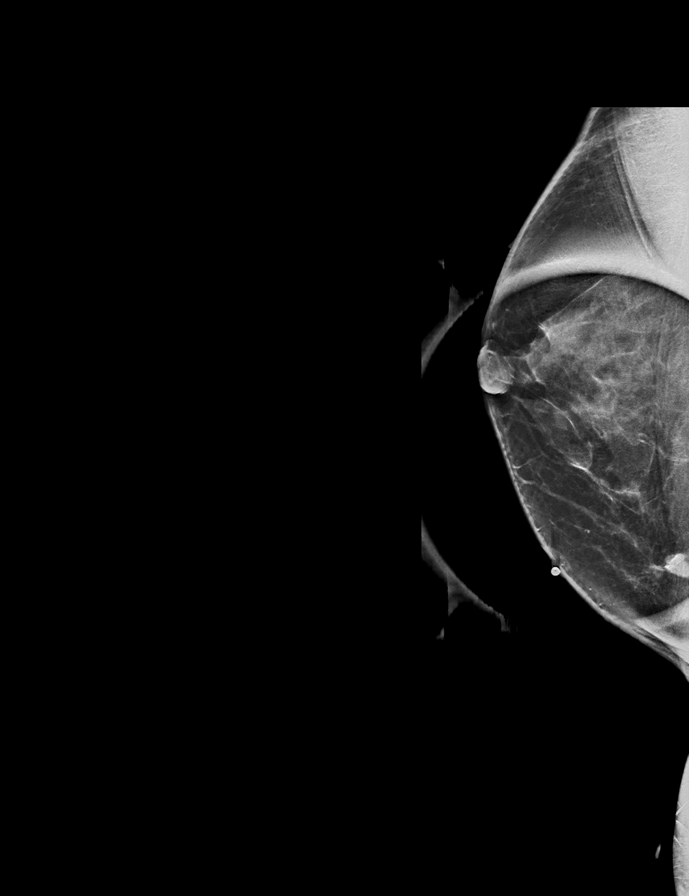

[R MLO tomo · 2 of 61 frames shown]
[frame 20/61]
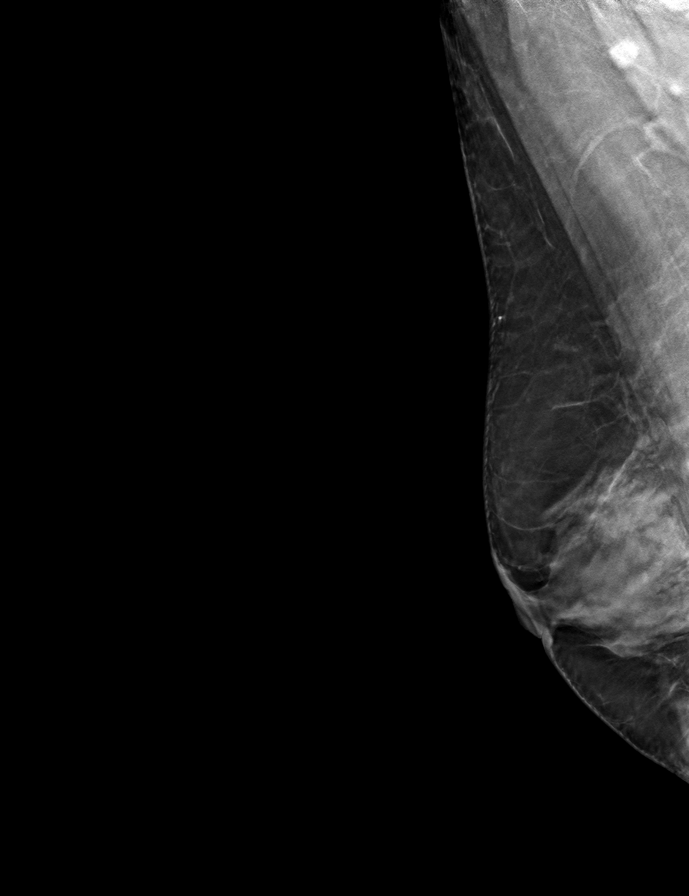
[frame 31/61]
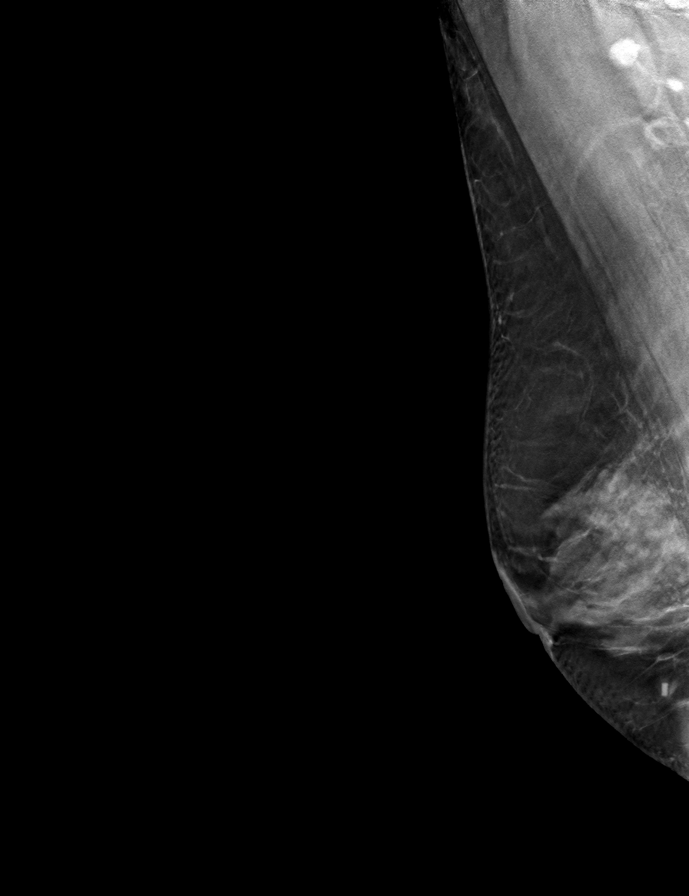

[L MLO tomo · tomo slice 30/59.0]
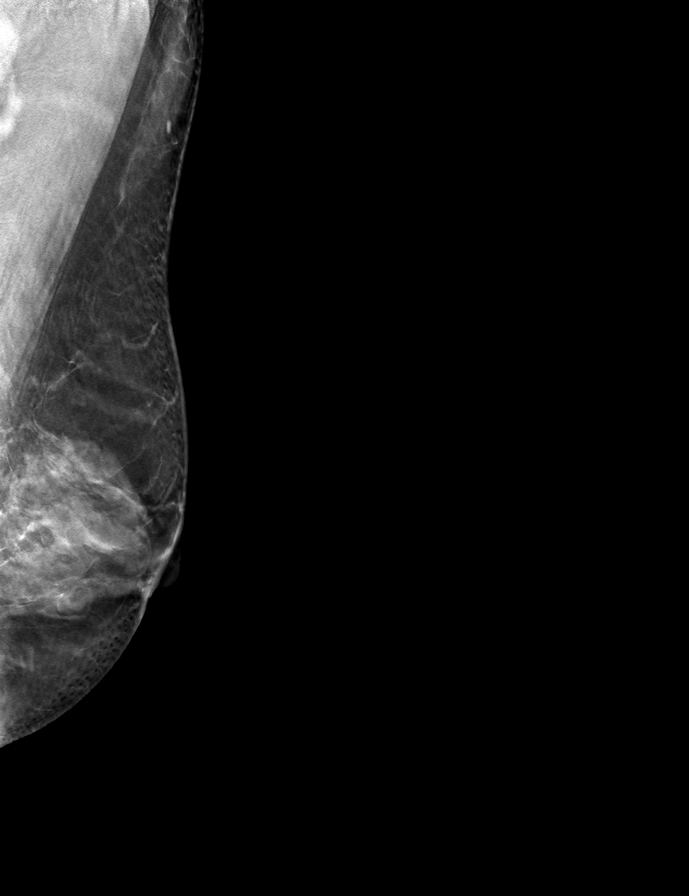

[R TAN tomo · tomo slice 29/58.0]
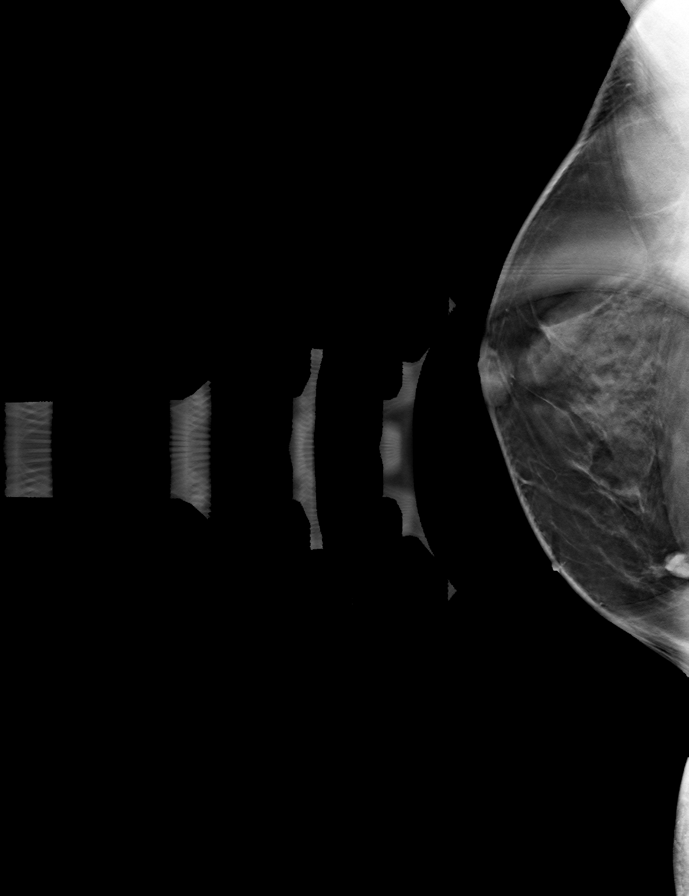

[R CC tomo · tomo slice 27/52.0]
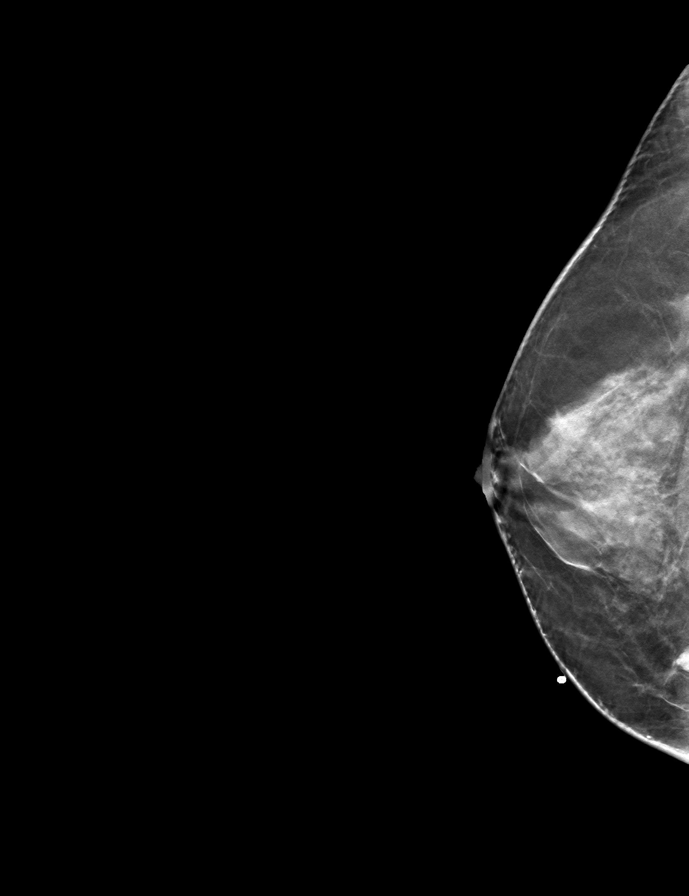

[8 of 23 positions shown; findings below may reference images not displayed]

ACR Breast Density Category c: The breast tissue is heterogeneously
dense, which may obscure small masses.
FINDINGS: The sternalis muscle is unchanged since at least 3017 and has been
described since 7880. No other mammographic findings.

Mammographic images were processed with CAD.

On physical exam, no suspicious lumps identified. The patient cannot
feel a lump today.

Targeted ultrasound is performed, showing no sonographic
abnormalities.
IMPRESSION: No mammographic or sonographic correlate for the patient's lump
which has resolved. No evidence of malignancy. Medial sternalis
muscle.

RECOMMENDATION:
Treatment of the patient's symptoms should be based on clinical and
physical exam given lack of imaging findings. Recommend annual
screening mammography.

I have discussed the findings and recommendations with the patient.
Results were also provided in writing at the conclusion of the
visit. If applicable, a reminder letter will be sent to the patient
regarding the next appointment.

BI-RADS CATEGORY  2: Benign.

## 2020-04-04 ENCOUNTER — Other Ambulatory Visit: Payer: Self-pay | Admitting: Family Medicine

## 2020-04-04 DIAGNOSIS — Z1231 Encounter for screening mammogram for malignant neoplasm of breast: Secondary | ICD-10-CM

## 2020-04-13 ENCOUNTER — Ambulatory Visit
Admission: RE | Admit: 2020-04-13 | Discharge: 2020-04-13 | Disposition: A | Discharge status: Home / Self Care | Payer: Medicare Other | Source: Ambulatory Visit | Attending: Family Medicine | Admitting: Family Medicine

## 2020-04-13 ENCOUNTER — Other Ambulatory Visit: Payer: Self-pay

## 2020-04-13 DIAGNOSIS — Z1231 Encounter for screening mammogram for malignant neoplasm of breast: Secondary | ICD-10-CM

## 2020-09-12 ENCOUNTER — Encounter (HOSPITAL_BASED_OUTPATIENT_CLINIC_OR_DEPARTMENT_OTHER): Payer: Self-pay | Admitting: Surgery

## 2020-09-12 ENCOUNTER — Other Ambulatory Visit: Payer: Self-pay

## 2020-09-13 ENCOUNTER — Ambulatory Visit: Payer: Self-pay | Admitting: Surgery

## 2020-09-15 ENCOUNTER — Inpatient Hospital Stay (HOSPITAL_COMMUNITY): Admission: RE | Admit: 2020-09-15 | Payer: Medicare Other | Source: Ambulatory Visit

## 2020-09-17 ENCOUNTER — Other Ambulatory Visit (HOSPITAL_COMMUNITY)
Admission: RE | Admit: 2020-09-17 | Discharge: 2020-09-17 | Disposition: A | Discharge status: Home / Self Care | Payer: Medicare Other | Source: Ambulatory Visit | Attending: Surgery | Admitting: Surgery

## 2020-09-17 DIAGNOSIS — Z20822 Contact with and (suspected) exposure to covid-19: Secondary | ICD-10-CM | POA: Diagnosis not present

## 2020-09-17 DIAGNOSIS — Z01812 Encounter for preprocedural laboratory examination: Secondary | ICD-10-CM | POA: Diagnosis present

## 2020-09-17 LAB — SARS CORONAVIRUS 2 (TAT 6-24 HRS): SARS Coronavirus 2: NEGATIVE

## 2020-09-17 NOTE — Progress Notes (Signed)
Surgical soap given with instructions, pt verbalized understanding.  

## 2020-09-19 ENCOUNTER — Ambulatory Visit (HOSPITAL_COMMUNITY)
Admission: RE | Admit: 2020-09-19 | Discharge: 2020-09-19 | Disposition: A | Discharge status: Home / Self Care | Payer: Medicare Other | Attending: Surgery | Admitting: Surgery

## 2020-09-19 ENCOUNTER — Other Ambulatory Visit: Payer: Self-pay

## 2020-09-19 ENCOUNTER — Ambulatory Visit (HOSPITAL_BASED_OUTPATIENT_CLINIC_OR_DEPARTMENT_OTHER): Payer: Medicare Other | Admitting: Certified Registered Nurse Anesthetist

## 2020-09-19 ENCOUNTER — Encounter (HOSPITAL_BASED_OUTPATIENT_CLINIC_OR_DEPARTMENT_OTHER)
Admission: RE | Disposition: A | Discharge status: Home / Self Care | Payer: Self-pay | Source: Home / Self Care | Attending: Surgery

## 2020-09-19 ENCOUNTER — Encounter (HOSPITAL_BASED_OUTPATIENT_CLINIC_OR_DEPARTMENT_OTHER): Payer: Self-pay | Admitting: Surgery

## 2020-09-19 DIAGNOSIS — K429 Umbilical hernia without obstruction or gangrene: Secondary | ICD-10-CM | POA: Insufficient documentation

## 2020-09-19 DIAGNOSIS — Z882 Allergy status to sulfonamides status: Secondary | ICD-10-CM | POA: Insufficient documentation

## 2020-09-19 DIAGNOSIS — Z880 Allergy status to penicillin: Secondary | ICD-10-CM | POA: Insufficient documentation

## 2020-09-19 DIAGNOSIS — Z91018 Allergy to other foods: Secondary | ICD-10-CM | POA: Diagnosis not present

## 2020-09-19 DIAGNOSIS — Z881 Allergy status to other antibiotic agents status: Secondary | ICD-10-CM | POA: Diagnosis not present

## 2020-09-19 DIAGNOSIS — Z79899 Other long term (current) drug therapy: Secondary | ICD-10-CM | POA: Diagnosis not present

## 2020-09-19 DIAGNOSIS — Z825 Family history of asthma and other chronic lower respiratory diseases: Secondary | ICD-10-CM | POA: Diagnosis not present

## 2020-09-19 DIAGNOSIS — Z91040 Latex allergy status: Secondary | ICD-10-CM | POA: Diagnosis not present

## 2020-09-19 DIAGNOSIS — Z888 Allergy status to other drugs, medicaments and biological substances status: Secondary | ICD-10-CM | POA: Insufficient documentation

## 2020-09-19 DIAGNOSIS — Z91012 Allergy to eggs: Secondary | ICD-10-CM | POA: Diagnosis not present

## 2020-09-19 HISTORY — DX: Umbilical hernia without obstruction or gangrene: K42.9

## 2020-09-19 HISTORY — PX: UMBILICAL HERNIA REPAIR: SHX196

## 2020-09-19 SURGERY — REPAIR, HERNIA, UMBILICAL, ADULT
Anesthesia: General | Site: Abdomen

## 2020-09-19 MED ORDER — LACTATED RINGERS IV SOLN: INTRAVENOUS | Status: DC

## 2020-09-19 MED ORDER — PROPOFOL 500 MG/50ML IV EMUL
INTRAVENOUS | Status: AC
  Filled 2020-09-19: qty 50

## 2020-09-19 MED ORDER — PHENYLEPHRINE 40 MCG/ML (10ML) SYRINGE FOR IV PUSH (FOR BLOOD PRESSURE SUPPORT)
PREFILLED_SYRINGE | INTRAVENOUS | Status: AC
  Filled 2020-09-19: qty 10

## 2020-09-19 MED ORDER — MIDAZOLAM HCL 2 MG/2ML IJ SOLN
INTRAMUSCULAR | Status: AC
  Filled 2020-09-19: qty 2

## 2020-09-19 MED ORDER — GABAPENTIN 300 MG PO CAPS: 300.0000 mg | ORAL_CAPSULE | ORAL | Status: DC

## 2020-09-19 MED ORDER — FENTANYL CITRATE (PF) 100 MCG/2ML IJ SOLN
INTRAMUSCULAR | Status: DC | PRN
  Administered 2020-09-19: 50 ug via INTRAVENOUS

## 2020-09-19 MED ORDER — BUPIVACAINE HCL (PF) 0.25 % IJ SOLN
INTRAMUSCULAR | Status: AC
  Filled 2020-09-19: qty 90

## 2020-09-19 MED ORDER — LIDOCAINE 2% (20 MG/ML) 5 ML SYRINGE
INTRAMUSCULAR | Status: AC
  Filled 2020-09-19: qty 5

## 2020-09-19 MED ORDER — DEXAMETHASONE SODIUM PHOSPHATE 10 MG/ML IJ SOLN
INTRAMUSCULAR | Status: AC
  Filled 2020-09-19: qty 1

## 2020-09-19 MED ORDER — SUCCINYLCHOLINE CHLORIDE 200 MG/10ML IV SOSY
PREFILLED_SYRINGE | INTRAVENOUS | Status: AC
  Filled 2020-09-19: qty 10

## 2020-09-19 MED ORDER — ROCURONIUM BROMIDE 100 MG/10ML IV SOLN
INTRAVENOUS | Status: DC | PRN
  Administered 2020-09-19: 30 mg via INTRAVENOUS

## 2020-09-19 MED ORDER — ONDANSETRON HCL 4 MG/2ML IJ SOLN
INTRAMUSCULAR | Status: AC
  Filled 2020-09-19: qty 2

## 2020-09-19 MED ORDER — FENTANYL CITRATE (PF) 100 MCG/2ML IJ SOLN
25.0000 ug | INTRAMUSCULAR | Status: DC | PRN
  Administered 2020-09-19: 25 ug via INTRAVENOUS

## 2020-09-19 MED ORDER — ACETAMINOPHEN 500 MG PO TABS
1000.0000 mg | ORAL_TABLET | ORAL | Status: AC
  Administered 2020-09-19: 07:00:00 1000 mg via ORAL

## 2020-09-19 MED ORDER — SUGAMMADEX SODIUM 200 MG/2ML IV SOLN
INTRAVENOUS | Status: DC | PRN
  Administered 2020-09-19: 200 mg via INTRAVENOUS

## 2020-09-19 MED ORDER — SUCCINYLCHOLINE CHLORIDE 20 MG/ML IJ SOLN
INTRAMUSCULAR | Status: DC | PRN
  Administered 2020-09-19: 140 mg via INTRAVENOUS

## 2020-09-19 MED ORDER — FENTANYL CITRATE (PF) 100 MCG/2ML IJ SOLN
INTRAMUSCULAR | Status: AC
  Filled 2020-09-19: qty 2

## 2020-09-19 MED ORDER — OXYCODONE HCL 5 MG PO TABS
5.0000 mg | ORAL_TABLET | Freq: Once | ORAL | Status: AC
Start: 2020-09-19 — End: 2020-09-19
  Administered 2020-09-19: 11:00:00 5 mg via ORAL

## 2020-09-19 MED ORDER — ACETAMINOPHEN 500 MG PO TABS
ORAL_TABLET | ORAL | Status: AC
  Filled 2020-09-19: qty 2

## 2020-09-19 MED ORDER — ONDANSETRON HCL 4 MG/2ML IJ SOLN
INTRAMUSCULAR | Status: DC | PRN
  Administered 2020-09-19: 4 mg via INTRAVENOUS

## 2020-09-19 MED ORDER — PROPOFOL 10 MG/ML IV BOLUS
INTRAVENOUS | Status: DC | PRN
  Administered 2020-09-19: 150 mg via INTRAVENOUS

## 2020-09-19 MED ORDER — LIDOCAINE HCL (CARDIAC) PF 100 MG/5ML IV SOSY
PREFILLED_SYRINGE | INTRAVENOUS | Status: DC | PRN
  Administered 2020-09-19: 60 mg via INTRAVENOUS

## 2020-09-19 MED ORDER — CEFAZOLIN SODIUM-DEXTROSE 2-4 GM/100ML-% IV SOLN
2.0000 g | INTRAVENOUS | Status: AC
  Administered 2020-09-19: 08:00:00 2 g via INTRAVENOUS

## 2020-09-19 MED ORDER — HYDROCODONE-ACETAMINOPHEN 5-325 MG PO TABS: 1.0000 | ORAL_TABLET | ORAL | 0 refills | Status: DC | PRN

## 2020-09-19 MED ORDER — BUPIVACAINE HCL (PF) 0.25 % IJ SOLN
INTRAMUSCULAR | Status: DC | PRN
  Administered 2020-09-19: 20 mL

## 2020-09-19 MED ORDER — EPHEDRINE SULFATE 50 MG/ML IJ SOLN
INTRAMUSCULAR | Status: DC | PRN
  Administered 2020-09-19: 10 mg via INTRAVENOUS

## 2020-09-19 MED ORDER — OXYCODONE HCL 5 MG PO TABS
ORAL_TABLET | ORAL | Status: AC
  Filled 2020-09-19: qty 1

## 2020-09-19 MED ORDER — DEXAMETHASONE SODIUM PHOSPHATE 4 MG/ML IJ SOLN
INTRAMUSCULAR | Status: DC | PRN
  Administered 2020-09-19: 10 mg via INTRAVENOUS

## 2020-09-19 MED ORDER — EPHEDRINE 5 MG/ML INJ
INTRAVENOUS | Status: AC
  Filled 2020-09-19: qty 10

## 2020-09-19 MED ORDER — ROCURONIUM BROMIDE 10 MG/ML (PF) SYRINGE
PREFILLED_SYRINGE | INTRAVENOUS | Status: AC
  Filled 2020-09-19: qty 10

## 2020-09-19 MED ORDER — CEFAZOLIN SODIUM-DEXTROSE 2-4 GM/100ML-% IV SOLN
INTRAVENOUS | Status: AC
  Filled 2020-09-19: qty 100

## 2020-09-19 SURGICAL SUPPLY — 39 items
ADH SKN CLS APL DERMABOND .7 (GAUZE/BANDAGES/DRESSINGS) ×1
APL PRP STRL LF DISP 70% ISPRP (MISCELLANEOUS) ×1
BLADE CLIPPER SURG (BLADE) IMPLANT
BLADE SURG 15 STRL LF DISP TIS (BLADE) ×1 IMPLANT
BLADE SURG 15 STRL SS (BLADE) ×3
CHLORAPREP W/TINT 26 (MISCELLANEOUS) ×3 IMPLANT
COVER BACK TABLE 60X90IN (DRAPES) ×3 IMPLANT
COVER WAND RF STERILE (DRAPES) IMPLANT
DECANTER SPIKE VIAL GLASS SM (MISCELLANEOUS) IMPLANT
DERMABOND ADVANCED (GAUZE/BANDAGES/DRESSINGS) ×2
DERMABOND ADVANCED .7 DNX12 (GAUZE/BANDAGES/DRESSINGS) ×1 IMPLANT
DRAIN PENROSE 1/2X12 LTX STRL (WOUND CARE) IMPLANT
DRAPE LAPAROSCOPIC ABDOMINAL (DRAPES) ×3 IMPLANT
DRAPE UTILITY 15X26 TOWEL STRL (DRAPES) ×3 IMPLANT
DRAPE UTILITY XL STRL (DRAPES) ×3 IMPLANT
ELECT REM PT RETURN 9FT ADLT (ELECTROSURGICAL) ×3
ELECTRODE REM PT RTRN 9FT ADLT (ELECTROSURGICAL) ×1 IMPLANT
GLOVE BIOGEL PI IND STRL 6 (GLOVE) ×1 IMPLANT
GLOVE BIOGEL PI INDICATOR 6 (GLOVE) ×2
GLOVE SURG POLY MICRO LF SZ5.5 (GLOVE) ×3 IMPLANT
GLOVE SURG SS PI 7.0 STRL IVOR (GLOVE) ×2 IMPLANT
GLOVE SURG UNDER POLY LF SZ7 (GLOVE) ×4 IMPLANT
GOWN STRL REUS W/TWL LRG LVL3 (GOWN DISPOSABLE) ×6 IMPLANT
NEEDLE HYPO 22GX1.5 SAFETY (NEEDLE) ×3 IMPLANT
PACK BASIN DAY SURGERY FS (CUSTOM PROCEDURE TRAY) ×3 IMPLANT
PENCIL SMOKE EVACUATOR (MISCELLANEOUS) ×3 IMPLANT
SLEEVE SCD COMPRESS KNEE MED (MISCELLANEOUS) ×3 IMPLANT
SPONGE LAP 4X18 RFD (DISPOSABLE) ×3 IMPLANT
SUT MNCRL AB 4-0 PS2 18 (SUTURE) ×3 IMPLANT
SUT NOVA NAB DX-16 0-1 5-0 T12 (SUTURE) ×2 IMPLANT
SUT PDS AB 2-0 CT2 27 (SUTURE) ×2 IMPLANT
SUT SILK 3-0 (SUTURE)
SUT SILK 3-0 RB1 30XBRD (SUTURE)
SUT VIC AB 3-0 SH 27 (SUTURE) ×3
SUT VIC AB 3-0 SH 27X BRD (SUTURE) ×3 IMPLANT
SUTURE SILK 3-0 RB1 30XBRD (SUTURE) IMPLANT
SYR BULB IRRIG 60ML STRL (SYRINGE) ×1 IMPLANT
SYR CONTROL 10ML LL (SYRINGE) ×3 IMPLANT
TOWEL GREEN STERILE FF (TOWEL DISPOSABLE) ×3 IMPLANT

## 2020-09-19 NOTE — Anesthesia Procedure Notes (Signed)
Procedure Name: Intubation Date/Time: 09/19/2020 8:43 AM Performed by: Willa Frater, CRNA Pre-anesthesia Checklist: Patient identified, Emergency Drugs available, Suction available and Patient being monitored Patient Re-evaluated:Patient Re-evaluated prior to induction Oxygen Delivery Method: Circle system utilized Preoxygenation: Pre-oxygenation with 100% oxygen Induction Type: IV induction Ventilation: Mask ventilation without difficulty Laryngoscope Size: Glidescope, Mac and 3 Grade View: Grade II Tube type: Oral Number of attempts: 1 Airway Equipment and Method: Stylet,  Oral airway and Video-laryngoscopy Placement Confirmation: ETT inserted through vocal cords under direct vision,  positive ETCO2 and breath sounds checked- equal and bilateral Secured at: 21 cm Tube secured with: Tape Dental Injury: Teeth and Oropharynx as per pre-operative assessment

## 2020-09-19 NOTE — H&P (Signed)
Lynn Kim is an 79 y.o. female.   Chief Complaint: hernia HPI: Ms. Kim is a 79 yo female with an umbilical hernia. It causes her discomfort. No signs of bowel obstruction or strangulation. She presents today for repair. COVID negative on 12/20.  Past Medical History:  Diagnosis Date  . Anxiety    PAST HISTORY  . Arthritis    bilateral hands  . Depression    genetic gene  . Heart murmur    CHILDHOOD FUNCTIONAL HEART MURMUR  . Osteoporosis   . Umbilical hernia   . Vertigo     Past Surgical History:  Procedure Laterality Date  . APPENDECTOMY    . BACK SURGERY    . BREAST EXCISIONAL BIOPSY    . BUNIONECTOMY    . COLONOSCOPY    . HYSTEROSCOPY WITH D & C N/A 06/15/2014   Procedure: DILATATION AND CURETTAGE /HYSTEROSCOPY;  Surgeon: Sharon Seller, DO;  Location: [facility];  Service: Gynecology;  Laterality: N/A;  w/Polypectomy  . left breast cyst removal    . TONSILLECTOMY      Family History  Problem Relation Age of Onset  . Asthma Sister    Social History:  reports that she has quit smoking. She quit smokeless tobacco use about 57 years ago. She reports previous alcohol use. She reports that she does not use drugs.  Allergies:  Allergies  Allergen Reactions  . Eggs Or Egg-Derived Products Nausea And Vomiting  . Food     Almonds, cane sugar, grains   . E-Mycin [Erythromycin] Rash  . Erythromycin Ethylsuccinate Rash  . Lanolin Rash  . Latex Rash  . Penicillins Rash  . Petrolatum Rash  . Sertraline Hcl Anxiety  . Sulfa Antibiotics Rash    Medications Prior to Admission  Medication Sig Dispense Refill  . azelastine (ASTELIN) 0.1 % nasal spray Place 2 sprays into both nostrils 2 (two) times daily. 30 mL 5  . Barberry-Oreg Grape-Goldenseal (BERBERINE COMPLEX PO) Take 500 mg by mouth 2 (two) times daily.    Marland Kitchen CALCIUM-MAGNESIUM-VITAMIN D PO Take 1 tablet by mouth 2 (two) times daily.    . Calcium-Vitamin D-Vitamin K 500-500-40 MG-UNT-MCG CHEW Chew 1 tablet by  mouth daily with lunch.    . Cholecalciferol (VITAMIN D3) 2000 UNITS TABS Take 1 tablet by mouth 2 (two) times daily.    . cyanocobalamin 2000 MCG tablet Take 2,000 mcg by mouth daily.    . Digestive Enzymes (DIGESTIVE ENZYME PO) Take 1 tablet by mouth 2 (two) times daily before lunch and supper.    . escitalopram (LEXAPRO) 5 MG tablet Take 5 mg by mouth daily.    Marland Kitchen guaiFENesin (MUCINEX) 600 MG 12 hr tablet Take by mouth 2 (two) times daily.    Marland Kitchen L-Theanine 100 MG CAPS Take 100 mg by mouth 2 (two) times daily.    Marland Kitchen loratadine (CLARITIN) 10 MG tablet Take 10 mg by mouth daily.    . Menaquinone-7 (VITAMIN K2 PO) Take by mouth.    . Misc Natural Products (T-RELIEF CBD+13 SL) Place under the tongue.    . Nutritional Supplements (DHEA PO) Take 5 mg by mouth daily.    Marland Kitchen PRESCRIPTION MEDICATION Apply 1 application topically daily. Bi-est cream - compounded at [facility]    . PRESCRIPTION MEDICATION Apply 1 application topically 3 (three) times a week. Estriol - Testosterone Cream - three times weekly (Sunday, Tuesday, Friday) Compounded at [facility]    . PRESCRIPTION MEDICATION Take 1 capsule  by mouth daily. Progesterone SR 100 mg capsules - compounded at [facility]    . Probiotic Product (ALIGN PO) Take 1 capsule by mouth 1 day or 1 dose.    . Turmeric Curcumin 500 MG CAPS Take 1 capsule by mouth daily.      Results for orders placed or performed during the hospital encounter of 09/17/20 (from the past 48 hour(s))  SARS CORONAVIRUS 2 (TAT 6-24 HRS) Nasopharyngeal Nasopharyngeal Swab     Status: None   Collection Time: 09/17/20 12:33 PM   Specimen: Nasopharyngeal Swab  Result Value Ref Range   SARS Coronavirus 2 NEGATIVE NEGATIVE    Comment: (NOTE) SARS-CoV-2 target nucleic acids are NOT DETECTED.  The SARS-CoV-2 RNA is generally detectable in upper and lower respiratory specimens during the acute phase of infection. Negative results do not preclude  SARS-CoV-2 infection, do not rule out co-infections with other pathogens, and should not be used as the sole basis for treatment or other patient management decisions. Negative results must be combined with clinical observations, patient history, and epidemiological information. The expected result is Negative.  Fact Sheet for Patients: HairSlick.no  Fact Sheet for Healthcare Providers: quierodirigir.com  This test is not yet approved or cleared by the [place] FDA and  has been authorized for detection and/or diagnosis of SARS-CoV-2 by FDA under an Emergency Use Authorization (EUA). This EUA will remain  in effect (meaning this test can be used) for the duration of the COVID-19 declaration under Se ction 564(b)(1) of the Act, 21 U.S.C. section 360bbb-3(b)(1), unless the authorization is terminated or revoked sooner.  Performed at [facility], [address], [place], [place] [address]    No results found.  Review of Systems  Blood pressure (!) 137/59, pulse 77, temperature 98.4 F (36.9 C), temperature source Oral, resp. rate 16, height 5\' 3"  (1.6 m), weight 55 kg, SpO2 97 %. Physical Exam Constitutional:      Appearance: Normal appearance.  HENT:     Head: Normocephalic and atraumatic.  Eyes:     General: No scleral icterus. Pulmonary:     Effort: Pulmonary effort is normal. No respiratory distress.  Abdominal:     General: There is no distension.     Palpations: Abdomen is soft.     Tenderness: There is no abdominal tenderness.     Comments: Small umbilical hernia  Musculoskeletal:        General: Normal range of motion.     Cervical back: Normal range of motion.  Skin:    General: Skin is warm and dry.  Neurological:     General: No focal deficit present.     Mental Status: She is alert and oriented to person, place, and time.  Psychiatric:        Mood and Affect: Mood normal.        Behavior:  Behavior normal.        Thought Content: Thought content normal.      Assessment/Plan 79 yo female with umbilical hernia. Proceed to OR for open repair. Possibility of mesh placement was discussed with the patient. We also discussed her postop activity restrictions, as she is very active. Plan for discharge home from PACU.  64, MD 09/19/2020, 8:30 AM

## 2020-09-19 NOTE — Op Note (Signed)
Date: 09/19/20  Patient: Lynn Kim MRN: [id number]  Preoperative Diagnosis: Umbilical hernia Postoperative Diagnosis: Same  Procedure: Open umbilical hernia repair  Surgeon: Sophronia Simas, MD  EBL: Minimal  Anesthesia: General endotracheal  Specimens: None  Indications: [name] is a 79 yo female who presented with a symptomatic umbilical hernia. She elected to proceed with open repair after a discussion of the risks and benefits of surgery.  Findings: Umbilical hernia containing preperitoneal fat, 1cm fascial defect, repaired primarily.  Procedure details: Informed consent was obtained in the preoperative area prior to the procedure. The patient was brought to the operating room and placed on the table in the supine position. General anesthesia was induced and appropriate lines and drains were placed for intraoperative monitoring. Perioperative antibiotics were administered per SCIP guidelines. The abdomen was prepped and draped in the usual sterile fashion. A pre-procedure timeout was taken verifying patient identity, surgical site and procedure to be performed.  A curvilinear infraumbilical skin incision was made. The subcutaneous tissue was divided with cautery down to the fascia. The umbilical stalk was circumferentially dissected out and the overlying skin was taken off the stalk sharply. This exposed the hernia sac just to the left of the stalk. The sac was dissected off the surrounding fascia and excised. The hernia contained a small amount of preperitoneal fat, which was partially reduced. The remainder was amputated and discarded. The fascial defect was approximately 1cm in diameter. The hernia defect was closed with interrupted 1 Novofil suture (total of 4 sutures). The wound was infiltrated with 0.25% bupivicaine. The umbilical skin was tacked down to the fascia using a 3-0 Vicryl suture. A deep dermal layer of interrupted 3-0 Vicryl was placed. The skin was closed with  running subcuticular 4-0 monocryl suture.  The patient tolerated the procedure well with no apparent complications. All counts were correct x2 at the end of the procedure. The patient was extubated and taken to PACU in stable condition.  Sophronia Simas, MD 09/19/20 9:30 AM

## 2020-09-19 NOTE — Transfer of Care (Signed)
Immediate Anesthesia Transfer of Care Note  Patient: Lynn Kim  Procedure(s) Performed: OPEN UMBILICAL HERNIA REPAIR (N/A Abdomen)  Patient Location: PACU  Anesthesia Type:General  Level of Consciousness: awake, alert , oriented, drowsy and patient cooperative  Airway & Oxygen Therapy: Patient Spontanous Breathing and Patient connected to face mask oxygen  Post-op Assessment: Report given to RN and Post -op Vital signs reviewed and stable  Post vital signs: Reviewed and stable  Last Vitals:  Vitals Value Taken Time  BP 144/60 09/19/20 0934  Temp    Pulse 90 09/19/20 0935  Resp 19 09/19/20 0935  SpO2 100 % 09/19/20 0935  Vitals shown include unvalidated device data.  Last Pain:  Vitals:   09/19/20 0701  TempSrc: Oral  PainSc: 0-No pain      Patients Stated Pain Goal: 3 (09/19/20 0701)  Complications: No complications documented.

## 2020-09-19 NOTE — Discharge Instructions (Signed)
[facility] DISCHARGE INSTRUCTIONS  Activity . No heavy lifting greater than 10 pounds for 6 weeks after surgery. [place] to shower in 48 yours after surgery, but do not bathe or submerge incisions underwater. . Do not drive while taking narcotic pain medication.  Wound Care . Your incision is covered with skin glue called Dermabond. This will peel off on its own over time. . You may shower and allow warm soapy water to run over your incisions. Gently pat dry. . Do not submerge your incision underwater. . Monitor your incision for any new redness, tenderness, or drainage.  When to Call us: Marland Kitchen Fever greater than 100.5 . New redness, drainage, or swelling at incision site . Severe pain, nausea, or vomiting  Follow-up You have an appointment scheduled with Dr. Freida Busman on October 15, 2020 at 10:30am. This will be at the [facility] office at [address], [address], [place], [place]. Please arrive at least 15 minutes prior to your scheduled appointment time.  For questions or concerns, please call the office at [phone].   No Tylenol until 1:05 pm   Post Anesthesia Home Care Instructions  Activity: Get plenty of rest for the remainder of the day. A responsible individual must stay with you for 24 hours following the procedure.  For the next 24 hours, DO NOT: -Drive a car -Advertising copywriter -Drink alcoholic beverages -Take any medication unless instructed by your physician -Make any legal decisions or sign important papers.  Meals: Start with liquid foods such as gelatin or soup. Progress to regular foods as tolerated. Avoid greasy, spicy, heavy foods. If nausea and/or vomiting occur, drink only clear liquids until the nausea and/or vomiting subsides. Call your physician if vomiting continues.  Special Instructions/Symptoms: Your throat may feel dry or sore from the anesthesia or the breathing tube placed in your throat during surgery. If this causes  discomfort, gargle with warm salt water. The discomfort should disappear within 24 hours.  If you had a scopolamine patch placed behind your ear for the management of post- operative nausea and/or vomiting:  1. The medication in the patch is effective for 72 hours, after which it should be removed.  Wrap patch in a tissue and discard in the trash. Wash hands thoroughly with soap and water. 2. You may remove the patch earlier than 72 hours if you experience unpleasant side effects which may include dry mouth, dizziness or visual disturbances. 3. Avoid touching the patch. Wash your hands with soap and water after contact with the patch.

## 2020-09-19 NOTE — Anesthesia Preprocedure Evaluation (Addendum)
Anesthesia Evaluation  Patient identified by MRN, date of birth, ID band Patient awake    Reviewed: Allergy & Precautions, NPO status , Patient's Chart, lab work & pertinent test results  Airway Mallampati: II  TM Distance: <3 FB   Mouth opening: Limited Mouth Opening  Dental   Pulmonary neg pulmonary ROS, former smoker,    breath sounds clear to auscultation       Cardiovascular negative cardio ROS   Rhythm:Regular     Neuro/Psych PSYCHIATRIC DISORDERS Anxiety Depression    GI/Hepatic negative GI ROS, Neg liver ROS,   Endo/Other  negative endocrine ROS  Renal/GU negative Renal ROS     Musculoskeletal  (+) Arthritis ,   Abdominal   Peds  Hematology   Anesthesia Other Findings   Reproductive/Obstetrics                            Anesthesia Physical Anesthesia Plan  ASA: II  Anesthesia Plan: General   Post-op Pain Management:    Induction: Intravenous  PONV Risk Score and Plan: 3 and Ondansetron, Dexamethasone and Midazolam  Airway Management Planned: Oral ETT and Video Laryngoscope Planned  Additional Equipment:   Intra-op Plan:   Post-operative Plan: Extubation in OR  Informed Consent: I have reviewed the patients History and Physical, chart, labs and discussed the procedure including the risks, benefits and alternatives for the proposed anesthesia with the patient or authorized representative who has indicated his/her understanding and acceptance.     Dental advisory given  Plan Discussed with: CRNA and Anesthesiologist  Anesthesia Plan Comments:        Anesthesia Quick Evaluation

## 2020-09-19 NOTE — Anesthesia Postprocedure Evaluation (Signed)
Anesthesia Post Note  Patient: Lizabeth Fellner D'Arconte  Procedure(s) Performed: OPEN UMBILICAL HERNIA REPAIR (N/A Abdomen)     Patient location during evaluation: PACU Anesthesia Type: General Level of consciousness: awake and alert and oriented Pain management: pain level controlled Vital Signs Assessment: post-procedure vital signs reviewed and stable Respiratory status: spontaneous breathing, nonlabored ventilation and respiratory function stable Cardiovascular status: blood pressure returned to baseline and stable Postop Assessment: no apparent nausea or vomiting Anesthetic complications: no   No complications documented.  Last Vitals:  Vitals:   09/19/20 1000 09/19/20 1015  BP: (!) 125/58 131/61  Pulse: 69 73  Resp: 12 15  Temp:    SpO2: 98% 98%    Last Pain:  Vitals:   09/19/20 1024  TempSrc:   PainSc: 5                  Corynne Scibilia A.

## 2020-09-20 ENCOUNTER — Encounter (HOSPITAL_BASED_OUTPATIENT_CLINIC_OR_DEPARTMENT_OTHER): Payer: Self-pay | Admitting: Surgery

## 2020-10-17 DIAGNOSIS — M19011 Primary osteoarthritis, right shoulder: Secondary | ICD-10-CM | POA: Diagnosis not present

## 2020-10-17 DIAGNOSIS — M65311 Trigger thumb, right thumb: Secondary | ICD-10-CM | POA: Diagnosis not present

## 2020-10-29 DIAGNOSIS — K219 Gastro-esophageal reflux disease without esophagitis: Secondary | ICD-10-CM | POA: Diagnosis not present

## 2020-10-29 DIAGNOSIS — M810 Age-related osteoporosis without current pathological fracture: Secondary | ICD-10-CM | POA: Diagnosis not present

## 2020-11-09 DIAGNOSIS — H2513 Age-related nuclear cataract, bilateral: Secondary | ICD-10-CM | POA: Diagnosis not present

## 2020-11-09 DIAGNOSIS — H52203 Unspecified astigmatism, bilateral: Secondary | ICD-10-CM | POA: Diagnosis not present

## 2020-11-09 DIAGNOSIS — H5010 Unspecified exotropia: Secondary | ICD-10-CM | POA: Diagnosis not present

## 2020-12-17 DIAGNOSIS — M461 Sacroiliitis, not elsewhere classified: Secondary | ICD-10-CM | POA: Diagnosis not present

## 2020-12-17 DIAGNOSIS — M7061 Trochanteric bursitis, right hip: Secondary | ICD-10-CM | POA: Diagnosis not present

## 2020-12-17 DIAGNOSIS — M7062 Trochanteric bursitis, left hip: Secondary | ICD-10-CM | POA: Diagnosis not present

## 2020-12-17 DIAGNOSIS — M65311 Trigger thumb, right thumb: Secondary | ICD-10-CM | POA: Diagnosis not present

## 2020-12-18 DIAGNOSIS — M25551 Pain in right hip: Secondary | ICD-10-CM | POA: Diagnosis not present

## 2020-12-18 DIAGNOSIS — M65311 Trigger thumb, right thumb: Secondary | ICD-10-CM | POA: Diagnosis not present

## 2020-12-18 DIAGNOSIS — M25552 Pain in left hip: Secondary | ICD-10-CM | POA: Diagnosis not present

## 2020-12-24 DIAGNOSIS — K5900 Constipation, unspecified: Secondary | ICD-10-CM | POA: Diagnosis not present

## 2020-12-24 DIAGNOSIS — E559 Vitamin D deficiency, unspecified: Secondary | ICD-10-CM | POA: Diagnosis not present

## 2020-12-24 DIAGNOSIS — M810 Age-related osteoporosis without current pathological fracture: Secondary | ICD-10-CM | POA: Diagnosis not present

## 2020-12-24 DIAGNOSIS — Z780 Asymptomatic menopausal state: Secondary | ICD-10-CM | POA: Diagnosis not present

## 2020-12-24 DIAGNOSIS — R7303 Prediabetes: Secondary | ICD-10-CM | POA: Diagnosis not present

## 2020-12-31 ENCOUNTER — Ambulatory Visit: Payer: Medicare Other | Attending: Sports Medicine | Admitting: Physical Therapy

## 2020-12-31 ENCOUNTER — Encounter: Payer: Self-pay | Admitting: Physical Therapy

## 2020-12-31 ENCOUNTER — Other Ambulatory Visit: Payer: Self-pay

## 2020-12-31 DIAGNOSIS — M25552 Pain in left hip: Secondary | ICD-10-CM

## 2020-12-31 DIAGNOSIS — M25551 Pain in right hip: Secondary | ICD-10-CM

## 2020-12-31 DIAGNOSIS — R252 Cramp and spasm: Secondary | ICD-10-CM | POA: Diagnosis not present

## 2020-12-31 DIAGNOSIS — R262 Difficulty in walking, not elsewhere classified: Secondary | ICD-10-CM

## 2020-12-31 NOTE — Therapy (Signed)
[facility] [address] [place], [place], [address] Phone: [phone]   Fax:  [phone]  Physical Therapy Evaluation  Patient Details  Name: Lynn Kim MRN: [id number] Date of Birth: 23-Jun-1941 Referring Provider (PT): K. Quenten Raven Date: 12/31/2020   PT End of Session - 12/31/20 1350    Visit Number 1    Date for PT Re-Evaluation 03/02/21    Authorization Type UHC Medicare    PT Start Time 1311    PT Stop Time 1410    PT Time Calculation (min) 59 min    Activity Tolerance Patient tolerated treatment well    Behavior During Therapy WFL for tasks assessed/performed           Past Medical History:  Diagnosis Date  . Anxiety    PAST HISTORY  . Arthritis    bilateral hands  . Depression    genetic gene  . Heart murmur    CHILDHOOD FUNCTIONAL HEART MURMUR  . Osteoporosis   . Umbilical hernia   . Vertigo     Past Surgical History:  Procedure Laterality Date  . APPENDECTOMY    . BACK SURGERY    . BREAST EXCISIONAL BIOPSY    . BUNIONECTOMY    . COLONOSCOPY    . HYSTEROSCOPY WITH D & C N/A 06/15/2014   Procedure: DILATATION AND CURETTAGE /HYSTEROSCOPY;  Surgeon: Sharon Seller, DO;  Location: [facility];  Service: Gynecology;  Laterality: N/A;  w/Polypectomy  . left breast cyst removal    . TONSILLECTOMY    . UMBILICAL HERNIA REPAIR N/A 09/19/2020   Procedure: OPEN UMBILICAL HERNIA REPAIR;  Surgeon: Fritzi Mandes, MD;  Location: [facility];  Service: General;  Laterality: N/A;    There were no vitals filed for this visit.    Subjective Assessment - 12/31/20 1317    Subjective Patient reports that she has had bilatral hip pain after starting a body pump class 6 weeks after an umbilical hernia surgery.  She reports that she was doing a lot of squats in these classes.  She reports pain in the mms of the anterior/lateral thighs, she reports very tender just below the iliac crest.     Pertinent History Harrington rods from T2-L5, recent umbilical hernia repair    Limitations House hold activities;Walking    Patient Stated Goals have less pain, easier exercises    Currently in Pain? Yes    Pain Score 1     Pain Location Hip    Pain Orientation Right;Left;Anterior;Lateral    Pain Descriptors / Indicators Sore;Aching;Tightness    Pain Type Acute pain    Pain Onset More than a month ago    Pain Frequency Intermittent    Aggravating Factors  getting up from sitting, stairs, squats, pain up to 9/10    Pain Relieving Factors rest, pain and anti inflammatory medications pain can get down to 0/10    Effect of Pain on Daily Activities unable to squat, difficulty with tairs, difficulty getting up from sitting              [facility] PT Assessment - 12/31/20 0001      Assessment   Medical Diagnosis hip pain    Referring Provider (PT) K. Alexander    Onset Date/Surgical Date 11/30/20    Prior Therapy chest and back issues in the past      Precautions   Precaution Comments has Harrington Rods  Balance Screen   Has the patient fallen in the past 6 months No    Has the patient had a decrease in activity level because of a fear of falling?  No    Is the patient reluctant to leave their home because of a fear of falling?  No      Home Environment   Additional Comments has stairs, does a lot of yardwork      Prior Function   Level of Independence Independent    Vocation Retired    Leisure was doing exercises via Materials engineer Comments slight fwd head, rounded shoulders      AROM   Overall AROM Comments Lumbar ROM was decreased due to Harrington rods, her hips ROM was tight and limited especially for extension      Strength   Overall Strength Comments hips 4/5 with some pain in the lateral hip and thigh area      Flexibility   Soft Tissue Assessment /Muscle Length yes    Hamstrings tight    Quadriceps very tight    ITB very  tight    Piriformis tight      Palpation   Palpation comment she is very tight in the lateral thigh mms, tender in the left SI and the area just below the iliac crest      Ambulation/Gait   Gait Comments reports difficulty with tairs                      Objective measurements completed on examination: See above findings.                 PT Short Term Goals - 12/31/20 1355      PT SHORT TERM GOAL #1   Title independent with initial HEP    Time 2    Period Weeks    Status New             PT Long Term Goals - 12/31/20 1355      PT LONG TERM GOAL #1   Title decrease pain 50%    Time 12    Period Weeks    Status New      PT LONG TERM GOAL #2   Title able to get up from sitting without pain    Time 12    Period Weeks    Status New      PT LONG TERM GOAL #3   Title go up and down stairs step over step    Time 12    Period Weeks    Status New      PT LONG TERM GOAL #4   Title return to class exercise without difficulty    Time 12    Period Weeks    Status New                  Plan - 12/31/20 1351    Clinical Impression Statement Patient reports that she had umbilical hernia surgery in January, she took 6 weeks off and then went back to a virtual class for exercise, she reports that it was mostly squatting and she has had problems since then, she has bilateral hip and lateral thigh pain, some back pain.  She is very tight in the mms around the hip, the lateral thigh mms are tight.  She has good strength but causes some pain in the area just below the iliac crest  and the low back.    Stability/Clinical Decision Making Stable/Uncomplicated    Clinical Decision Making Low    Rehab Potential Good    PT Frequency 2x / week    PT Duration 12 weeks    PT Treatment/Interventions ADLs/Self Care Home Management;Cryotherapy;Electrical Stimulation;Iontophoresis 4mg /ml Dexamethasone;Moist Heat;Ultrasound;Therapeutic exercise;Therapeutic  activities;Patient/family education;Manual techniques;Dry needling;Gait training;Stair training;Functional mobility training    PT Next Visit Plan slowly add exercises and flexibility, try STM to the mmms and see if she would like to try dry needling    Consulted and Agree with Plan of Care Patient           Patient will benefit from skilled therapeutic intervention in order to improve the following deficits and impairments:  Abnormal gait,Decreased range of motion,Difficulty walking,Increased muscle spasms,Pain,Improper body mechanics,Impaired flexibility,Decreased balance,Decreased strength  Visit Diagnosis: Pain in right hip - Plan: PT plan of care cert/re-cert  Pain in left hip - Plan: PT plan of care cert/re-cert  Cramp and spasm - Plan: PT plan of care cert/re-cert  Difficulty in walking, not elsewhere classified - Plan: PT plan of care cert/re-cert     Problem List Patient Active Problem List   Diagnosis Date Noted  . Chest pain 11/29/2014    01/29/2015., PT 12/31/2020, 1:58 PM  [facility] [address] [place], [place], [place] Phone: [phone]   Fax:  [phone]  Name: Lynn Kim MRN: Vic Ripper Date of Birth: [birth year]

## 2020-12-31 NOTE — Patient Instructions (Signed)

## 2021-01-07 ENCOUNTER — Encounter: Payer: Self-pay | Admitting: Physical Therapy

## 2021-01-07 ENCOUNTER — Other Ambulatory Visit: Payer: Self-pay

## 2021-01-07 ENCOUNTER — Ambulatory Visit: Payer: Medicare Other | Admitting: Physical Therapy

## 2021-01-07 DIAGNOSIS — M25552 Pain in left hip: Secondary | ICD-10-CM | POA: Diagnosis not present

## 2021-01-07 DIAGNOSIS — R252 Cramp and spasm: Secondary | ICD-10-CM | POA: Diagnosis not present

## 2021-01-07 DIAGNOSIS — R262 Difficulty in walking, not elsewhere classified: Secondary | ICD-10-CM

## 2021-01-07 DIAGNOSIS — M25551 Pain in right hip: Secondary | ICD-10-CM

## 2021-01-07 NOTE — Therapy (Signed)
[place]. [place], [place], [address] Phone: [phone]   Fax:  [phone]  Physical Therapy Treatment  Patient Details  Name: Lynn Kim MRN: [id number] Date of Birth: 1941-08-04 Referring Provider (PT): K. Sherryle Lis Date: 01/07/2021   PT End of Session - 01/07/21 1405    Visit Number 2    Date for PT Re-Evaluation 03/02/21    Authorization Type UHC Medicare    PT Start Time 1310    PT Stop Time 1414    PT Time Calculation (min) 64 min    Activity Tolerance Patient tolerated treatment well    Behavior During Therapy WFL for tasks assessed/performed           Past Medical History:  Diagnosis Date  . Anxiety    PAST HISTORY  . Arthritis    bilateral hands  . Depression    genetic gene  . Heart murmur    CHILDHOOD FUNCTIONAL HEART MURMUR  . Osteoporosis   . Umbilical hernia   . Vertigo     Past Surgical History:  Procedure Laterality Date  . APPENDECTOMY    . BACK SURGERY    . BREAST EXCISIONAL BIOPSY    . BUNIONECTOMY    . COLONOSCOPY    . HYSTEROSCOPY WITH D & C N/A 06/15/2014   Procedure: DILATATION AND CURETTAGE /HYSTEROSCOPY;  Surgeon: Annalee Genta, DO;  Location: [facility];  Service: Gynecology;  Laterality: N/A;  w/Polypectomy  . left breast cyst removal    . TONSILLECTOMY    . UMBILICAL HERNIA REPAIR N/A 09/19/2020   Procedure: OPEN UMBILICAL HERNIA REPAIR;  Surgeon: Dwan Bolt, MD;  Location: [facility];  Service: General;  Laterality: N/A;    There were no vitals filed for this visit.   Subjective Assessment - 01/07/21 1402    Subjective Patient reports some questions about stretches.  Reports bilatereal hip pain "muscle"    Currently in Pain? Yes    Pain Score 3     Pain Location Hip    Pain Orientation Right;Left;Anterior;Lateral    Pain Descriptors / Indicators Tightness                             [facility] Adult PT  Treatment/Exercise - 01/07/21 0001      Modalities   Modalities Electrical Stimulation;Moist Heat      Moist Heat Therapy   Number Minutes Moist Heat 12 Minutes    Moist Heat Location Hip      Electrical Stimulation   Electrical Stimulation Location bilateral hips    Electrical Stimulation Action premod    Electrical Stimulation Parameters supine    Electrical Stimulation Goals Pain      Manual Therapy   Manual Therapy Soft tissue mobilization;Passive ROM    Soft tissue mobilization with use of roller and vibration on the biltareal hip and LE mms    Passive ROM stretches of the HS, piriformis, ITB, hip flexor, calf and quad                    PT Short Term Goals - 01/07/21 1407      PT SHORT TERM GOAL #1   Title independent with initial HEP    Status Partially Met             PT Long Term Goals - 12/31/20 1355  PT LONG TERM GOAL #1   Title decrease pain 50%    Time 12    Period Weeks    Status New      PT LONG TERM GOAL #2   Title able to get up from sitting without pain    Time 12    Period Weeks    Status New      PT LONG TERM GOAL #3   Title go up and down stairs step over step    Time 12    Period Weeks    Status New      PT LONG TERM GOAL #4   Title return to class exercise without difficulty    Time 12    Period Weeks    Status New                 Plan - 01/07/21 1406    Clinical Impression Statement REally good focus on passive stretches of the LE with particular focus on the TFL, ITB and hip flexors, started STM to these areas as weee, she was very tender especially in the right ITB and TFL area wiht spasms present    PT Next Visit Plan slowly add exercises and flexibility, try STM to the mmms and see if she would like to try dry needling    Consulted and Agree with Plan of Care Patient           Patient will benefit from skilled therapeutic intervention in order to improve the following deficits and impairments:   Abnormal gait,Decreased range of motion,Difficulty walking,Increased muscle spasms,Pain,Improper body mechanics,Impaired flexibility,Decreased balance,Decreased strength  Visit Diagnosis: Pain in right hip  Pain in left hip  Cramp and spasm  Difficulty in walking, not elsewhere classified     Problem List Patient Active Problem List   Diagnosis Date Noted  . Chest pain 11/29/2014    Sumner Boast., PT 01/07/2021, 2:07 PM  [place]. [place], [place], [address] Phone: [phone]   Fax:  [phone]  Name: Lynn Kim MRN: [id number] Date of Birth: [birth year]

## 2021-01-16 ENCOUNTER — Encounter: Payer: Self-pay | Admitting: Physical Therapy

## 2021-01-16 ENCOUNTER — Other Ambulatory Visit: Payer: Self-pay

## 2021-01-16 ENCOUNTER — Ambulatory Visit: Payer: Medicare Other | Admitting: Physical Therapy

## 2021-01-16 DIAGNOSIS — M25552 Pain in left hip: Secondary | ICD-10-CM

## 2021-01-16 DIAGNOSIS — R262 Difficulty in walking, not elsewhere classified: Secondary | ICD-10-CM | POA: Diagnosis not present

## 2021-01-16 DIAGNOSIS — M25551 Pain in right hip: Secondary | ICD-10-CM | POA: Diagnosis not present

## 2021-01-16 DIAGNOSIS — R252 Cramp and spasm: Secondary | ICD-10-CM

## 2021-01-16 NOTE — Therapy (Signed)
[facility] [address] [place], [place], [address] Phone: [phone]   Fax:  [phone]  Physical Therapy Treatment  Patient Details  Name: CHRISTALYNN BOISE MRN: [id number] Date of Birth: Feb 01, 1941 Referring Provider (PT): K. Quenten Raven Date: 01/16/2021   PT End of Session - 01/16/21 1353    Visit Number 3    Date for PT Re-Evaluation 03/02/21    Authorization Type UHC Medicare    PT Start Time 1310    PT Stop Time 1410    PT Time Calculation (min) 60 min    Activity Tolerance Patient tolerated treatment well    Behavior During Therapy WFL for tasks assessed/performed           Past Medical History:  Diagnosis Date  . Anxiety    PAST HISTORY  . Arthritis    bilateral hands  . Depression    genetic gene  . Heart murmur    CHILDHOOD FUNCTIONAL HEART MURMUR  . Osteoporosis   . Umbilical hernia   . Vertigo     Past Surgical History:  Procedure Laterality Date  . APPENDECTOMY    . BACK SURGERY    . BREAST EXCISIONAL BIOPSY    . BUNIONECTOMY    . COLONOSCOPY    . HYSTEROSCOPY WITH D & C N/A 06/15/2014   Procedure: DILATATION AND CURETTAGE /HYSTEROSCOPY;  Surgeon: Sharon Seller, DO;  Location: [facility];  Service: Gynecology;  Laterality: N/A;  w/Polypectomy  . left breast cyst removal    . TONSILLECTOMY    . UMBILICAL HERNIA REPAIR N/A 09/19/2020   Procedure: OPEN UMBILICAL HERNIA REPAIR;  Surgeon: Fritzi Mandes, MD;  Location: [facility];  Service: General;  Laterality: N/A;    There were no vitals filed for this visit.   Subjective Assessment - 01/16/21 1314    Pain Relieving Factors the stretching helped                             [facility] Adult PT Treatment/Exercise - 01/16/21 0001      Moist Heat Therapy   Number Minutes Moist Heat 12 Minutes    Moist Heat Location Hip      Electrical Stimulation   Electrical Stimulation Location bilateral hips     Electrical Stimulation Action premod    Electrical Stimulation Parameters supine    Electrical Stimulation Goals Pain      Manual Therapy   Manual Therapy Soft tissue mobilization;Passive ROM    Soft tissue mobilization with use of roller and vibration on the biltareal hip and LE mms    Passive ROM stretches of the HS, piriformis, ITB, hip flexor, calf and quad                    PT Short Term Goals - 01/16/21 1355      PT SHORT TERM GOAL #1   Title independent with initial HEP    Status Achieved             PT Long Term Goals - 12/31/20 1355      PT LONG TERM GOAL #1   Title decrease pain 50%    Time 12    Period Weeks    Status New      PT LONG TERM GOAL #2   Title able to get up from sitting without pain    Time 12  Period Weeks    Status New      PT LONG TERM GOAL #3   Title go up and down stairs step over step    Time 12    Period Weeks    Status New      PT LONG TERM GOAL #4   Title return to class exercise without difficulty    Time 12    Period Weeks    Status New                 Plan - 01/16/21 1354    Clinical Impression Statement Patient is tight in the ITB, hip flexors.  Tight adductors.  She had tenderness and spasms in the ITB and hip flexors.  Does well with stretches, she is very very active and does a lot of exercises at the gym    PT Next Visit Plan slowly add exercises and flexibility, try STM to the mmms and see if she would like to try dry needling    Consulted and Agree with Plan of Care Patient           Patient will benefit from skilled therapeutic intervention in order to improve the following deficits and impairments:  Abnormal gait,Decreased range of motion,Difficulty walking,Increased muscle spasms,Pain,Improper body mechanics,Impaired flexibility,Decreased balance,Decreased strength  Visit Diagnosis: Pain in right hip  Pain in left hip  Cramp and spasm  Difficulty in walking, not elsewhere  classified     Problem List Patient Active Problem List   Diagnosis Date Noted  . Chest pain 11/29/2014    Jearld Lesch., PT 01/16/2021, 1:56 PM  [facility] [address] [place], [place], [address] Phone: [phone]   Fax:  [phone]  Name: DESPINA BOAN MRN: [id number] Date of Birth: 25-Dec-1940

## 2021-01-23 ENCOUNTER — Ambulatory Visit: Payer: Medicare Other | Admitting: Physical Therapy

## 2021-01-23 ENCOUNTER — Encounter: Payer: Self-pay | Admitting: Physical Therapy

## 2021-01-23 ENCOUNTER — Other Ambulatory Visit: Payer: Self-pay

## 2021-01-23 DIAGNOSIS — R252 Cramp and spasm: Secondary | ICD-10-CM

## 2021-01-23 DIAGNOSIS — R262 Difficulty in walking, not elsewhere classified: Secondary | ICD-10-CM | POA: Diagnosis not present

## 2021-01-23 DIAGNOSIS — M25552 Pain in left hip: Secondary | ICD-10-CM | POA: Diagnosis not present

## 2021-01-23 DIAGNOSIS — M25551 Pain in right hip: Secondary | ICD-10-CM | POA: Diagnosis not present

## 2021-01-23 NOTE — Therapy (Signed)
[facility] [address] [place], [place], [address] Phone: [phone]   Fax:  [phone]  Physical Therapy Treatment  Patient Details  Name: NIKKI GLANZER MRN: [id number] Date of Birth: [birth year] Referring Provider (PT): K. Quenten Raven Date: 01/23/2021   PT End of Session - 01/23/21 1352    Visit Number 4    Date for PT Re-Evaluation 03/02/21    Authorization Type UHC Medicare    PT Start Time 1308    PT Stop Time 1408    PT Time Calculation (min) 60 min    Activity Tolerance Patient tolerated treatment well    Behavior During Therapy WFL for tasks assessed/performed           Past Medical History:  Diagnosis Date  . Anxiety    PAST HISTORY  . Arthritis    bilateral hands  . Depression    genetic gene  . Heart murmur    CHILDHOOD FUNCTIONAL HEART MURMUR  . Osteoporosis   . Umbilical hernia   . Vertigo     Past Surgical History:  Procedure Laterality Date  . APPENDECTOMY    . BACK SURGERY    . BREAST EXCISIONAL BIOPSY    . BUNIONECTOMY    . COLONOSCOPY    . HYSTEROSCOPY WITH D & C N/A 06/15/2014   Procedure: DILATATION AND CURETTAGE /HYSTEROSCOPY;  Surgeon: Sharon Seller, DO;  Location: [facility];  Service: Gynecology;  Laterality: N/A;  w/Polypectomy  . left breast cyst removal    . TONSILLECTOMY    . UMBILICAL HERNIA REPAIR N/A 09/19/2020   Procedure: OPEN UMBILICAL HERNIA REPAIR;  Surgeon: Fritzi Mandes, MD;  Location: [facility];  Service: General;  Laterality: N/A;    There were no vitals filed for this visit.   Subjective Assessment - 01/23/21 1311    Subjective I think it is getting better, just sore and tight    Currently in Pain? Yes    Pain Score 3     Pain Location Hip    Pain Orientation Right;Left;Anterior;Lateral    Pain Relieving Factors the stretching helped                             [facility] Adult PT Treatment/Exercise - 01/23/21  0001      Moist Heat Therapy   Number Minutes Moist Heat 12 Minutes    Moist Heat Location Hip      Electrical Stimulation   Electrical Stimulation Location bilateral hips    Electrical Stimulation Action premod    Electrical Stimulation Parameters supine    Electrical Stimulation Goals Pain      Manual Therapy   Manual Therapy Soft tissue mobilization;Passive ROM    Soft tissue mobilization with use of roller and vibration on the biltareal hip and LE mms    Passive ROM stretches of the HS, piriformis, ITB, hip flexor, calf and quad                    PT Short Term Goals - 01/16/21 1355      PT SHORT TERM GOAL #1   Title independent with initial HEP    Status Achieved             PT Long Term Goals - 01/23/21 1357      PT LONG TERM GOAL #1   Title decrease pain 50%  Status On-going      PT LONG TERM GOAL #2   Title able to get up from sitting without pain    Status On-going      PT LONG TERM GOAL #3   Title go up and down stairs step over step    Status On-going                 Plan - 01/23/21 1356    Clinical Impression Statement Patient reports that she is doing better, still tight in the hips anterior and latral, she is very tender and sore in this area and into the ITB's, she is very active on her own at the gym    PT Next Visit Plan slowly add exercises and flexibility, try STM to the mmms and see if she would like to try dry needling    Consulted and Agree with Plan of Care Patient           Patient will benefit from skilled therapeutic intervention in order to improve the following deficits and impairments:  Abnormal gait,Decreased range of motion,Difficulty walking,Increased muscle spasms,Pain,Improper body mechanics,Impaired flexibility,Decreased balance,Decreased strength  Visit Diagnosis: Pain in right hip  Pain in left hip  Cramp and spasm  Difficulty in walking, not elsewhere classified     Problem List Patient  Active Problem List   Diagnosis Date Noted  . Chest pain 11/29/2014    Jearld Lesch., PT 01/23/2021, 1:58 PM  [facility] [address] [place], [place], [address] Phone: [phone]   Fax:  [phone]  Name: JAIMARIE RAPOZO MRN: [id number] Date of Birth: 1941/01/09

## 2021-01-25 DIAGNOSIS — K219 Gastro-esophageal reflux disease without esophagitis: Secondary | ICD-10-CM | POA: Diagnosis not present

## 2021-01-25 DIAGNOSIS — M810 Age-related osteoporosis without current pathological fracture: Secondary | ICD-10-CM | POA: Diagnosis not present

## 2021-01-30 ENCOUNTER — Other Ambulatory Visit: Payer: Self-pay

## 2021-01-30 ENCOUNTER — Encounter: Payer: Self-pay | Admitting: Physical Therapy

## 2021-01-30 ENCOUNTER — Ambulatory Visit: Payer: Medicare Other | Attending: Sports Medicine | Admitting: Physical Therapy

## 2021-01-30 DIAGNOSIS — M25551 Pain in right hip: Secondary | ICD-10-CM | POA: Diagnosis not present

## 2021-01-30 DIAGNOSIS — R252 Cramp and spasm: Secondary | ICD-10-CM

## 2021-01-30 DIAGNOSIS — R262 Difficulty in walking, not elsewhere classified: Secondary | ICD-10-CM | POA: Diagnosis not present

## 2021-01-30 DIAGNOSIS — M25552 Pain in left hip: Secondary | ICD-10-CM | POA: Diagnosis not present

## 2021-01-30 NOTE — Therapy (Signed)
[place] [place], [place], [address] Phone: [phone]   Fax:  [phone]  Physical Therapy Treatment  Patient Details  Name: [place] Kim MRN: [id number] Date of Birth: 11-01-1940 Referring Provider (PT): K. Sherryle Lis Date: 01/30/2021   PT End of Session - 01/30/21 1544    Visit Number 5    Date for PT Re-Evaluation 03/02/21    Authorization Type UHC Medicare    PT Start Time 1307    PT Stop Time 1405    PT Time Calculation (min) 58 min    Activity Tolerance Patient tolerated treatment well    Behavior During Therapy WFL for tasks assessed/performed           Past Medical History:  Diagnosis Date  . Anxiety    PAST HISTORY  . Arthritis    bilateral hands  . Depression    genetic gene  . Heart murmur    CHILDHOOD FUNCTIONAL HEART MURMUR  . Osteoporosis   . Umbilical hernia   . Vertigo     Past Surgical History:  Procedure Laterality Date  . APPENDECTOMY    . BACK SURGERY    . BREAST EXCISIONAL BIOPSY    . BUNIONECTOMY    . COLONOSCOPY    . HYSTEROSCOPY WITH D & C N/A 06/15/2014   Procedure: DILATATION AND CURETTAGE /HYSTEROSCOPY;  Surgeon: Annalee Genta, DO;  Location: [facility];  Service: Gynecology;  Laterality: N/A;  w/Polypectomy  . left breast cyst removal    . TONSILLECTOMY    . UMBILICAL HERNIA REPAIR N/A 09/19/2020   Procedure: OPEN UMBILICAL HERNIA REPAIR;  Surgeon: Dwan Bolt, MD;  Location: [facility];  Service: General;  Laterality: N/A;    There were no vitals filed for this visit.                      [facility] Adult PT Treatment/Exercise - 01/30/21 0001      Exercises   Exercises Knee/Hip      Knee/Hip Exercises: Supine   Other Supine Knee/Hip Exercises feet on ball K2C, trunk rotaiton, small bridge, isometric abs, seated ball b/n knees ER red tband hips      Moist Heat Therapy   Number Minutes Moist Heat 12 Minutes     Moist Heat Location Hip      Electrical Stimulation   Electrical Stimulation Location right hip    Electrical Stimulation Action IFC    Electrical Stimulation Parameters supine    Electrical Stimulation Goals Pain      Manual Therapy   Manual Therapy Soft tissue mobilization;Passive ROM    Soft tissue mobilization with use of roller and vibration on the biltareal hip and LE mms    Passive ROM stretches of the HS, piriformis, ITB, hip flexor, calf and quad                    PT Short Term Goals - 01/16/21 1355      PT SHORT TERM GOAL #1   Title independent with initial HEP    Status Achieved             PT Long Term Goals - 01/30/21 1550      PT LONG TERM GOAL #1   Title decrease pain 50%    Status On-going      PT LONG TERM GOAL #2   Title able to get up from sitting  without pain    Status On-going      PT LONG TERM GOAL #3   Title go up and down stairs step over step    Status On-going      PT LONG TERM GOAL #4   Title return to class exercise without difficulty    Status Partially Met                 Plan - 01/30/21 1549    Clinical Impression Statement Patient with increased c/o tightnes sin the hips today, she is reporting doing a lot at the gym and walking, she still is having some pain int he hips, the right ITB, ischial area and the hip flexor are tight and tender today    PT Next Visit Plan continue to progress    Consulted and Agree with Plan of Care Patient           Patient will benefit from skilled therapeutic intervention in order to improve the following deficits and impairments:  Abnormal gait,Decreased range of motion,Difficulty walking,Increased muscle spasms,Pain,Improper body mechanics,Impaired flexibility,Decreased balance,Decreased strength  Visit Diagnosis: Pain in right hip  Pain in left hip  Cramp and spasm  Difficulty in walking, not elsewhere classified     Problem List Patient Active Problem List    Diagnosis Date Noted  . Chest pain 11/29/2014    Lynn Boast., PT 01/30/2021, 3:51 PM  [place] [place], [place], [address] Phone: [phone]   Fax:  [phone]  Name: [place] Kim MRN: [id number] Date of Birth: 09-01-1941

## 2021-01-31 DIAGNOSIS — M25511 Pain in right shoulder: Secondary | ICD-10-CM | POA: Diagnosis not present

## 2021-01-31 DIAGNOSIS — M25552 Pain in left hip: Secondary | ICD-10-CM | POA: Diagnosis not present

## 2021-01-31 DIAGNOSIS — M25512 Pain in left shoulder: Secondary | ICD-10-CM | POA: Diagnosis not present

## 2021-01-31 DIAGNOSIS — M25551 Pain in right hip: Secondary | ICD-10-CM | POA: Diagnosis not present

## 2021-02-06 ENCOUNTER — Ambulatory Visit: Payer: Medicare Other | Admitting: Physical Therapy

## 2021-02-06 ENCOUNTER — Other Ambulatory Visit: Payer: Self-pay

## 2021-02-06 ENCOUNTER — Encounter: Payer: Self-pay | Admitting: Physical Therapy

## 2021-02-06 DIAGNOSIS — R252 Cramp and spasm: Secondary | ICD-10-CM

## 2021-02-06 DIAGNOSIS — M25551 Pain in right hip: Secondary | ICD-10-CM

## 2021-02-06 DIAGNOSIS — R262 Difficulty in walking, not elsewhere classified: Secondary | ICD-10-CM

## 2021-02-06 DIAGNOSIS — M25552 Pain in left hip: Secondary | ICD-10-CM | POA: Diagnosis not present

## 2021-02-06 NOTE — Therapy (Signed)
[place]. [place], [place], [address] Phone: [phone]   Fax:  [phone]  Physical Therapy Treatment  Patient Details  Name: Lynn Kim MRN: [id number] Date of Birth: Apr 13, 1941 Referring Provider (PT): K. Sherryle Lis Date: 02/06/2021   PT End of Session - 02/06/21 1402    Visit Number 6    Date for PT Re-Evaluation 03/02/21    Authorization Type UHC Medicare    PT Start Time 1308    PT Stop Time 1408    PT Time Calculation (min) 60 min    Activity Tolerance Patient tolerated treatment well    Behavior During Therapy WFL for tasks assessed/performed           Past Medical History:  Diagnosis Date  . Anxiety    PAST HISTORY  . Arthritis    bilateral hands  . Depression    genetic gene  . Heart murmur    CHILDHOOD FUNCTIONAL HEART MURMUR  . Osteoporosis   . Umbilical hernia   . Vertigo     Past Surgical History:  Procedure Laterality Date  . APPENDECTOMY    . BACK SURGERY    . BREAST EXCISIONAL BIOPSY    . BUNIONECTOMY    . COLONOSCOPY    . HYSTEROSCOPY WITH D & C N/A 06/15/2014   Procedure: DILATATION AND CURETTAGE /HYSTEROSCOPY;  Surgeon: Annalee Genta, DO;  Location: [facility];  Service: Gynecology;  Laterality: N/A;  w/Polypectomy  . left breast cyst removal    . TONSILLECTOMY    . UMBILICAL HERNIA REPAIR N/A 09/19/2020   Procedure: OPEN UMBILICAL HERNIA REPAIR;  Surgeon: Dwan Bolt, MD;  Location: [facility];  Service: General;  Laterality: N/A;    There were no vitals filed for this visit.   Subjective Assessment - 02/06/21 1315    Subjective reports that the right anterior hip is hurting alittle more today, c/o tightness and some pain in the ishical area with sitting    Currently in Pain? Yes    Pain Score 4     Pain Location Hip    Pain Orientation Right;Anterior;Posterior    Pain Descriptors / Indicators Sore    Aggravating Factors  unsure  why the increased pain today                             [facility] Adult PT Treatment/Exercise - 02/06/21 0001      Knee/Hip Exercises: Supine   Bridges with [employer] 20 reps    Bridges with [employer] 20 reps    Other Supine Knee/Hip Exercises feet on ball K2C, trunk rotaiton, small bridge, isometric abs, seated ball b/n knees ER red tband hips      Moist Heat Therapy   Number Minutes Moist Heat 12 Minutes    Moist Heat Location Hip      Electrical Stimulation   Electrical Stimulation Location right hip    Electrical Stimulation Action IFC    Electrical Stimulation Parameters supine    Electrical Stimulation Goals Pain      Manual Therapy   Manual Therapy Soft tissue mobilization;Passive ROM;Manual Traction    Soft tissue mobilization with use of roller and vibration on the biltareal hip and LE mms    Passive ROM stretches of the HS, piriformis, ITB, hip flexor, calf and quad    Manual Traction straight leg distraction  PT Short Term Goals - 01/16/21 1355      PT SHORT TERM GOAL #1   Title independent with initial HEP    Status Achieved             PT Long Term Goals - 02/06/21 1408      PT LONG TERM GOAL #1   Title decrease pain 50%    Status On-going      PT LONG TERM GOAL #2   Title able to get up from sitting without pain    Status Partially Met      PT LONG TERM GOAL #3   Title go up and down stairs step over step    Status Partially Met      PT LONG TERM GOAL #4   Title return to class exercise without difficulty    Status Partially Met                 Plan - 02/06/21 1403    Clinical Impression Statement Having some right anterior lateral hip tightness and pain, reports also right ischial pain.  She is not any tighter but is more tender in these areas, we added a little more core work today and I also worked manually on the hips adding some distraction to see if we could alleviate this, her mms  are still very tight    PT Next Visit Plan continue to progress    Consulted and Agree with Plan of Care Patient           Patient will benefit from skilled therapeutic intervention in order to improve the following deficits and impairments:  Abnormal gait,Decreased range of motion,Difficulty walking,Increased muscle spasms,Pain,Improper body mechanics,Impaired flexibility,Decreased balance,Decreased strength  Visit Diagnosis: Pain in right hip  Pain in left hip  Cramp and spasm  Difficulty in walking, not elsewhere classified     Problem List Patient Active Problem List   Diagnosis Date Noted  . Chest pain 11/29/2014    Sumner Boast., PT 02/06/2021, 2:51 PM  [place]. [place], [place], [address] Phone: [phone]   Fax:  [phone]  Name: [place] Kim MRN: [id number] Date of Birth: 1941-06-24

## 2021-02-13 ENCOUNTER — Ambulatory Visit: Payer: Medicare Other | Admitting: Physical Therapy

## 2021-02-13 ENCOUNTER — Other Ambulatory Visit: Payer: Self-pay

## 2021-02-13 ENCOUNTER — Encounter: Payer: Self-pay | Admitting: Physical Therapy

## 2021-02-13 DIAGNOSIS — M25551 Pain in right hip: Secondary | ICD-10-CM

## 2021-02-13 DIAGNOSIS — M25552 Pain in left hip: Secondary | ICD-10-CM | POA: Diagnosis not present

## 2021-02-13 DIAGNOSIS — R262 Difficulty in walking, not elsewhere classified: Secondary | ICD-10-CM | POA: Diagnosis not present

## 2021-02-13 DIAGNOSIS — R252 Cramp and spasm: Secondary | ICD-10-CM

## 2021-02-13 NOTE — Therapy (Signed)
[place]. [place], [place], [address] Phone: [phone]   Fax:  [phone]  Physical Therapy Treatment  Patient Details  Name: [place] Kim MRN: [id number] Date of Birth: October 17, 1940 Referring Provider (PT): K. Sherryle Lis Date: 02/13/2021   PT End of Session - 02/13/21 1356    Visit Number 7    Date for PT Re-Evaluation 03/02/21    Authorization Type UHC Medicare    PT Start Time 1310    PT Stop Time 1401    PT Time Calculation (min) 51 min    Activity Tolerance Patient tolerated treatment well    Behavior During Therapy WFL for tasks assessed/performed           Past Medical History:  Diagnosis Date  . Anxiety    PAST HISTORY  . Arthritis    bilateral hands  . Depression    genetic gene  . Heart murmur    CHILDHOOD FUNCTIONAL HEART MURMUR  . Osteoporosis   . Umbilical hernia   . Vertigo     Past Surgical History:  Procedure Laterality Date  . APPENDECTOMY    . BACK SURGERY    . BREAST EXCISIONAL BIOPSY    . BUNIONECTOMY    . COLONOSCOPY    . HYSTEROSCOPY WITH D & C N/A 06/15/2014   Procedure: DILATATION AND CURETTAGE /HYSTEROSCOPY;  Surgeon: Annalee Genta, DO;  Location: [facility];  Service: Gynecology;  Laterality: N/A;  w/Polypectomy  . left breast cyst removal    . TONSILLECTOMY    . UMBILICAL HERNIA REPAIR N/A 09/19/2020   Procedure: OPEN UMBILICAL HERNIA REPAIR;  Surgeon: Dwan Bolt, MD;  Location: [facility];  Service: General;  Laterality: N/A;    There were no vitals filed for this visit.   Subjective Assessment - 02/13/21 1313    Subjective Patient reports that she has laterla hip pain mostly on the right, some bilateral anterior hip pain and reports that sitting on soft surfaces cause pain in the ischial tuberosity    Currently in Pain? Yes    Pain Score 5     Pain Location Hip    Pain Orientation Right;Left                              [facility] Adult PT Treatment/Exercise - 02/13/21 0001      Modalities   Modalities Iontophoresis      Iontophoresis   Type of Iontophoresis Dexamethasone    Location right GT area    Dose 9m    Time 4 hour patch      Manual Therapy   Manual Therapy Soft tissue mobilization;Passive ROM;Manual Traction    Soft tissue mobilization with use of roller and vibration on the biltareal hip and LE mms    Passive ROM stretches of the HS, piriformis, ITB, hip flexor, calf and quad    Manual Traction straight leg distraction                    PT Short Term Goals - 01/16/21 1355      PT SHORT TERM GOAL #1   Title independent with initial HEP    Status Achieved             PT Long Term Goals - 02/13/21 1357      PT LONG TERM GOAL #1   Title decrease pain 50%  Status On-going      PT LONG TERM GOAL #2   Title able to get up from sitting without pain    Status Partially Met      PT LONG TERM GOAL #3   Title go up and down stairs step over step    Status Partially Met                 Plan - 02/13/21 1356    Clinical Impression Statement she is very tight in the hip flexors, she is very tender in the right GT area, I added Ionto today    PT Next Visit Plan see if ionto helped    Consulted and Agree with Plan of Care Patient           Patient will benefit from skilled therapeutic intervention in order to improve the following deficits and impairments:  Abnormal gait,Decreased range of motion,Difficulty walking,Increased muscle spasms,Pain,Improper body mechanics,Impaired flexibility,Decreased balance,Decreased strength  Visit Diagnosis: Pain in right hip  Pain in left hip  Cramp and spasm  Difficulty in walking, not elsewhere classified     Problem List Patient Active Problem List   Diagnosis Date Noted  . Chest pain 11/29/2014    Sumner Boast., PT 02/13/2021, 1:58 PM  [place]. [place], [place], [address] Phone: [phone]   Fax:  [phone]  Name: [place] Kim MRN: [id number] Date of Birth: Jan 28, 1941

## 2021-02-20 ENCOUNTER — Ambulatory Visit: Payer: Medicare Other | Admitting: Physical Therapy

## 2021-02-20 ENCOUNTER — Other Ambulatory Visit: Payer: Self-pay

## 2021-02-20 ENCOUNTER — Encounter: Payer: Self-pay | Admitting: Physical Therapy

## 2021-02-20 DIAGNOSIS — R252 Cramp and spasm: Secondary | ICD-10-CM | POA: Diagnosis not present

## 2021-02-20 DIAGNOSIS — M25552 Pain in left hip: Secondary | ICD-10-CM | POA: Diagnosis not present

## 2021-02-20 DIAGNOSIS — M25551 Pain in right hip: Secondary | ICD-10-CM | POA: Diagnosis not present

## 2021-02-20 DIAGNOSIS — R262 Difficulty in walking, not elsewhere classified: Secondary | ICD-10-CM | POA: Diagnosis not present

## 2021-02-20 NOTE — Therapy (Signed)
[place]. [place], [place], [address] Phone: [phone]   Fax:  [phone]  Physical Therapy Treatment  Patient Details  Name: [place] Kim MRN: [id number] Date of Birth: 1941/08/19 Referring Provider (PT): K. Sherryle Lis Date: 02/20/2021   PT End of Session - 02/20/21 1346    Visit Number 8    Date for PT Re-Evaluation 03/02/21    Authorization Type UHC Medicare    PT Start Time 1309    PT Stop Time 1407    PT Time Calculation (min) 58 min    Activity Tolerance Patient tolerated treatment well    Behavior During Therapy WFL for tasks assessed/performed           Past Medical History:  Diagnosis Date  . Anxiety    PAST HISTORY  . Arthritis    bilateral hands  . Depression    genetic gene  . Heart murmur    CHILDHOOD FUNCTIONAL HEART MURMUR  . Osteoporosis   . Umbilical hernia   . Vertigo     Past Surgical History:  Procedure Laterality Date  . APPENDECTOMY    . BACK SURGERY    . BREAST EXCISIONAL BIOPSY    . BUNIONECTOMY    . COLONOSCOPY    . HYSTEROSCOPY WITH D & C N/A 06/15/2014   Procedure: DILATATION AND CURETTAGE /HYSTEROSCOPY;  Surgeon: Annalee Genta, DO;  Location: [facility];  Service: Gynecology;  Laterality: N/A;  w/Polypectomy  . left breast cyst removal    . TONSILLECTOMY    . UMBILICAL HERNIA REPAIR N/A 09/19/2020   Procedure: OPEN UMBILICAL HERNIA REPAIR;  Surgeon: Dwan Bolt, MD;  Location: [facility];  Service: General;  Laterality: N/A;    There were no vitals filed for this visit.   Subjective Assessment - 02/20/21 1345    Subjective The front of the hips are feeling better, a little more on the distal ITB    Currently in Pain? Yes    Pain Score 4     Pain Location Hip    Pain Orientation Right;Left    Pain Descriptors / Indicators Sore;Aching;Tightness    Pain Relieving Factors the treatment really helps                              [facility] Adult PT Treatment/Exercise - 02/20/21 0001      Moist Heat Therapy   Number Minutes Moist Heat 12 Minutes    Moist Heat Location Hip      Electrical Stimulation   Electrical Stimulation Location right hip    Electrical Stimulation Action IFC    Electrical Stimulation Parameters left sidelying    Electrical Stimulation Goals Pain      Iontophoresis   Type of Iontophoresis Dexamethasone    Location right GT area    Dose 38m    Time 4 hour patch      Manual Therapy   Manual Therapy Soft tissue mobilization;Passive ROM;Manual Traction    Soft tissue mobilization with use of roller and vibration on the biltareal hip and LE mms    Passive ROM stretches of the HS, piriformis, ITB, hip flexor, calf and quad    Manual Traction straight leg distraction                    PT Short Term Goals - 01/16/21 1355  PT SHORT TERM GOAL #1   Title independent with initial HEP    Status Achieved             PT Long Term Goals - 02/20/21 1349      PT LONG TERM GOAL #1   Title decrease pain 50%    Status On-going      PT LONG TERM GOAL #2   Title able to get up from sitting without pain    Status Partially Met      PT LONG TERM GOAL #3   Title go up and down stairs step over step    Status Partially Met                 Plan - 02/20/21 1347    Clinical Impression Statement Patient overall doing better, less pain overall still reporting issues with stairs, she reports that she gets great relief with the stretching, she is tight in the Hips and does a lot at the gym I have went over what she is doing and feel that she is doing well I did give a few pointers and critiques of form for her squats    PT Next Visit Plan continue may add some stabiulity exercises    Consulted and Agree with Plan of Care Patient           Patient will benefit from skilled therapeutic intervention in order to improve the following  deficits and impairments:  Abnormal gait,Decreased range of motion,Difficulty walking,Increased muscle spasms,Pain,Improper body mechanics,Impaired flexibility,Decreased balance,Decreased strength  Visit Diagnosis: Pain in right hip  Pain in left hip  Cramp and spasm  Difficulty in walking, not elsewhere classified     Problem List Patient Active Problem List   Diagnosis Date Noted  . Chest pain 11/29/2014    Sumner Boast., PT 02/20/2021, 2:04 PM  [place]. [place], [place], [address] Phone: [phone]   Fax:  [phone]  Name: [place] Kim MRN: [id number] Date of Birth: November 12, 1940

## 2021-02-26 DIAGNOSIS — M810 Age-related osteoporosis without current pathological fracture: Secondary | ICD-10-CM | POA: Diagnosis not present

## 2021-02-26 DIAGNOSIS — K219 Gastro-esophageal reflux disease without esophagitis: Secondary | ICD-10-CM | POA: Diagnosis not present

## 2021-02-27 ENCOUNTER — Encounter: Payer: Self-pay | Admitting: Physical Therapy

## 2021-02-27 ENCOUNTER — Ambulatory Visit: Payer: Medicare Other | Attending: Sports Medicine | Admitting: Physical Therapy

## 2021-02-27 ENCOUNTER — Other Ambulatory Visit: Payer: Self-pay

## 2021-02-27 DIAGNOSIS — M25551 Pain in right hip: Secondary | ICD-10-CM | POA: Diagnosis not present

## 2021-02-27 DIAGNOSIS — R262 Difficulty in walking, not elsewhere classified: Secondary | ICD-10-CM | POA: Diagnosis not present

## 2021-02-27 DIAGNOSIS — R252 Cramp and spasm: Secondary | ICD-10-CM

## 2021-02-27 DIAGNOSIS — M25552 Pain in left hip: Secondary | ICD-10-CM | POA: Insufficient documentation

## 2021-02-27 NOTE — Therapy (Signed)
[place]. [place], [place], [address] Phone: [phone]   Fax:  [phone]  Physical Therapy Treatment  Patient Details  Name: RYENNE LYNAM MRN: [id number] Date of Birth: 17-Apr-1941 Referring Provider (PT): K. Sherryle Lis Date: 02/27/2021   PT End of Session - 02/27/21 1434    Visit Number 9    Date for PT Re-Evaluation 03/02/21    Authorization Type U[employer] Medicare    PT Start Time 2841    PT Stop Time 1445    PT Time Calculation (min) 50 min    Activity Tolerance Patient tolerated treatment well    Behavior During Therapy WFL for tasks assessed/performed           Past Medical History:  Diagnosis Date  . Anxiety    PAST HISTORY  . Arthritis    bilateral hands  . Depression    genetic gene  . Heart murmur    CHILDHOOD FUNCTIONAL HEART MURMUR  . Osteoporosis   . Umbilical hernia   . Vertigo     Past Surgical History:  Procedure Laterality Date  . APPENDECTOMY    . BACK SURGERY    . BREAST EXCISIONAL BIOPSY    . BUNIONECTOMY    . COLONOSCOPY    . HYSTEROSCOPY WITH D & C N/A 06/15/2014   Procedure: DILATATION AND CURETTAGE /HYSTEROSCOPY;  Surgeon: Annalee Genta, DO;  Location: [facility];  Service: Gynecology;  Laterality: N/A;  w/Polypectomy  . left breast cyst removal    . TONSILLECTOMY    . UMBILICAL HERNIA REPAIR N/A 09/19/2020   Procedure: OPEN UMBILICAL HERNIA REPAIR;  Surgeon: Dwan Bolt, MD;  Location: [facility];  Service: General;  Laterality: N/A;    There were no vitals filed for this visit.   Subjective Assessment - 02/27/21 1431    Subjective I have some difficulty with stairs    Currently in Pain? Yes    Pain Score 3     Pain Location Back    Pain Orientation Lower    Pain Descriptors / Indicators Sore    Aggravating Factors  stairs                             [facility] Adult PT Treatment/Exercise - 02/27/21 0001       Knee/Hip Exercises: Supine   Bridges with [employer] 20 reps    Bridges with Clamshell 20 reps    Other Supine Knee/Hip Exercises feet on ball K2C, trunk rotaiton, small bridge, isometric abs, seated ball b/n knees ER red tband hips      Iontophoresis   Type of Iontophoresis Dexamethasone    Location right GT area    Dose 58m    Time 4 hour patch      Manual Therapy   Manual Therapy Soft tissue mobilization;Passive ROM;Manual Traction    Soft tissue mobilization STM to the glutes, ITB    Passive ROM stretches of the HS, piriformis, ITB, hip flexor, calf and quad                    PT Short Term Goals - 01/16/21 1355      PT SHORT TERM GOAL #1   Title independent with initial HEP    Status Achieved             PT Long Term Goals - 02/27/21 1440  PT LONG TERM GOAL #1   Title decrease pain 50%    Status On-going      PT LONG TERM GOAL #2   Title able to get up from sitting without pain    Status Achieved      PT LONG TERM GOAL #3   Title go up and down stairs step over step    Status Partially Met      PT LONG TERM GOAL #4   Title return to class exercise without difficulty    Status Partially Met                 Plan - 02/27/21 1435    Clinical Impression Statement Still very tender in the ITB and the piriformis area, she does c/o weakness with stairs.  Today is the 3rd ionto patch.  She reports tha tshe has been trying to do the stretches at home but hte passive helps much more than her doing it her self    PT Next Visit Plan continue may add some stabiulity exercises leg strength    Consulted and Agree with Plan of Care Patient           Patient will benefit from skilled therapeutic intervention in order to improve the following deficits and impairments:  Abnormal gait,Decreased range of motion,Difficulty walking,Increased muscle spasms,Pain,Improper body mechanics,Impaired flexibility,Decreased balance,Decreased strength  Visit  Diagnosis: Pain in right hip  Pain in left hip  Cramp and spasm  Difficulty in walking, not elsewhere classified     Problem List Patient Active Problem List   Diagnosis Date Noted  . Chest pain 11/29/2014    Sumner Boast., PT 02/27/2021, 2:43 PM  [place]. [place], [place], [address] Phone: [phone]   Fax:  [phone]  Name: CJ BEECHER MRN: [id number] Date of Birth: 31-May-1941

## 2021-03-01 DIAGNOSIS — M810 Age-related osteoporosis without current pathological fracture: Secondary | ICD-10-CM | POA: Diagnosis not present

## 2021-03-01 DIAGNOSIS — K219 Gastro-esophageal reflux disease without esophagitis: Secondary | ICD-10-CM | POA: Diagnosis not present

## 2021-03-05 DIAGNOSIS — N3281 Overactive bladder: Secondary | ICD-10-CM | POA: Diagnosis not present

## 2021-03-05 DIAGNOSIS — Z23 Encounter for immunization: Secondary | ICD-10-CM | POA: Diagnosis not present

## 2021-03-05 DIAGNOSIS — M810 Age-related osteoporosis without current pathological fracture: Secondary | ICD-10-CM | POA: Diagnosis not present

## 2021-03-05 DIAGNOSIS — R7303 Prediabetes: Secondary | ICD-10-CM | POA: Diagnosis not present

## 2021-03-05 DIAGNOSIS — L989 Disorder of the skin and subcutaneous tissue, unspecified: Secondary | ICD-10-CM | POA: Diagnosis not present

## 2021-03-05 DIAGNOSIS — N6325 Unspecified lump in the left breast, overlapping quadrants: Secondary | ICD-10-CM | POA: Diagnosis not present

## 2021-03-05 DIAGNOSIS — Z0000 Encounter for general adult medical examination without abnormal findings: Secondary | ICD-10-CM | POA: Diagnosis not present

## 2021-03-05 DIAGNOSIS — M2550 Pain in unspecified joint: Secondary | ICD-10-CM | POA: Diagnosis not present

## 2021-03-06 ENCOUNTER — Other Ambulatory Visit: Payer: Self-pay

## 2021-03-06 ENCOUNTER — Ambulatory Visit: Payer: Medicare Other | Admitting: Physical Therapy

## 2021-03-06 ENCOUNTER — Other Ambulatory Visit: Payer: Self-pay | Admitting: Family Medicine

## 2021-03-06 ENCOUNTER — Encounter: Payer: Self-pay | Admitting: Physical Therapy

## 2021-03-06 DIAGNOSIS — M25551 Pain in right hip: Secondary | ICD-10-CM

## 2021-03-06 DIAGNOSIS — R252 Cramp and spasm: Secondary | ICD-10-CM | POA: Diagnosis not present

## 2021-03-06 DIAGNOSIS — M25552 Pain in left hip: Secondary | ICD-10-CM

## 2021-03-06 DIAGNOSIS — R262 Difficulty in walking, not elsewhere classified: Secondary | ICD-10-CM

## 2021-03-06 DIAGNOSIS — M810 Age-related osteoporosis without current pathological fracture: Secondary | ICD-10-CM

## 2021-03-06 NOTE — Therapy (Signed)
[place]. [place], [place], [address] Phone: [phone]   Fax:  [phone] Progress Note Reporting Period 12/31/20 to 03/06/21 for the first 10 visits  See note below for Objective Data and Assessment of Progress/Goals.      Physical Therapy Treatment  Patient Details  Name: Lynn Kim MRN: [id number] Date of Birth: 11-27-1940 Referring Provider (PT): K. Sherryle Lis Date: 03/06/2021   PT End of Session - 03/06/21 1406    Visit Number 10    Date for PT Re-Evaluation 04/05/21    Authorization Type UHC Medicare    PT Start Time 6754    PT Stop Time 1414    PT Time Calculation (min) 61 min    Activity Tolerance Patient tolerated treatment well    Behavior During Therapy WFL for tasks assessed/performed           Past Medical History:  Diagnosis Date  . Anxiety    PAST HISTORY  . Arthritis    bilateral hands  . Depression    genetic gene  . Heart murmur    CHILDHOOD FUNCTIONAL HEART MURMUR  . Osteoporosis   . Umbilical hernia   . Vertigo     Past Surgical History:  Procedure Laterality Date  . APPENDECTOMY    . BACK SURGERY    . BREAST EXCISIONAL BIOPSY    . BUNIONECTOMY    . COLONOSCOPY    . HYSTEROSCOPY WITH D & C N/A 06/15/2014   Procedure: DILATATION AND CURETTAGE /HYSTEROSCOPY;  Surgeon: Annalee Genta, DO;  Location: [facility];  Service: Gynecology;  Laterality: N/A;  w/Polypectomy  . left breast cyst removal    . TONSILLECTOMY    . UMBILICAL HERNIA REPAIR N/A 09/19/2020   Procedure: OPEN UMBILICAL HERNIA REPAIR;  Surgeon: Dwan Bolt, MD;  Location: [facility];  Service: General;  Laterality: N/A;    There were no vitals filed for this visit.   Subjective Assessment - 03/06/21 1402    Subjective Patient reports that [place] the [place] and was able to go up and down stairs at the house and felt good about it, however she reports that sitting on a  soft couch seems to have really irritated her hips, she reports some increased pain after sitting and watching a movie    Currently in Pain? Yes    Pain Score 5     Pain Location Hip    Pain Orientation Right;Left    Pain Descriptors / Indicators Sore;Aching    Aggravating Factors  stairs, sitting on soft surface                             [facility] Adult PT Treatment/Exercise - 03/06/21 0001      Knee/Hip Exercises: Machines for Strengthening   Cybex Knee Extension 5# unilateral 3x5    Cybex Knee Flexion 15# unilateral 2x10    Cybex Leg Press 20# unilateral 3x5      Knee/Hip Exercises: Supine   Bridges Right;2 sets;10 reps    Bridges Limitations single leg    Bridges with Clamshell 20 reps    Other Supine Knee/Hip Exercises feet on ball K2C, trunk rotaiton, small bridge, isometric abs, seated ball b/n knees ER red tband hips      Iontophoresis   Type of Iontophoresis Dexamethasone    Location right GT area    Dose 75m  Time 4 hour patch      Manual Therapy   Manual Therapy Soft tissue mobilization;Passive ROM;Manual Traction    Soft tissue mobilization STM to the glutes, ITB    Passive ROM stretches of the HS, piriformis, ITB, hip flexor, calf and quad                    PT Short Term Goals - 01/16/21 1355      PT SHORT TERM GOAL #1   Title independent with initial HEP    Status Achieved             PT Long Term Goals - 03/06/21 1408      PT LONG TERM GOAL #1   Title decrease pain 50%    Status On-going      PT LONG TERM GOAL #2   Title able to get up from sitting without pain    Status Achieved      PT LONG TERM GOAL #3   Title go up and down stairs step over step    Status Partially Met      PT LONG TERM GOAL #4   Title return to class exercise without difficulty    Status Partially Met                 Plan - 03/06/21 1406    Clinical Impression Statement Patient had a lot of difficulty with single leg  activities especially on the right, very shaky and reported "feels really weak".  I recommended she try some single leg activties at the gym but watch for knee pain or back pain.  The hips were more tender today.  She has made good progress with her reporting no difficulty on stairs over the weekend and reports that she has returned some to the gym and is doing some classes without difficulty.    PT Frequency 1x / week    PT Duration 4 weeks    PT Treatment/Interventions ADLs/Self Care Home Management;Cryotherapy;Electrical Stimulation;Iontophoresis 72m/ml Dexamethasone;Moist Heat;Ultrasound;Therapeutic exercise;Therapeutic activities;Patient/family education;Manual techniques;Dry needling;Gait training;Stair training;Functional mobility training    PT Next Visit Plan work on single leg strength and core stability/LE flexibility    Consulted and Agree with Plan of Care Patient           Patient will benefit from skilled therapeutic intervention in order to improve the following deficits and impairments:  Abnormal gait,Decreased range of motion,Difficulty walking,Increased muscle spasms,Pain,Improper body mechanics,Impaired flexibility,Decreased balance,Decreased strength  Visit Diagnosis: Pain in right hip - Plan: PT plan of care cert/re-cert  Pain in left hip - Plan: PT plan of care cert/re-cert  Cramp and spasm - Plan: PT plan of care cert/re-cert  Difficulty in walking, not elsewhere classified - Plan: PT plan of care cert/re-cert     Problem List Patient Active Problem List   Diagnosis Date Noted  . Chest pain 11/29/2014    ASumner Boast, PT 03/06/2021, 2:10 PM  [place] [address]: [phone]  Fax:  [phone] Name: Lynn MANTHEIMRN: [id number] of Birth: 509-23-1942

## 2021-03-08 ENCOUNTER — Other Ambulatory Visit: Payer: Self-pay | Admitting: Family Medicine

## 2021-03-08 DIAGNOSIS — N63 Unspecified lump in unspecified breast: Secondary | ICD-10-CM

## 2021-03-13 ENCOUNTER — Other Ambulatory Visit: Payer: Self-pay

## 2021-03-13 ENCOUNTER — Ambulatory Visit: Payer: Medicare Other | Admitting: Physical Therapy

## 2021-03-13 ENCOUNTER — Other Ambulatory Visit: Payer: Self-pay | Admitting: Family Medicine

## 2021-03-13 ENCOUNTER — Encounter: Payer: Self-pay | Admitting: Physical Therapy

## 2021-03-13 DIAGNOSIS — R252 Cramp and spasm: Secondary | ICD-10-CM | POA: Diagnosis not present

## 2021-03-13 DIAGNOSIS — N63 Unspecified lump in unspecified breast: Secondary | ICD-10-CM

## 2021-03-13 DIAGNOSIS — R262 Difficulty in walking, not elsewhere classified: Secondary | ICD-10-CM | POA: Diagnosis not present

## 2021-03-13 DIAGNOSIS — M25552 Pain in left hip: Secondary | ICD-10-CM | POA: Diagnosis not present

## 2021-03-13 DIAGNOSIS — N6452 Nipple discharge: Secondary | ICD-10-CM

## 2021-03-13 DIAGNOSIS — M25551 Pain in right hip: Secondary | ICD-10-CM

## 2021-03-13 NOTE — Therapy (Signed)
[place]. [place], [place], [address] Phone: [phone]   Fax:  [phone]  Physical Therapy Treatment  Patient Details  Name: Lynn Kim MRN: [id number] Date of Birth: 06/05/1941 Referring Provider (PT): K. Sherryle Lis Date: 03/13/2021   PT End of Session - 03/13/21 1357     Visit Number 11    Date for PT Re-Evaluation 04/05/21    Authorization Type UHC Medicare    PT Start Time 5885    PT Stop Time 1403    PT Time Calculation (min) 50 min    Activity Tolerance Patient tolerated treatment well    Behavior During Therapy WFL for tasks assessed/performed             Past Medical History:  Diagnosis Date   Anxiety    PAST HISTORY   Arthritis    bilateral hands   Depression    genetic gene   Heart murmur    CHILDHOOD FUNCTIONAL HEART MURMUR   Osteoporosis    Umbilical hernia    Vertigo     Past Surgical History:  Procedure Laterality Date   APPENDECTOMY     BACK SURGERY     BREAST EXCISIONAL BIOPSY     BUNIONECTOMY     COLONOSCOPY     HYSTEROSCOPY WITH D & C N/A 06/15/2014   Procedure: DILATATION AND CURETTAGE /HYSTEROSCOPY;  Surgeon: Annalee Genta, DO;  Location: [facility];  Service: Gynecology;  Laterality: N/A;  w/Polypectomy   left breast cyst removal     TONSILLECTOMY     UMBILICAL HERNIA REPAIR N/A 09/19/2020   Procedure: OPEN UMBILICAL HERNIA REPAIR;  Surgeon: Dwan Bolt, MD;  Location: [facility];  Service: General;  Laterality: N/A;    There were no vitals filed for this visit.   Subjective Assessment - 03/13/21 1309     Subjective reports biggest issue is stepping up steps.    Currently in Pain? Yes    Pain Score 4     Pain Location Hip    Aggravating Factors  going up stairs                               [facility] Adult PT Treatment/Exercise - 03/13/21 0001       Knee/Hip Exercises: Standing   SLS SLS sit to stand  from elevated seating surface    Other Standing Knee Exercises 6" step ups      Knee/Hip Exercises: Supine   Bridges Right;2 sets;10 reps    Bridges Limitations single leg    Bridges with Clamshell 20 reps    Other Supine Knee/Hip Exercises feet on ball K2C, trunk rotaiton, small bridge, isometric abs, seated ball b/n knees ER red tband hips      Manual Therapy   Manual Therapy Soft tissue mobilization;Passive ROM;Manual Traction    Soft tissue mobilization STM to the glutes, ITB, hip flexor and vastus lateralis    Passive ROM stretches of the HS, piriformis, ITB, hip flexor, calf and quad                      PT Short Term Goals - 01/16/21 1355       PT SHORT TERM GOAL #1   Title independent with initial HEP    Status Achieved  PT Long Term Goals - 03/13/21 1358       PT LONG TERM GOAL #1   Title decrease pain 50%    Status Partially Met      PT LONG TERM GOAL #2   Title able to get up from sitting without pain    Status Achieved      PT LONG TERM GOAL #3   Title go up and down stairs step over step    Status Partially Met                   Plan - 03/13/21 1357     Clinical Impression Statement Patient reports sleeping better, reports less difficulty getting up from sitting, still having pain with going up stairs that are above 6" tall.  She has tenderness in the hip flexor, lateralis and ITB    PT Next Visit Plan work on single leg strength and core stability/LE flexibility    Consulted and Agree with Plan of Care Patient             Patient will benefit from skilled therapeutic intervention in order to improve the following deficits and impairments:  Abnormal gait, Decreased range of motion, Difficulty walking, Increased muscle spasms, Pain, Improper body mechanics, Impaired flexibility, Decreased balance, Decreased strength  Visit Diagnosis: Pain in right hip  Pain in left hip  Cramp and spasm  Difficulty in  walking, not elsewhere classified     Problem List Patient Active Problem List   Diagnosis Date Noted   Chest pain 11/29/2014    Sumner Boast., PT 03/13/2021, 1:59 PM  [place]. [place], [place], [address] Phone: [phone]   Fax:  [phone]  Name: Lynn Kim MRN: [id number] Date of Birth: 12/06/1940

## 2021-03-18 ENCOUNTER — Other Ambulatory Visit: Payer: Self-pay

## 2021-03-18 ENCOUNTER — Ambulatory Visit
Admission: RE | Admit: 2021-03-18 | Discharge: 2021-03-18 | Disposition: A | Discharge status: Home / Self Care | Payer: Medicare Other | Source: Ambulatory Visit | Attending: Family Medicine | Admitting: Family Medicine

## 2021-03-18 DIAGNOSIS — N63 Unspecified lump in unspecified breast: Secondary | ICD-10-CM

## 2021-03-18 DIAGNOSIS — R922 Inconclusive mammogram: Secondary | ICD-10-CM | POA: Diagnosis not present

## 2021-03-18 DIAGNOSIS — N6452 Nipple discharge: Secondary | ICD-10-CM

## 2021-03-20 ENCOUNTER — Ambulatory Visit: Payer: Medicare Other | Admitting: Physical Therapy

## 2021-03-27 ENCOUNTER — Other Ambulatory Visit: Payer: Self-pay

## 2021-03-27 ENCOUNTER — Ambulatory Visit: Payer: Medicare Other | Admitting: Physical Therapy

## 2021-03-27 ENCOUNTER — Encounter: Payer: Self-pay | Admitting: Physical Therapy

## 2021-03-27 DIAGNOSIS — M25552 Pain in left hip: Secondary | ICD-10-CM

## 2021-03-27 DIAGNOSIS — R252 Cramp and spasm: Secondary | ICD-10-CM | POA: Diagnosis not present

## 2021-03-27 DIAGNOSIS — M25551 Pain in right hip: Secondary | ICD-10-CM | POA: Diagnosis not present

## 2021-03-27 DIAGNOSIS — R262 Difficulty in walking, not elsewhere classified: Secondary | ICD-10-CM | POA: Diagnosis not present

## 2021-03-27 NOTE — Therapy (Signed)
[place]. [place], [place], [address] Phone: [phone]   Fax:  [phone]  Physical Therapy Treatment  Patient Details  Name: Lynn Kim MRN: [id number] Date of Birth: 05/18/1941 Referring Provider (PT): K. Sherryle Lis Date: 03/27/2021   PT End of Session - 03/27/21 1351     Visit Number 12    Date for PT Re-Evaluation 04/05/21    Authorization Type UHC Medicare    PT Start Time 4492    PT Stop Time 1404    PT Time Calculation (min) 51 min    Activity Tolerance Patient tolerated treatment well    Behavior During Therapy WFL for tasks assessed/performed             Past Medical History:  Diagnosis Date   Anxiety    PAST HISTORY   Arthritis    bilateral hands   Depression    genetic gene   Heart murmur    CHILDHOOD FUNCTIONAL HEART MURMUR   Osteoporosis    Umbilical hernia    Vertigo     Past Surgical History:  Procedure Laterality Date   APPENDECTOMY     BACK SURGERY     BREAST EXCISIONAL BIOPSY     BUNIONECTOMY     COLONOSCOPY     HYSTEROSCOPY WITH D & C N/A 06/15/2014   Procedure: DILATATION AND CURETTAGE /HYSTEROSCOPY;  Surgeon: Annalee Genta, DO;  Location: [facility];  Service: Gynecology;  Laterality: N/A;  w/Polypectomy   left breast cyst removal     TONSILLECTOMY     UMBILICAL HERNIA REPAIR N/A 09/19/2020   Procedure: OPEN UMBILICAL HERNIA REPAIR;  Surgeon: Dwan Bolt, MD;  Location: [facility];  Service: General;  Laterality: N/A;    There were no vitals filed for this visit.   Subjective Assessment - 03/27/21 1313     Subjective I think I am getting better, less pain with stairs and the sit to stand, I just feel weak at times    Currently in Pain? Yes    Pain Score 3     Pain Location Hip    Pain Orientation Right;Left    Pain Descriptors / Indicators Sore    Aggravating Factors  stairs                                [facility] Adult PT Treatment/Exercise - 03/27/21 0001       Knee/Hip Exercises: Seated   Sit to Sand 20 reps;without UE support   single leg sit to stand     Knee/Hip Exercises: Supine   Bridges Right;2 sets;10 reps    Bridges Limitations single leg    Bridges with Clams[employer] 20 reps      Manual Therapy   Manual Therapy Soft tissue mobilization;Passive ROM;Manual Traction    Soft tissue mobilization STM to the glutes, ITB, hip flexor and vastus lateralis, lumbar parapsinals and th eHS    Passive ROM stretches of the HS, piriformis, ITB, hip flexor, calf and quad    Manual Traction of the right LE                      PT Short Term Goals - 01/16/21 1355       PT SHORT TERM GOAL #1   Title independent with initial HEP    Status Achieved  PT Long Term Goals - 03/27/21 1354       PT LONG TERM GOAL #1   Title decrease pain 50%    Status Partially Met      PT LONG TERM GOAL #2   Title able to get up from sitting without pain    Status Achieved      PT LONG TERM GOAL #3   Title go up and down stairs step over step    Status Partially Met                   Plan - 03/27/21 1352     Clinical Impression Statement Patient reports overall doing better with less pain going from sit to stand and less difficulty with stairs, stairs still cause pain.  She reports some pain and cramping in the right LE at night.  She feels like some pain is coming from the back so I added STM to the lumbar paraspinals and some manual leg distractoion    PT Next Visit Plan work on single leg strength and core stability/LE flexibility    Consulted and Agree with Plan of Care Patient             Patient will benefit from skilled therapeutic intervention in order to improve the following deficits and impairments:  Abnormal gait, Decreased range of motion, Difficulty walking, Increased muscle spasms, Pain, Improper body  mechanics, Impaired flexibility, Decreased balance, Decreased strength  Visit Diagnosis: Pain in right hip  Pain in left hip  Cramp and spasm  Difficulty in walking, not elsewhere classified     Problem List Patient Active Problem List   Diagnosis Date Noted   Chest pain 11/29/2014    Sumner Boast., PT 03/27/2021, 1:55 PM  [place]. [place], [place], [address] Phone: [phone]   Fax:  [phone]  Name: Lynn Kim MRN: [id number] Date of Birth: 11/01/1940

## 2021-04-03 ENCOUNTER — Ambulatory Visit: Payer: Medicare Other | Attending: Sports Medicine | Admitting: Physical Therapy

## 2021-04-03 ENCOUNTER — Encounter: Payer: Self-pay | Admitting: Physical Therapy

## 2021-04-03 ENCOUNTER — Other Ambulatory Visit: Payer: Self-pay

## 2021-04-03 DIAGNOSIS — R252 Cramp and spasm: Secondary | ICD-10-CM | POA: Insufficient documentation

## 2021-04-03 DIAGNOSIS — M25552 Pain in left hip: Secondary | ICD-10-CM | POA: Insufficient documentation

## 2021-04-03 DIAGNOSIS — R262 Difficulty in walking, not elsewhere classified: Secondary | ICD-10-CM | POA: Insufficient documentation

## 2021-04-03 DIAGNOSIS — M25551 Pain in right hip: Secondary | ICD-10-CM | POA: Diagnosis not present

## 2021-04-03 NOTE — Therapy (Signed)
[place]. [place], [place], [address] Phone: [phone]   Fax:  [phone]  Physical Therapy Treatment  Patient Details  Name: Lynn Kim MRN: [id number] Date of Birth: [birth year] Referring Provider (PT): K. Sherryle Lis Date: 04/03/2021   PT End of Session - 04/03/21 1435     Visit Number 13    Date for PT Re-Evaluation 04/05/21    Authorization Type UHC Medicare    PT Start Time 4742    PT Stop Time 1445    PT Time Calculation (min) 52 min    Activity Tolerance Patient tolerated treatment well    Behavior During Therapy WFL for tasks assessed/performed             Past Medical History:  Diagnosis Date   Anxiety    PAST HISTORY   Arthritis    bilateral hands   Depression    genetic gene   Heart murmur    CHILDHOOD FUNCTIONAL HEART MURMUR   Osteoporosis    Umbilical hernia    Vertigo     Past Surgical History:  Procedure Laterality Date   APPENDECTOMY     BACK SURGERY     BREAST EXCISIONAL BIOPSY     BUNIONECTOMY     COLONOSCOPY     HYSTEROSCOPY WITH D & C N/A 06/15/2014   Procedure: DILATATION AND CURETTAGE /HYSTEROSCOPY;  Surgeon: Annalee Genta, DO;  Location: [facility];  Service: Gynecology;  Laterality: N/A;  w/Polypectomy   left breast cyst removal     TONSILLECTOMY     UMBILICAL HERNIA REPAIR N/A 09/19/2020   Procedure: OPEN UMBILICAL HERNIA REPAIR;  Surgeon: Dwan Bolt, MD;  Location: [facility];  Service: General;  Laterality: N/A;    There were no vitals filed for this visit.   Subjective Assessment - 04/03/21 1410     Subjective Still issues with the front of the thighs, notice it mostly with going up stairs, the sit to stand stuff is resolved    Currently in Pain? Yes    Pain Score 3     Pain Location Hip    Pain Orientation Right;Left;Anterior    Pain Descriptors / Indicators Sore;Tightness    Aggravating Factors  stairs                                [facility] Adult PT Treatment/Exercise - 04/03/21 0001       Knee/Hip Exercises: Supine   Bridges Right;2 sets;10 reps    Bridges Limitations single leg    Bridges with [employer] 20 reps    Other Supine Knee/Hip Exercises feet on ball K2C, trunk rotaiton, small bridge, isometric abs, seated ball b/n knees ER red tband hips      Manual Therapy   Manual Therapy Soft tissue mobilization;Passive ROM;Manual Traction    Soft tissue mobilization focus today on the bilateral thighs    Passive ROM stretches of the HS, piriformis, ITB, hip flexor, calf and quad    Manual Traction of the right LE                      PT Short Term Goals - 01/16/21 1355       PT SHORT TERM GOAL #1   Title independent with initial HEP    Status Achieved  PT Long Term Goals - 04/03/21 1438       PT LONG TERM GOAL #1   Title decrease pain 50%    Status Partially Met      PT LONG TERM GOAL #2   Title able to get up from sitting without pain    Status Achieved      PT LONG TERM GOAL #3   Title go up and down stairs step over step    Status Partially Met      PT LONG TERM GOAL #4   Title return to class exercise without difficulty    Status Achieved                   Plan - 04/03/21 1436     Clinical Impression Statement Stairs are really the only thing that is getting her functionall, she reports that she is still stiff and tight in the Le and the low back area.  Sit to stand is no issue for her now.  She reports that she will at times go up the stairs without issue and at other times she will really struggle and feel weak, MMT shows she is strong.  She is tender and sore with palpation in th eITB and the hip flexor mms    PT Next Visit Plan she will be on vaction next week.  Will look to write renewal if needed when she returns, she is going to a house with a lot of steps and she is worried about this    Consulted and Agree  with Plan of Care Patient             Patient will benefit from skilled therapeutic intervention in order to improve the following deficits and impairments:  Abnormal gait, Decreased range of motion, Difficulty walking, Increased muscle spasms, Pain, Improper body mechanics, Impaired flexibility, Decreased balance, Decreased strength  Visit Diagnosis: Pain in right hip  Pain in left hip  Cramp and spasm  Difficulty in walking, not elsewhere classified     Problem List Patient Active Problem List   Diagnosis Date Noted   Chest pain 11/29/2014    Sumner Boast., PT 04/03/2021, 2:39 PM  [place]. [place], [place], [address] Phone: [phone]   Fax:  [phone]  Name: Lynn Kim MRN: [id number] Date of Birth: May 10, 1941

## 2021-04-17 ENCOUNTER — Encounter: Payer: Self-pay | Admitting: Physical Therapy

## 2021-04-17 ENCOUNTER — Ambulatory Visit: Payer: Medicare Other | Admitting: Physical Therapy

## 2021-04-17 ENCOUNTER — Other Ambulatory Visit: Payer: Self-pay

## 2021-04-17 DIAGNOSIS — M25551 Pain in right hip: Secondary | ICD-10-CM | POA: Diagnosis not present

## 2021-04-17 DIAGNOSIS — R252 Cramp and spasm: Secondary | ICD-10-CM | POA: Diagnosis not present

## 2021-04-17 DIAGNOSIS — M25552 Pain in left hip: Secondary | ICD-10-CM

## 2021-04-17 DIAGNOSIS — R262 Difficulty in walking, not elsewhere classified: Secondary | ICD-10-CM | POA: Diagnosis not present

## 2021-04-17 NOTE — Therapy (Signed)
[facility] [place], [place], [address] Phone: [phone]   Fax:  [phone]  Physical Therapy Treatment  Patient Details  Name: Lynn Kim MRN: [id number] Date of Birth: 1941/09/20 Referring Provider (PT): K. Sherryle Lis Date: 04/17/2021   PT End of Session - 04/17/21 1353     Visit Number 14    Date for PT Re-Evaluation 05/18/21    Authorization Type UHC Medicare    PT Start Time 1310    PT Stop Time 1400    PT Time Calculation (min) 50 min    Activity Tolerance Patient tolerated treatment well    Behavior During Therapy WFL for tasks assessed/performed             Past Medical History:  Diagnosis Date   Anxiety    PAST HISTORY   Arthritis    bilateral hands   Depression    genetic gene   Heart murmur    CHILDHOOD FUNCTIONAL HEART MURMUR   Osteoporosis    Umbilical hernia    Vertigo     Past Surgical History:  Procedure Laterality Date   APPENDECTOMY     BACK SURGERY     BREAST EXCISIONAL BIOPSY     BUNIONECTOMY     COLONOSCOPY     HYSTEROSCOPY WITH D & C N/A 06/15/2014   Procedure: DILATATION AND CURETTAGE /HYSTEROSCOPY;  Surgeon: Annalee Genta, DO;  Location: [facility];  Service: Gynecology;  Laterality: N/A;  w/Polypectomy   left breast cyst removal     TONSILLECTOMY     UMBILICAL HERNIA REPAIR N/A 09/19/2020   Procedure: OPEN UMBILICAL HERNIA REPAIR;  Surgeon: Dwan Bolt, MD;  Location: [facility];  Service: General;  Laterality: N/A;    There were no vitals filed for this visit.   Subjective Assessment - 04/17/21 1351     Subjective Patient was on vacation and was at a beach house with three stories, she did a lot of stair which is tough on her, she reports doing better than she thought she would but having tightness in teh anterior thigh and hips and some pain    Currently in Pain? Yes    Pain Location Hip    Pain Orientation  Right;Left;Anterior    Aggravating Factors  stairs                               [facility] Adult PT Treatment/Exercise - 04/17/21 0001       Knee/Hip Exercises: Stretches   Passive Hamstring Stretch Both;4 reps;20 seconds    Hip Flexor Stretch Both;4 reps;20 seconds    ITB Stretch Both;4 reps;20 seconds    Piriformis Stretch Both;4 reps;20 seconds      Knee/Hip Exercises: Standing   SLS SLS sit to stand from elevated seating surface    Other Standing Knee Exercises 6" step ups      Knee/Hip Exercises: Supine   Bridges Right;2 sets;10 reps    Bridges Limitations single leg    Bridges with [employer] 20 reps    Other Supine Knee/Hip Exercises feet on ball K2C, trunk rotaiton, small bridge, isometric abs, seated ball b/n knees ER red tband hips      Manual Therapy   Manual Therapy Soft tissue mobilization;Passive ROM;Manual Traction    Soft tissue mobilization focus today on the bilateral thighs  PT Short Term Goals - 01/16/21 1355       PT SHORT TERM GOAL #1   Title independent with initial HEP    Status Achieved               PT Long Term Goals - 04/17/21 1355       PT LONG TERM GOAL #1   Title decrease pain 50%    Status Partially Met      PT LONG TERM GOAL #2   Title able to get up from sitting without pain    Status Achieved      PT LONG TERM GOAL #3   Title go up and down stairs step over step    Status Partially Met      PT LONG TERM GOAL #4   Title return to class exercise without difficulty    Status Achieved                   Plan - 04/17/21 1354     Clinical Impression Statement Doing well overall and was able to tolerate the stairs at the beach, this did bother her and was difficult, but she feels that she may need to do more to help her, we talked aobut his and how to do safely without overdoing it.  Tight ITB and hip flexors    PT Frequency 1x / week    PT Duration 4 weeks    PT  Treatment/Interventions ADLs/Self Care Home Management;Cryotherapy;Electrical Stimulation;Iontophoresis 72m/ml Dexamethasone;Moist Heat;Ultrasound;Therapeutic exercise;Therapeutic activities;Patient/family education;Manual techniques;Dry needling;Gait training;Stair training;Functional mobility training    PT Next Visit Plan may see over the next period to maximize her function and help with pain    Consulted and Agree with Plan of Care Patient             Patient will benefit from skilled therapeutic intervention in order to improve the following deficits and impairments:  Abnormal gait, Decreased range of motion, Difficulty walking, Increased muscle spasms, Pain, Improper body mechanics, Impaired flexibility, Decreased balance, Decreased strength  Visit Diagnosis: Pain in right hip - Plan: PT plan of care cert/re-cert  Pain in left hip - Plan: PT plan of care cert/re-cert  Cramp and spasm - Plan: PT plan of care cert/re-cert  Difficulty in walking, not elsewhere classified - Plan: PT plan of care cert/re-cert     Problem List Patient Active Problem List   Diagnosis Date Noted   Chest pain 11/29/2014    ASumner Boast, PT 04/17/2021, 1:57 PM  [place] [address]Phone: [phone]  Fax:  [phone] Name: Lynn GILKISONMRN: [id number] of Birth: 5November 01, 1942

## 2021-04-24 ENCOUNTER — Ambulatory Visit: Payer: Medicare Other | Admitting: Physical Therapy

## 2021-04-24 ENCOUNTER — Other Ambulatory Visit: Payer: Self-pay

## 2021-04-24 ENCOUNTER — Encounter: Payer: Self-pay | Admitting: Physical Therapy

## 2021-04-24 DIAGNOSIS — R252 Cramp and spasm: Secondary | ICD-10-CM

## 2021-04-24 DIAGNOSIS — R262 Difficulty in walking, not elsewhere classified: Secondary | ICD-10-CM | POA: Diagnosis not present

## 2021-04-24 DIAGNOSIS — M25551 Pain in right hip: Secondary | ICD-10-CM | POA: Diagnosis not present

## 2021-04-24 DIAGNOSIS — M25552 Pain in left hip: Secondary | ICD-10-CM

## 2021-04-24 NOTE — Therapy (Signed)
[place]. [place], [place], [address] Phone: [phone]   Fax:  [phone]  Physical Therapy Treatment  Patient Details  Name: Lynn Kim MRN: [id number] Date of Birth: [birth year] Referring Provider (PT): K. Sherryle Lis Date: 04/24/2021   PT End of Session - 04/24/21 1351     Visit Number 15    Date for PT Re-Evaluation 05/18/21    Authorization Type UHC Medicare    PT Start Time 1310    PT Stop Time 1400    PT Time Calculation (min) 50 min    Activity Tolerance Patient tolerated treatment well    Behavior During Therapy WFL for tasks assessed/performed             Past Medical History:  Diagnosis Date   Anxiety    PAST HISTORY   Arthritis    bilateral hands   Depression    genetic gene   Heart murmur    CHILDHOOD FUNCTIONAL HEART MURMUR   Osteoporosis    Umbilical hernia    Vertigo     Past Surgical History:  Procedure Laterality Date   APPENDECTOMY     BACK SURGERY     BREAST EXCISIONAL BIOPSY     BUNIONECTOMY     COLONOSCOPY     HYSTEROSCOPY WITH D & C N/A 06/15/2014   Procedure: DILATATION AND CURETTAGE /HYSTEROSCOPY;  Surgeon: Annalee Genta, DO;  Location: [facility];  Service: Gynecology;  Laterality: N/A;  w/Polypectomy   left breast cyst removal     TONSILLECTOMY     UMBILICAL HERNIA REPAIR N/A 09/19/2020   Procedure: OPEN UMBILICAL HERNIA REPAIR;  Surgeon: Dwan Bolt, MD;  Location: [facility];  Service: General;  Laterality: N/A;    There were no vitals filed for this visit.   Subjective Assessment - 04/24/21 1310     Subjective I had an issue while walking the other day with hip and back being stiff and having more movement that was not good in the pelvis area    Currently in Pain? Yes    Pain Score 2     Pain Location Hip    Pain Orientation Right;Left;Anterior    Aggravating Factors  stairs                                [facility] Adult PT Treatment/Exercise - 04/24/21 0001       Knee/Hip Exercises: Standing   Other Standing Knee Exercises back to wall weighted ball overhead lift, 2# W backs and corner stretches      Knee/Hip Exercises: Seated   Other Seated Knee/Hip Exercises some squatting and dead lifts and very small partial lunges focus on form    Sit to [place] 20 reps;without UE support      Manual Therapy   Manual Therapy Soft tissue mobilization;Passive ROM;Manual Traction    Soft tissue mobilization focus today on the bilateral thighs    Passive ROM stretches of the HS, piriformis, ITB, hip flexor, calf and quad                      PT Short Term Goals - 01/16/21 1355       PT SHORT TERM GOAL #1   Title independent with initial HEP    Status Achieved  PT Long Term Goals - 04/24/21 1352       PT LONG TERM GOAL #1   Title decrease pain 50%    Status Partially Met      PT LONG TERM GOAL #2   Title able to get up from sitting without pain    Status Achieved      PT LONG TERM GOAL #3   Title go up and down stairs step over step    Status Partially Met                   Plan - 04/24/21 1351     Clinical Impression Statement Still some difficulty with stairs, went over an exercise that in class she could do as an isometric, she liked this.  Sore and tight in the ITB today.  Reports no issues getting up from sitting now    PT Next Visit Plan may see over the next period to maximize her function and help with pain    Consulted and Agree with Plan of Care Patient             Patient will benefit from skilled therapeutic intervention in order to improve the following deficits and impairments:  Abnormal gait, Decreased range of motion, Difficulty walking, Increased muscle spasms, Pain, Improper body mechanics, Impaired flexibility, Decreased balance, Decreased strength  Visit Diagnosis: Pain in right  hip  Pain in left hip  Cramp and spasm  Difficulty in walking, not elsewhere classified     Problem List Patient Active Problem List   Diagnosis Date Noted   Chest pain 11/29/2014    Sumner Boast., PT 04/24/2021, 1:54 PM  [place]. [place], [place], [address] Phone: [phone]   Fax:  [phone]  Name: Lynn Kim MRN: [id number] Date of Birth: [birth year]

## 2021-05-08 ENCOUNTER — Encounter: Payer: Self-pay | Admitting: Physical Therapy

## 2021-05-08 ENCOUNTER — Ambulatory Visit: Payer: Medicare Other | Attending: Sports Medicine | Admitting: Physical Therapy

## 2021-05-08 ENCOUNTER — Other Ambulatory Visit: Payer: Self-pay

## 2021-05-08 DIAGNOSIS — M25551 Pain in right hip: Secondary | ICD-10-CM

## 2021-05-08 DIAGNOSIS — R252 Cramp and spasm: Secondary | ICD-10-CM | POA: Diagnosis not present

## 2021-05-08 DIAGNOSIS — M25552 Pain in left hip: Secondary | ICD-10-CM

## 2021-05-08 DIAGNOSIS — R262 Difficulty in walking, not elsewhere classified: Secondary | ICD-10-CM | POA: Diagnosis not present

## 2021-05-08 NOTE — Therapy (Signed)
[place]. [place], [place], [address] Phone: [phone]   Fax:  [phone]  Physical Therapy Treatment  Patient Details  Name: Lynn Kim MRN: [id number] Date of Birth: 03-06-1941 Referring Provider (PT): K. Sherryle Lis Date: 05/08/2021   PT End of Session - 05/08/21 1609     Visit Number 16    Date for PT Re-Evaluation 05/18/21    Authorization Type UHC Medicare    PT Start Time 1527    PT Stop Time 1614    PT Time Calculation (min) 47 min    Activity Tolerance Patient tolerated treatment well    Behavior During Therapy WFL for tasks assessed/performed             Past Medical History:  Diagnosis Date   Anxiety    PAST HISTORY   Arthritis    bilateral hands   Depression    genetic gene   Heart murmur    CHILDHOOD FUNCTIONAL HEART MURMUR   Osteoporosis    Umbilical hernia    Vertigo     Past Surgical History:  Procedure Laterality Date   APPENDECTOMY     BACK SURGERY     BREAST EXCISIONAL BIOPSY     BUNIONECTOMY     COLONOSCOPY     HYSTEROSCOPY WITH D & C N/A 06/15/2014   Procedure: DILATATION AND CURETTAGE /HYSTEROSCOPY;  Surgeon: Annalee Genta, DO;  Location: [facility];  Service: Gynecology;  Laterality: N/A;  w/Polypectomy   left breast cyst removal     TONSILLECTOMY     UMBILICAL HERNIA REPAIR N/A 09/19/2020   Procedure: OPEN UMBILICAL HERNIA REPAIR;  Surgeon: Dwan Bolt, MD;  Location: [facility];  Service: General;  Laterality: N/A;    There were no vitals filed for this visit.   Subjective Assessment - 05/08/21 1602     Subjective My back is stif, overall much better with less issues.  I think I would like to try to make next week my last visit if I feel good    Currently in Pain? Yes    Pain Score 2     Pain Location Back    Pain Orientation Lower    Pain Descriptors / Indicators Tightness;Sore    Aggravating Factors  I just get stiff                                [facility] Adult PT Treatment/Exercise - 05/08/21 0001       Knee/Hip Exercises: Stretches   Passive Hamstring Stretch Both;4 reps;20 seconds    Quad Stretch Both;4 reps;20 seconds    Hip Flexor Stretch Both;4 reps;20 seconds    ITB Stretch Both;4 reps;20 seconds    Piriformis Stretch Both;4 reps;20 seconds      Knee/Hip Exercises: Supine   Bridges Right;2 sets;10 reps    Bridges Limitations single leg    Bridges with [employer] 20 reps    Other Supine Knee/Hip Exercises feet on ball K2C, trunk rotaiton, small bridge, isometric abs, seated ball b/n knees ER red tband hips      Manual Therapy   Manual Therapy Soft tissue mobilization;Passive ROM;Manual Traction    Soft tissue mobilization worked on the lumbar and buttocks                      PT Short Term Goals - 01/16/21  [place] #1   Title independent with initial HEP    Status Achieved               PT Long Term Goals - 05/08/21 1611       PT LONG TERM GOAL #1   Title decrease pain 50%    Status Partially Met      PT LONG TERM GOAL #2   Title able to get up from sitting without pain      PT LONG TERM GOAL #3   Title go up and down stairs step over step    Status Achieved      PT LONG TERM GOAL #4   Title return to class exercise without difficulty    Status Achieved                   Plan - 05/08/21 1609     Clinical Impression Statement Reports doing great getting up and down from sitting, no issues. reports that tight in the low back.  She is able to do stairs step over step but still some difficulty the first few steps    PT Next Visit Plan possible D/C next visit    Consulted and Agree with Plan of Care Patient             Patient will benefit from skilled therapeutic intervention in order to improve the following deficits and impairments:  Abnormal gait, Decreased range of motion, Difficulty walking,  Increased muscle spasms, Pain, Improper body mechanics, Impaired flexibility, Decreased balance, Decreased strength  Visit Diagnosis: Pain in right hip  Pain in left hip  Cramp and spasm  Difficulty in walking, not elsewhere classified     Problem List Patient Active Problem List   Diagnosis Date Noted   Chest pain 11/29/2014    Sumner Boast., PT 05/08/2021, 4:12 PM  [place]. [place], [place], [address] Phone: [phone]   Fax:  [phone]  Name: Lynn Kim MRN: [id number] Date of Birth: 01-10-1941

## 2021-05-15 ENCOUNTER — Encounter: Payer: Self-pay | Admitting: Physical Therapy

## 2021-05-15 ENCOUNTER — Ambulatory Visit: Payer: Medicare Other | Admitting: Physical Therapy

## 2021-05-15 ENCOUNTER — Other Ambulatory Visit: Payer: Self-pay

## 2021-05-15 DIAGNOSIS — R252 Cramp and spasm: Secondary | ICD-10-CM

## 2021-05-15 DIAGNOSIS — R262 Difficulty in walking, not elsewhere classified: Secondary | ICD-10-CM

## 2021-05-15 DIAGNOSIS — M25551 Pain in right hip: Secondary | ICD-10-CM

## 2021-05-15 DIAGNOSIS — M25552 Pain in left hip: Secondary | ICD-10-CM | POA: Diagnosis not present

## 2021-05-15 NOTE — Therapy (Signed)
[place]. [place], [place], [address] Phone: [phone]   Fax:  [phone]  Physical Therapy Treatment  Patient Details  Name: Lynn Kim MRN: [id number] Date of Birth: [birth year] Referring Provider (PT): K. Sherryle Lis Date: 05/15/2021   PT End of Session - 05/15/21 1521     Visit Number 17    Date for PT Re-Evaluation 05/18/21    Authorization Type UHC Medicare    PT Start Time 1400    PT Stop Time 1450    PT Time Calculation (min) 50 min    Activity Tolerance Patient tolerated treatment well    Behavior During Therapy WFL for tasks assessed/performed             Past Medical History:  Diagnosis Date   Anxiety    PAST HISTORY   Arthritis    bilateral hands   Depression    genetic gene   Heart murmur    CHILDHOOD FUNCTIONAL HEART MURMUR   Osteoporosis    Umbilical hernia    Vertigo     Past Surgical History:  Procedure Laterality Date   APPENDECTOMY     BACK SURGERY     BREAST EXCISIONAL BIOPSY     BUNIONECTOMY     COLONOSCOPY     HYSTEROSCOPY WITH D & C N/A 06/15/2014   Procedure: DILATATION AND CURETTAGE /HYSTEROSCOPY;  Surgeon: Annalee Genta, DO;  Location: [facility];  Service: Gynecology;  Laterality: N/A;  w/Polypectomy   left breast cyst removal     TONSILLECTOMY     UMBILICAL HERNIA REPAIR N/A 09/19/2020   Procedure: OPEN UMBILICAL HERNIA REPAIR;  Surgeon: Dwan Bolt, MD;  Location: [facility];  Service: General;  Laterality: N/A;    There were no vitals filed for this visit.   Subjective Assessment - 05/15/21 1517     Subjective I am doing well, I have quesitons about exercises but stairs and getting up from sitting no longer hurt    Currently in Pain? Yes    Pain Score 1     Pain Location Back    Pain Relieving Factors the last time you really helped me                               [facility] Adult PT  Treatment/Exercise - 05/15/21 0001       Self-Care   Self-Care Other Self-Care Comments    Other Self-Care Comments  went over posture body mechanics and the HEP, to go slow and give the body rest      Knee/Hip Exercises: Stretches   Passive Hamstring Stretch Both;4 reps;20 seconds    Quad Stretch Both;4 reps;20 seconds    Hip Flexor Stretch Both;4 reps;20 seconds    ITB Stretch Both;4 reps;20 seconds    Piriformis Stretch Both;4 reps;20 seconds      Knee/Hip Exercises: Standing   Other Standing Knee Exercises back to wall weighted ball overhead lift, 2# W backs and corner stretches      Knee/Hip Exercises: Seated   Sit to [employer] 20 reps;without UE support      Knee/Hip Exercises: Supine   Bridges Right;2 sets;10 reps    Bridges Limitations single leg    Bridges with [employer] 20 reps    Other Supine Knee/Hip Exercises feet on ball K2C, trunk rotaiton, small bridge, isometric abs, seated ball b/n knees ER  red tband hips      Manual Therapy   Manual Therapy Soft tissue mobilization;Passive ROM;Manual Traction    Soft tissue mobilization worked on the lumbar and buttocks and quads                      PT Short Term Goals - 01/16/21 1355       PT SHORT TERM GOAL #1   Title independent with initial HEP    Status Achieved               PT Long Term Goals - 05/15/21 1523       PT LONG TERM GOAL #1   Title decrease pain 50%    Status Achieved      PT LONG TERM GOAL #2   Title able to get up from sitting without pain    Status Achieved      PT LONG TERM GOAL #3   Title go up and down stairs step over step    Status Achieved      PT LONG TERM GOAL #4   Title return to class exercise without difficulty    Status Achieved                   Plan - 05/15/21 1521     Clinical Impression Statement Patient doing very well, no issues with stairs or getting up from sitting, we discussed self care, posture body mechanics and HEP and advanced gym  activities.  She feels that she has a good idea of what to do and feels that she has no issues at this time    PT Next Visit Plan D/C goals met    Consulted and Agree with Plan of Care Patient             Patient will benefit from skilled therapeutic intervention in order to improve the following deficits and impairments:  Abnormal gait, Decreased range of motion, Difficulty walking, Increased muscle spasms, Pain, Improper body mechanics, Impaired flexibility, Decreased balance, Decreased strength  Visit Diagnosis: Pain in right hip  Pain in left hip  Cramp and spasm  Difficulty in walking, not elsewhere classified     Problem List Patient Active Problem List   Diagnosis Date Noted   Chest pain 11/29/2014    Sumner Boast., PT 05/15/2021, 3:30 PM  [name], [place], [address] Phone: [phone]   Fax:  [phone]  Name: Lynn Kim MRN: [id number] Date of Birth: 11/19/1940

## 2021-05-22 DIAGNOSIS — M810 Age-related osteoporosis without current pathological fracture: Secondary | ICD-10-CM | POA: Diagnosis not present

## 2021-05-22 DIAGNOSIS — K219 Gastro-esophageal reflux disease without esophagitis: Secondary | ICD-10-CM | POA: Diagnosis not present

## 2021-07-03 DIAGNOSIS — R799 Abnormal finding of blood chemistry, unspecified: Secondary | ICD-10-CM | POA: Diagnosis not present

## 2021-07-03 DIAGNOSIS — E039 Hypothyroidism, unspecified: Secondary | ICD-10-CM | POA: Diagnosis not present

## 2021-07-03 DIAGNOSIS — D519 Vitamin B12 deficiency anemia, unspecified: Secondary | ICD-10-CM | POA: Diagnosis not present

## 2021-07-03 DIAGNOSIS — E559 Vitamin D deficiency, unspecified: Secondary | ICD-10-CM | POA: Diagnosis not present

## 2021-07-09 ENCOUNTER — Other Ambulatory Visit: Payer: Self-pay | Admitting: Family Medicine

## 2021-07-09 DIAGNOSIS — Z1231 Encounter for screening mammogram for malignant neoplasm of breast: Secondary | ICD-10-CM

## 2021-07-11 DIAGNOSIS — Z23 Encounter for immunization: Secondary | ICD-10-CM | POA: Diagnosis not present

## 2021-07-29 ENCOUNTER — Ambulatory Visit
Admission: RE | Admit: 2021-07-29 | Discharge: 2021-07-29 | Disposition: A | Discharge status: Home / Self Care | Payer: Medicare Other | Source: Ambulatory Visit | Attending: Family Medicine | Admitting: Family Medicine

## 2021-07-29 ENCOUNTER — Other Ambulatory Visit: Payer: Self-pay

## 2021-07-29 DIAGNOSIS — M810 Age-related osteoporosis without current pathological fracture: Secondary | ICD-10-CM | POA: Diagnosis not present

## 2021-08-06 ENCOUNTER — Other Ambulatory Visit: Payer: Self-pay

## 2021-08-06 ENCOUNTER — Ambulatory Visit
Admission: RE | Admit: 2021-08-06 | Discharge: 2021-08-06 | Disposition: A | Discharge status: Home / Self Care | Payer: Medicare Other | Source: Ambulatory Visit | Attending: Family Medicine | Admitting: Family Medicine

## 2021-08-06 DIAGNOSIS — Z1231 Encounter for screening mammogram for malignant neoplasm of breast: Secondary | ICD-10-CM | POA: Diagnosis not present

## 2021-08-12 DIAGNOSIS — M810 Age-related osteoporosis without current pathological fracture: Secondary | ICD-10-CM | POA: Diagnosis not present

## 2021-08-12 DIAGNOSIS — R197 Diarrhea, unspecified: Secondary | ICD-10-CM | POA: Diagnosis not present

## 2021-10-25 DIAGNOSIS — L905 Scar conditions and fibrosis of skin: Secondary | ICD-10-CM | POA: Diagnosis not present

## 2021-11-07 ENCOUNTER — Other Ambulatory Visit: Payer: Self-pay

## 2021-11-07 ENCOUNTER — Encounter: Payer: Self-pay | Admitting: Physical Therapy

## 2021-11-07 ENCOUNTER — Ambulatory Visit: Payer: Medicare Other | Attending: Family Medicine | Admitting: Physical Therapy

## 2021-11-07 DIAGNOSIS — M6281 Muscle weakness (generalized): Secondary | ICD-10-CM | POA: Insufficient documentation

## 2021-11-07 DIAGNOSIS — M62838 Other muscle spasm: Secondary | ICD-10-CM | POA: Insufficient documentation

## 2021-11-07 DIAGNOSIS — L905 Scar conditions and fibrosis of skin: Secondary | ICD-10-CM | POA: Insufficient documentation

## 2021-11-07 DIAGNOSIS — R293 Abnormal posture: Secondary | ICD-10-CM | POA: Insufficient documentation

## 2021-11-07 DIAGNOSIS — Z8719 Personal history of other diseases of the digestive system: Secondary | ICD-10-CM | POA: Insufficient documentation

## 2021-11-07 NOTE — Therapy (Signed)
[facility] @ [facility] [address] [place], [place], [address] Phone: [phone]   Fax:  [phone]  Physical Therapy Evaluation  Patient Details  Name: Lynn Kim MRN: [id number] Date of Birth: [birth year] Referring Provider (PT): Carilyn Goodpasture, NP   Encounter Date: 11/07/2021   PT End of Session - 11/07/21 1133     Visit Number 1    Date for PT Re-Evaluation 12/19/21    Authorization Type UHC MCR    Progress Note Due on Visit 10    PT Start Time 1134    PT Stop Time 1212    PT Time Calculation (min) 38 min    Activity Tolerance Patient tolerated treatment well    Behavior During Therapy [facility] for tasks assessed/performed             Past Medical History:  Diagnosis Date   Anxiety    PAST HISTORY   Arthritis    bilateral hands   Depression    genetic gene   Heart murmur    CHILDHOOD FUNCTIONAL HEART MURMUR   Osteoporosis    Umbilical hernia    Vertigo     Past Surgical History:  Procedure Laterality Date   APPENDECTOMY     BACK SURGERY     BREAST EXCISIONAL BIOPSY     BUNIONECTOMY     COLONOSCOPY     HYSTEROSCOPY WITH D & C N/A 06/15/2014   Procedure: DILATATION AND CURETTAGE /HYSTEROSCOPY;  Surgeon: Sharon Seller, DO;  Location: [facility];  Service: Gynecology;  Laterality: N/A;  w/Polypectomy   left breast cyst removal     TONSILLECTOMY     UMBILICAL HERNIA REPAIR N/A 09/19/2020   Procedure: OPEN UMBILICAL HERNIA REPAIR;  Surgeon: Fritzi Mandes, MD;  Location: [facility];  Service: General;  Laterality: N/A;    There were no vitals filed for this visit.    Subjective Assessment - 11/07/21 1141     Subjective Pt reports she had small umbilical repair 08/2020 and now started experiencing "twinges" or "spams" of pain around this area about one month ago and have progressively gotten worse and wanted to have scar tissue "broken up" before it pain got bad.    How long can you sit  comfortably? no limits    How long can you stand comfortably? no limits    How long can you walk comfortably? no limits    Patient Stated Goals to not have scar tissue    Currently in Pain? Yes    Pain Score 1     Pain Location Abdomen    Pain Orientation Mid   right at naval area somewhat on right side   Pain Descriptors / Indicators Spasm    Pain Type Acute pain                [facility] PT Assessment - 11/07/21 0001       Assessment   Medical Diagnosis Z87.19 (ICD-10-CM) - Personal history of other diseases of the digestive system  L90.5 (ICD-10-CM) - Scar conditions and fibrosis of skin    Referring Provider (PT) Carilyn Goodpasture, NP    Onset Date/Surgical Date 11/07/21    Prior Therapy yes for scar tissue after previous surgery      Precautions   Precautions None      Restrictions   Weight Bearing Restrictions No      Balance Screen   Has the patient fallen in the past 6 months  Yes    How many times? 1- doing TRX with foot stuck in strap and lost balance to ground from on hands/knees and once again with strap but did not have any injuries or bruises    Has the patient had a decrease in activity level because of a fear of falling?  No    Is the patient reluctant to leave their home because of a fear of falling?  No      Home [employer] information [employer] residence    Living Arrangements Alone      Prior Function   Level of Independence Independent      Cognition   Overall Cognitive Status Within Functional Limits for tasks assessed      Sensation   Light Touch Appears Intact      Coordination   Gross Motor Movements are Fluid and Coordinated Yes    Fine Motor Movements are Fluid and Coordinated Yes      Posture/Postural Control   Posture/Postural Control Postural limitations    Postural Limitations Rounded Shoulders      ROM / Strength   AROM / PROM / Strength AROM;Strength      AROM   Overall AROM Comments thoracic and lumbar spine limited by  75% in bil side bending and 50% bil rotation, and 25% in flexion however 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      Flexibility   Soft Tissue Assessment /Muscle Length yes    Hamstrings bil tightness noted    Quadriceps bil tightness noted    ITB bil tightness noted    Piriformis bil tightness noted      Palpation   Palpation comment abdominal restrictions noted at umbilicus on Rt and lower side and fascial restrictions noted in upward,downward, Rt and Lt directions in these small areas. Pt reports she has no other complaints and no other restictions felt.                        Objective measurements completed on examination: See above findings.                PT Education - 11/07/21 1221     Education Details pt educated on tissue mobilization for home and PT demonstrated how to complete, pt returned good technique and denied pain.    Person(s) Educated Patient    Methods Explanation;Demonstration;Tactile cues;Verbal cues    Comprehension Returned demonstration;Verbalized understanding              PT Short Term Goals - 11/07/21 1230       PT SHORT TERM GOAL #1   Title pt to be I with HEP    Time 3    Period Weeks    Status New    Target Date 11/28/21               PT Long Term Goals - 11/07/21 1230       PT LONG TERM GOAL #1   Title pt to be I with advanced HEP    Time 6    Period Weeks    Status New    Target Date 12/19/21      PT LONG TERM GOAL #2   Title Pt to report no more than 0/10 pain and no "twinges" reported from abdominal scar site    Time 6    Period Weeks    Status New  Target Date 12/19/21      PT LONG TERM GOAL #3   Title pt to demonstrate no fascial restrictions in abdomen with manual assessment from PT    Time 6    Period Weeks    Status New    Target Date 12/19/21                    Plan - 11/07/21 1134     Clinical Impression Statement Pt is 81yo  female who presents to [facility] with referral for scar tissue mobility in abdomen s/p umbilical hernia repair in 08/2020. Pt reports over the past month she has has worsening "twinges" or "small spams" at umbilicus and sometimes just to the right of it. Pt has had success with scar tissue work from PT in the past and wanted to get started on this as soon as she felt some tightness to decrease impact later. Pt is very active, working out several days a week, stretches daily, and does TRX. 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 Pt only complaint at this time is small tightness and twinges felt at umbilicus. Pt found to have fascial restrictions noted in this area in all directions and localized to small area at Rt/lower side of umbilicus, no others in abdomen. Pt educated on how to complete tissue mobility at home between session and demonstrated good return of technique without pain. Pt motivated to improve and agreeable to return to treatments as needed. Pt would benefit from continued PT to further address deficits.    Personal Factors and Comorbidities Age;Time since onset of injury/illness/exacerbation;Comorbidity 1    Comorbidities umbilical hernia repair from 12/21; Harrington rods in spine per pt; osteopenia, osteoporosis, hearing loss with hearing aides, depression/anxiety, OA,    Stability/Clinical Decision Making Stable/Uncomplicated    Clinical Decision Making Low    Rehab Potential Good    PT Frequency 1x / week    PT Duration 6 weeks    PT Treatment/Interventions ADLs/Self Care Home Management;Aquatic Therapy;Functional mobility training;Therapeutic activities;Therapeutic exercise;Ultrasound;Cryotherapy;Moist Heat;Neuromuscular re-education;Manual techniques;Patient/family education;Scar mobilization;Passive range of motion;Dry needling;Energy conservation;Taping     PT Next Visit Plan manual at abdomen    PT Home Exercise Plan scar tissue mobility    Consulted and Agree with Plan of Care Patient             Patient will benefit from skilled therapeutic intervention in order to improve the following deficits and impairments:  Increased fascial restricitons, Increased muscle spasms, Improper body mechanics, Postural dysfunction, Decreased mobility, Impaired flexibility, Decreased scar mobility  Visit Diagnosis: Muscle weakness (generalized) - Plan: PT plan of care cert/re-cert  Other muscle spasm - Plan: PT plan of care cert/re-cert  Abnormal posture - Plan: PT plan of care cert/re-cert     Problem List Patient Active Problem List   Diagnosis Date Noted   Chest pain 11/29/2014    Otelia Sergeant, PT, DPT 11/07/2310:33 PM   [facility] Outpatient & Specialty Re[facility] @ [facility] [address] [place], [place], [address] Phone: [phone]   Fax:  [phone]  Name: LASHAYA KIENITZ MRN: [id number] Date of Birth: Jan 05, 1941

## 2021-11-15 DIAGNOSIS — H2513 Age-related nuclear cataract, bilateral: Secondary | ICD-10-CM | POA: Diagnosis not present

## 2021-11-15 DIAGNOSIS — H5213 Myopia, bilateral: Secondary | ICD-10-CM | POA: Diagnosis not present

## 2021-11-19 ENCOUNTER — Other Ambulatory Visit: Payer: Self-pay

## 2021-11-19 ENCOUNTER — Ambulatory Visit: Payer: Medicare Other | Admitting: Physical Therapy

## 2021-11-19 DIAGNOSIS — M62838 Other muscle spasm: Secondary | ICD-10-CM | POA: Diagnosis not present

## 2021-11-19 DIAGNOSIS — L905 Scar conditions and fibrosis of skin: Secondary | ICD-10-CM | POA: Diagnosis not present

## 2021-11-19 DIAGNOSIS — R293 Abnormal posture: Secondary | ICD-10-CM

## 2021-11-19 DIAGNOSIS — Z8719 Personal history of other diseases of the digestive system: Secondary | ICD-10-CM | POA: Diagnosis not present

## 2021-11-19 DIAGNOSIS — M6281 Muscle weakness (generalized): Secondary | ICD-10-CM

## 2021-11-19 NOTE — Therapy (Signed)
[facility] Out[facility]ty Re[facility] @ [facility] [address] [place], [place], [address] Phone: [phone]   Fax:  [phone]  Physical Therapy Treatment  Patient Details  Name: Lynn Kim MRN: [id number] Date of Birth: Feb 08, 1941 Referring Provider (PT): Carilyn Goodpasture, NP   Encounter Date: 11/19/2021   PT End of Session - 11/19/21 1510     Visit Number 2    Date for PT Re-Evaluation 12/19/21    Authorization Type UHC MCR    Progress Note Due on Visit 10    PT Start Time 1510    PT Stop Time 1549    PT Time Calculation (min) 39 min    Activity Tolerance Patient tolerated treatment well    Behavior During Therapy [facility] for tasks assessed/performed             Past Medical History:  Diagnosis Date   Anxiety    PAST HISTORY   Arthritis    bilateral hands   Depression    genetic gene   Heart murmur    CHILDHOOD FUNCTIONAL HEART MURMUR   Osteoporosis    Umbilical hernia    Vertigo     Past Surgical History:  Procedure Laterality Date   APPENDECTOMY     BACK SURGERY     BREAST EXCISIONAL BIOPSY     BUNIONECTOMY     COLONOSCOPY     HYSTEROSCOPY WITH D & C N/A 06/15/2014   Procedure: DILATATION AND CURETTAGE /HYSTEROSCOPY;  Surgeon: Sharon Seller, DO;  Location: [facility];  Service: Gynecology;  Laterality: N/A;  w/Polypectomy   left breast cyst removal     TONSILLECTOMY     UMBILICAL HERNIA REPAIR N/A 09/19/2020   Procedure: OPEN UMBILICAL HERNIA REPAIR;  Surgeon: Fritzi Mandes, MD;  Location: [facility];  Service: General;  Laterality: N/A;    There were no vitals filed for this visit.   Subjective Assessment - 11/19/21 1512     Subjective Pt reports she has been trying to do scar mobility at home but unsure if she notices a difference but does report she no longer has the spasms around the umbillicus    How long can you sit comfortably? no limits    How long can you stand comfortably? no limits    How long can  you walk comfortably? no limits    Patient Stated Goals to not have scar tissue    Currently in Pain? No/denies                               [facility] Adult PT Treatment/Exercise - 11/19/21 0001       Self-Care   Self-Care Other Self-Care Comments    Other Self-Care Comments  Pt educated on core activation and diaphragmatic breathing for improved abdominal mobility and scar mobility further educated on this session.      Manual Therapy   Manual Therapy Soft tissue mobilization;Myofascial release    Manual therapy comments Manual work completed at umbilicus in Lt, anterior and posterior directions and scar tissue mobilization completed at Lt side of abdomen with direct method completed. PT provided hands on manual work as well as aide of suction cup at Lt side of umbilicus for improved release. Pt reports she feels much better at end of session, also less fascial restrictions noted.  PT Short Term Goals - 11/07/21 1230       PT SHORT TERM GOAL #1   Title pt to be I with HEP    Time 3    Period Weeks    Status New    Target Date 11/28/21               PT Long Term Goals - 11/07/21 1230       PT LONG TERM GOAL #1   Title pt to be I with advanced HEP    Time 6    Period Weeks    Status New    Target Date 12/19/21      PT LONG TERM GOAL #2   Title Pt to report no more than 0/10 pain and no "twinges" reported from abdominal scar site    Time 6    Period Weeks    Status New    Target Date 12/19/21      PT LONG TERM GOAL #3   Title pt to demonstrate no fascial restrictions in abdomen with manual assessment from PT    Time 6    Period Weeks    Status New    Target Date 12/19/21                   Plan - 11/19/21 1552     Clinical Impression Statement Pt presents to clinic reporting she has had no pain or "twinges" or spasms since eval and pt has been doing scar mobility at home. Pt reports she still  feels somewhat tight when doing self scar mobility at home but is very active otherwise. Pt session focused on education for core activations and breathing mechanics for improved abdominal mobility and limit restrictions. Fascial restictions noted at Lt side of abdomen with manual work and treatment provided with focus for decreased tightness and restrictions, with pt responding well and reports she felt much better and "my tummy feels happy" at end of session. Pt would benefit from continued PT to further address deficits.    Personal Factors and Comorbidities Age;Time since onset of injury/illness/exacerbation;Comorbidity 1    Comorbidities umbilical hernia repair from 12/21; Harrington rods in spine per pt; osteopenia, osteoporosis, hearing loss with hearing aides, depression/anxiety, OA,    Stability/Clinical Decision Making Stable/Uncomplicated    Rehab Potential Good    PT Frequency 1x / week    PT Duration 6 weeks    PT Treatment/Interventions ADLs/Self Care Home Management;Aquatic Therapy;Functional mobility training;Therapeutic activities;Therapeutic exercise;Ultrasound;Cryotherapy;Moist Heat;Neuromuscular re-education;Manual techniques;Patient/family education;Scar mobilization;Passive range of motion;Dry needling;Energy conservation;Taping    PT Next Visit Plan manual at abdomen    PT Home Exercise Plan scar tissue mobility    Consulted and Agree with Plan of Care Patient             Patient will benefit from skilled therapeutic intervention in order to improve the following deficits and impairments:  Increased fascial restricitons, Increased muscle spasms, Improper body mechanics, Postural dysfunction, Decreased mobility, Impaired flexibility, Decreased scar mobility  Visit Diagnosis: Muscle weakness (generalized)  Abnormal posture  Other muscle spasm     Problem List Patient Active Problem List   Diagnosis Date Noted   Chest pain 11/29/2014    Otelia Sergeant, PT,  DPT 02/21/233:55 PM   [facility] Out[facility] Re[facility] [address] [place], [place], [address] Phone: [phone]   Fax:  [phone]  Name: Lynn Kim MRN: [id number] Date of Birth: [birth year]

## 2021-11-25 DIAGNOSIS — S29012A Strain of muscle and tendon of back wall of thorax, initial encounter: Secondary | ICD-10-CM | POA: Diagnosis not present

## 2021-11-28 ENCOUNTER — Ambulatory Visit
Admission: RE | Admit: 2021-11-28 | Discharge: 2021-11-28 | Disposition: A | Discharge status: Home / Self Care | Payer: Medicare Other | Source: Ambulatory Visit | Attending: Family Medicine | Admitting: Family Medicine

## 2021-11-28 ENCOUNTER — Ambulatory Visit: Payer: Medicare Other | Attending: Family Medicine | Admitting: Physical Therapy

## 2021-11-28 ENCOUNTER — Other Ambulatory Visit: Payer: Self-pay | Admitting: Family Medicine

## 2021-11-28 ENCOUNTER — Other Ambulatory Visit: Payer: Self-pay

## 2021-11-28 DIAGNOSIS — M546 Pain in thoracic spine: Secondary | ICD-10-CM

## 2021-11-28 DIAGNOSIS — R262 Difficulty in walking, not elsewhere classified: Secondary | ICD-10-CM | POA: Insufficient documentation

## 2021-11-28 DIAGNOSIS — M5137 Other intervertebral disc degeneration, lumbosacral region: Secondary | ICD-10-CM | POA: Diagnosis not present

## 2021-11-28 DIAGNOSIS — M6281 Muscle weakness (generalized): Secondary | ICD-10-CM | POA: Diagnosis not present

## 2021-11-28 DIAGNOSIS — M545 Low back pain, unspecified: Secondary | ICD-10-CM | POA: Insufficient documentation

## 2021-11-28 DIAGNOSIS — R293 Abnormal posture: Secondary | ICD-10-CM | POA: Diagnosis not present

## 2021-11-28 DIAGNOSIS — M62838 Other muscle spasm: Secondary | ICD-10-CM | POA: Insufficient documentation

## 2021-11-28 DIAGNOSIS — M4326 Fusion of spine, lumbar region: Secondary | ICD-10-CM | POA: Diagnosis not present

## 2021-11-28 DIAGNOSIS — M4120 Other idiopathic scoliosis, site unspecified: Secondary | ICD-10-CM

## 2021-11-28 DIAGNOSIS — M549 Dorsalgia, unspecified: Secondary | ICD-10-CM | POA: Diagnosis not present

## 2021-11-28 NOTE — Therapy (Signed)
[place] ?[facility] ?[address] ?[place], [place], [address] ?Phone: [phone]   Fax:  [phone] ? ?Physical Therapy Treatment ? ?Patient Details  ?Name: Lynn Kim ?MRN: [id number] ?Date of Birth: 07/27/1941 ?Referring Provider (PT): Carilyn Goodpasture, NP ? ? ?Encounter Date: 11/28/2021 ? ? PT End of Session - 11/28/21 1538   ? ? Visit Number 3   ? Date for PT Re-Evaluation 12/19/21   ? Authorization Type UHC MCR   ? Progress Note Due on Visit 10   ? PT Start Time 1530   ? PT Stop Time 1606   ? PT Time Calculation (min) 36 min   ? Activity Tolerance Patient tolerated treatment well   ? Behavior During Therapy [facility] for tasks assessed/performed   ? ?  ?  ? ?  ? ? ?Past Medical History:  ?Diagnosis Date  ? Anxiety   ? PAST HISTORY  ? Arthritis   ? bilateral hands  ? Depression   ? genetic gene  ? Heart murmur   ? CHILDHOOD FUNCTIONAL HEART MURMUR  ? Osteoporosis   ? Umbilical hernia   ? Vertigo   ? ? ?Past Surgical History:  ?Procedure Laterality Date  ? APPENDECTOMY    ? BACK SURGERY    ? BREAST EXCISIONAL BIOPSY    ? BUNIONECTOMY    ? COLONOSCOPY    ? HYSTEROSCOPY WITH D & C N/A 06/15/2014  ? Procedure: DILATATION AND CURETTAGE /HYSTEROSCOPY;  Surgeon: Sharon Seller, DO;  Location: [facility];  Service: Gynecology;  Laterality: N/A;  w/Polypectomy  ? left breast cyst removal    ? TONSILLECTOMY    ? UMBILICAL HERNIA REPAIR N/A 09/19/2020  ? Procedure: OPEN UMBILICAL HERNIA REPAIR;  Surgeon: Fritzi Mandes, MD;  Location: [facility];  Service: General;  Laterality: N/A;  ? ? ?There were no vitals filed for this visit. ? ? Subjective Assessment - 11/28/21 1539   ? ? Subjective Pt reports she is still having a little pain and feels tightness around scarring but overall getting better.   ? How long can you sit comfortably? no limits   ? How long can you stand comfortably? no limits   ? How long can you walk comfortably? no limits   ? Patient Stated Goals  to not have scar tissue   ? Currently in Pain? No/denies   ? ?  ?  ? ?  ? ? ? ? ? ? ? ? ? ? ? ? ? ? ? ? ? ? ? ? [facility] Adult PT Treatment/Exercise - 11/28/21 0001   ? ?  ? Manual Therapy  ? Manual Therapy Soft tissue mobilization;Myofascial release   ? Manual therapy comments Manual work completed at umbilicus in bil quadrants and in all directions and scar tissue mobilization completed at Lt side of abdomen with direct method completed. PT provided hands on manual work as well as aide of suction cup at all  sides of umbilicus for improved release. Pt reports she feels much better at end of session, also less fascial restrictions noted. Initially had tenderness reported with lower umbilicus mobility however relieved with gentle stretching.   ? ?  ?  ? ?  ? ? ? ? ? ? ? ? ? ? ? ? PT Short Term Goals - 11/07/21 1230   ? ?  ? PT SHORT TERM GOAL #1  ? Title pt to be I with HEP   ? Time 3   ?  Period Weeks   ? Status New   ? Target Date 11/28/21   ? ?  ?  ? ?  ? ? ? ? PT Long Term Goals - 11/07/21 1230   ? ?  ? PT LONG TERM GOAL #1  ? Title pt to be I with advanced HEP   ? Time 6   ? Period Weeks   ? Status New   ? Target Date 12/19/21   ?  ? PT LONG TERM GOAL #2  ? Title Pt to report no more than 0/10 pain and no "twinges" reported from abdominal scar site   ? Time 6   ? Period Weeks   ? Status New   ? Target Date 12/19/21   ?  ? PT LONG TERM GOAL #3  ? Title pt to demonstrate no fascial restrictions in abdomen with manual assessment from PT   ? Time 6   ? Period Weeks   ? Status New   ? Target Date 12/19/21   ? ?  ?  ? ?  ? ? ? ? ? ? ? ? Plan - 11/28/21 1610   ? ? Clinical Impression Statement Pt presents to clinic reporting she had one instance of twinge feeling since last visit and felt some tightness at abdomen and would like one more session to see if this eases, in addition to this one. Pt session focused in manual work for decreased scar tissue, abdominal restrictions and tightness. Pt tolerated well and reported  initially this was uncomfortable but felt better at end of session and feels this is helpful for and pleased with progress so far. Pt would benefit from continued PT to further address deficits.   ? Personal Factors and Comorbidities Age;Time since onset of injury/illness/exacerbation;Comorbidity 1   ? 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 Stability/Clinical Decision Making Stable/Uncomplicated   ? Rehab Potential Good   ? PT Frequency 1x / week   ? PT Duration 6 weeks   ? PT Treatment/Interventions ADLs/Self Care Home Management;Aquatic Therapy;Functional mobility training;Therapeutic activities;Therapeutic exercise;Ultrasound;Cryotherapy;Moist Heat;Neuromuscular re-education;Manual techniques;Patient/family education;Scar mobilization;Passive range of motion;Dry needling;Energy conservation;Taping   ? PT Next Visit Plan manual at abdomen   ? PT Home Exercise Plan scar tissue mobility   ? Consulted and Agree with Plan of Care Patient   ? ?  ?  ? ?  ? ? ?Patient will benefit from skilled therapeutic intervention in order to improve the following deficits and impairments:  Increased fascial restricitons, Increased muscle spasms, Improper body mechanics, Postural dysfunction, Decreased mobility, Impaired flexibility, Decreased scar mobility ? ?Visit Diagnosis: ?Muscle weakness (generalized) ? ?Abnormal posture ? ?Other muscle spasm ? ? ? ? ?Problem List ?Patient Active Problem List  ? Diagnosis Date Noted  ? Chest pain 11/29/2014  ? ? ?Otelia Sergeant, PT, DPT ?03/02/234:12 PM ? ? ?[place] ?[facility] ?[address] ?[place], [place], [address] ?Phone: [phone]   Fax:  [phone] ? ?Name: Lynn Kim ?MRN: [id number] ?Date of Birth: 1941-09-11 ? ? ? ?

## 2021-12-04 ENCOUNTER — Ambulatory Visit: Payer: Medicare Other | Admitting: Physical Therapy

## 2021-12-04 ENCOUNTER — Other Ambulatory Visit: Payer: Self-pay

## 2021-12-04 DIAGNOSIS — R262 Difficulty in walking, not elsewhere classified: Secondary | ICD-10-CM | POA: Diagnosis not present

## 2021-12-04 DIAGNOSIS — R293 Abnormal posture: Secondary | ICD-10-CM

## 2021-12-04 DIAGNOSIS — M62838 Other muscle spasm: Secondary | ICD-10-CM | POA: Diagnosis not present

## 2021-12-04 DIAGNOSIS — M545 Low back pain, unspecified: Secondary | ICD-10-CM | POA: Diagnosis not present

## 2021-12-04 DIAGNOSIS — M6281 Muscle weakness (generalized): Secondary | ICD-10-CM | POA: Diagnosis not present

## 2021-12-04 NOTE — Therapy (Signed)
[place] ?[facility] ?[address] ?[place], [place], [address] ?Phone: [phone]   Fax:  [phone] ? ?Physical Therapy Treatment ? ?Patient Details  ?Name: Lynn Kim ?MRN: [id number] ?Date of Birth: Nov 19, 1940 ?Referring Provider (PT): Carilyn Goodpasture, NP ? ? ?Encounter Date: 12/04/2021 ? ? PT End of Session - 12/04/21 1601   ? ? Visit Number 4   ? Date for PT Re-Evaluation 12/19/21   ? Authorization Type UHC MCR   ? Progress Note Due on Visit 10   ? PT Start Time [phone]   ? PT Stop Time 1005   ? PT Time Calculation (min) 34 min   ? Activity Tolerance Patient tolerated treatment well   ? Behavior During Therapy [facility] for tasks assessed/performed   ? ?  ?  ? ?  ? ? ?Past Medical History:  ?Diagnosis Date  ? Anxiety   ? PAST HISTORY  ? Arthritis   ? bilateral hands  ? Depression   ? genetic gene  ? Heart murmur   ? CHILDHOOD FUNCTIONAL HEART MURMUR  ? Osteoporosis   ? Umbilical hernia   ? Vertigo   ? ? ?Past Surgical History:  ?Procedure Laterality Date  ? APPENDECTOMY    ? BACK SURGERY    ? BREAST EXCISIONAL BIOPSY    ? BUNIONECTOMY    ? COLONOSCOPY    ? HYSTEROSCOPY WITH D & C N/A 06/15/2014  ? Procedure: DILATATION AND CURETTAGE /HYSTEROSCOPY;  Surgeon: Sharon Seller, DO;  Location: [facility];  Service: Gynecology;  Laterality: N/A;  w/Polypectomy  ? left breast cyst removal    ? TONSILLECTOMY    ? UMBILICAL HERNIA REPAIR N/A 09/19/2020  ? Procedure: OPEN UMBILICAL HERNIA REPAIR;  Surgeon: Fritzi Mandes, MD;  Location: [facility];  Service: General;  Laterality: N/A;  ? ? ?There were no vitals filed for this visit. ? ? Subjective Assessment - 12/04/21 1006   ? ? Subjective Pt reports she is feeling much better but does notices just a little bit of pain at Lt umbilicus and under with tightness noted.   ? How long can you sit comfortably? no limits   ? How long can you stand comfortably? no limits   ? How long can you walk comfortably? no limits   ?  Patient Stated Goals to not have scar tissue   ? Currently in Pain? No/denies   ? ?  ?  ? ?  ? ? ? ? ? ? ? ? ? ? ? ? ? ? ? ? ? ? ? ? [facility] Adult PT Treatment/Exercise - 12/04/21 0001   ? ?  ? Manual Therapy  ? Manual Therapy Soft tissue mobilization;Myofascial release   ? Manual therapy comments Manual work completed at umbilicus in bil quadrants and in all directions and scar tissue mobilization completed at Lt side of abdomen with direct method completed. PT provided hands on manual work as well as aide of suction cup at Lt and Rt sides of umbilicus for improved release. Pt did have mild tenderness at Lt lower abdominal quadrant but improved with session and gentle stretching provided at end of session in bil directions at mid quadrants for improved mobility throughout. Pt conitnues to be educated on how to complete release at home   ? ?  ?  ? ?  ? ? ? ? ? ? ? ? ? ? ? ? PT Short Term Goals - 11/07/21 1230   ? ?  ?  PT SHORT TERM GOAL #1  ? Title pt to be I with HEP   ? Time 3   ? Period Weeks   ? Status New   ? Target Date 11/28/21   ? ?  ?  ? ?  ? ? ? ? PT Long Term Goals - 11/07/21 1230   ? ?  ? PT LONG TERM GOAL #1  ? Title pt to be I with advanced HEP   ? Time 6   ? Period Weeks   ? Status New   ? Target Date 12/19/21   ?  ? PT LONG TERM GOAL #2  ? Title Pt to report no more than 0/10 pain and no "twinges" reported from abdominal scar site   ? Time 6   ? Period Weeks   ? Status New   ? Target Date 12/19/21   ?  ? PT LONG TERM GOAL #3  ? Title pt to demonstrate no fascial restrictions in abdomen with manual assessment from PT   ? Time 6   ? Period Weeks   ? Status New   ? Target Date 12/19/21   ? ?  ?  ? ?  ? ? ? ? ? ? ? ? Plan - 12/04/21 1007   ? ? Clinical Impression Statement Pt presents to clinic reporting very mild tightness or twinge noted since last visit and pleased with this but would like to attempt to completely resolve it as she is so mobile and has had good sucess with release in the past, pt reports  she feels overall it has gotten much better since starting PT. Pt session focused in manual work for decreased scar tissue, abdominal restrictions and tightness. Pt is very active at home with acitivty classes and does stretching and continues to be complaint with stretching, scar mobility, and TA activiations and would like sessions to be more manual based. Pt tolerated well and reports she feels better and "looser" at end of treatment. Pt states this is helpful for and pleased with progress so far. Pt would benefit from continued PT to further address deficits.   ? Personal Factors and Comorbidities Age;Time since onset of injury/illness/exacerbation;Comorbidity 1   ? 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 PT Treatment/Interventions ADLs/Self Care Home Management;Aquatic Therapy;Functional mobility training;Therapeutic activities;Therapeutic exercise;Ultrasound;Cryotherapy;Moist Heat;Neuromuscular re-education;Manual techniques;Patient/family education;Scar mobilization;Passive range of motion;Dry needling;Energy conservation;Taping   ? PT Next Visit Plan manual at abdomen   ? PT Home Exercise Plan scar tissue mobility   ? Consulted and Agree with Plan of Care Patient   ? ?  ?  ? ?  ? ? ?Patient will benefit from skilled therapeutic intervention in order to improve the following deficits and impairments:  Increased fascial restricitons, Increased muscle spasms, Improper body mechanics, Postural dysfunction, Decreased mobility, Impaired flexibility, Decreased scar mobility ? ?Visit Diagnosis: ?Other muscle spasm ? ?Abnormal posture ? ? ? ? ?Problem List ?Patient Active Problem List  ? Diagnosis Date Noted  ? Chest pain 11/29/2014  ? ?Otelia Sergeant, PT, DPT ?12/04/2308:11 AM  ? ?[place] ?[facility] ?[address] ?[place], [place],  [address] ?Phone: [phone]   Fax:  [phone] ? ?Name: Lynn Kim ?MRN: [id number] ?Date of Birth: 08/21/1941 ? ? ? ?

## 2021-12-09 ENCOUNTER — Other Ambulatory Visit: Payer: Self-pay

## 2021-12-09 ENCOUNTER — Ambulatory Visit: Payer: Medicare Other | Admitting: Physical Therapy

## 2021-12-09 DIAGNOSIS — R293 Abnormal posture: Secondary | ICD-10-CM

## 2021-12-09 DIAGNOSIS — M545 Low back pain, unspecified: Secondary | ICD-10-CM | POA: Diagnosis not present

## 2021-12-09 DIAGNOSIS — R262 Difficulty in walking, not elsewhere classified: Secondary | ICD-10-CM | POA: Diagnosis not present

## 2021-12-09 DIAGNOSIS — M62838 Other muscle spasm: Secondary | ICD-10-CM | POA: Diagnosis not present

## 2021-12-09 DIAGNOSIS — M6281 Muscle weakness (generalized): Secondary | ICD-10-CM | POA: Diagnosis not present

## 2021-12-09 NOTE — Therapy (Signed)
[place] ?[facility] ?[address] ?[place], [place], [address] ?Phone: [phone]   Fax:  [phone] ? ?Physical Therapy Treatment ? ?Patient Details  ?Name: Lynn Kim ?MRN: [id number] ?Date of Birth: [birth year] ?Referring Provider (PT): Carilyn Goodpasture, NP ? ? ?Encounter Date: 12/09/2021 ? ? PT End of Session - 12/09/21 0935   ? ? Visit Number 5   ? Date for PT Re-Evaluation 12/19/21   ? Authorization Type UHC MCR   ? Progress Note Due on Visit 10   ? PT Start Time [id number]   ? PT Stop Time 1010   ? PT Time Calculation (min) 39 min   ? Activity Tolerance Patient tolerated treatment well   ? Behavior During Therapy [facility] for tasks assessed/performed   ? ?  ?  ? ?  ? ? ?Past Medical History:  ?Diagnosis Date  ? Anxiety   ? PAST HISTORY  ? Arthritis   ? bilateral hands  ? Depression   ? genetic gene  ? Heart murmur   ? CHILDHOOD FUNCTIONAL HEART MURMUR  ? Osteoporosis   ? Umbilical hernia   ? Vertigo   ? ? ?Past Surgical History:  ?Procedure Laterality Date  ? APPENDECTOMY    ? BACK SURGERY    ? BREAST EXCISIONAL BIOPSY    ? BUNIONECTOMY    ? COLONOSCOPY    ? HYSTEROSCOPY WITH D & C N/A 06/15/2014  ? Procedure: DILATATION AND CURETTAGE /HYSTEROSCOPY;  Surgeon: Sharon Seller, DO;  Location: [facility];  Service: Gynecology;  Laterality: N/A;  w/Polypectomy  ? left breast cyst removal    ? TONSILLECTOMY    ? UMBILICAL HERNIA REPAIR N/A 09/19/2020  ? Procedure: OPEN UMBILICAL HERNIA REPAIR;  Surgeon: Fritzi Mandes, MD;  Location: [facility];  Service: General;  Laterality: N/A;  ? ? ?There were no vitals filed for this visit. ? ? Subjective Assessment - 12/09/21 0935   ? ? Subjective Pt reports she continues to feel better but did have one instance of the twinge feeling a few nights ago but other than this has not had pain.   ? How long can you sit comfortably? no limits   ? How long can you stand comfortably? no limits   ? How long can you walk comfortably?  no limits   ? Patient Stated Goals to not have scar tissue   ? Currently in Pain? No/denies   ? ?  ?  ? ?  ? ? ? ? ? ? ? ? ? ? ? ? ? ? ? ? ? ? ? ? [facility] Adult PT Treatment/Exercise - 12/09/21 0001   ? ?  ? Manual Therapy  ? Manual Therapy Soft tissue mobilization;Myofascial release   ? Manual therapy comments Manual work completed at umbilicus in bil quadrants and in all directions and scar tissue mobilization completed at Lt side of abdomen with direct method completed. PT provided hands on manual work as well as aide of suction cup at Lt and Rt sides of umbilicus for improved release. Pt denied pain but did have small amount of discomfort with use suction cup however no tenderness with hands on manual work or after treatment.  Pt continues to be educated on how to complete release at home, reports she does do this and is having less and less pain.   ? ?  ?  ? ?  ? ? ? ? ? ? ? ? ? ? ? ?  PT Short Term Goals - 11/07/21 1230   ? ?  ? PT SHORT TERM GOAL #1  ? Title pt to be I with HEP   ? Time 3   ? Period Weeks   ? Status New   ? Target Date 11/28/21   ? ?  ?  ? ?  ? ? ? ? PT Long Term Goals - 11/07/21 1230   ? ?  ? PT LONG TERM GOAL #1  ? Title pt to be I with advanced HEP   ? Time 6   ? Period Weeks   ? Status New   ? Target Date 12/19/21   ?  ? PT LONG TERM GOAL #2  ? Title Pt to report no more than 0/10 pain and no "twinges" reported from abdominal scar site   ? Time 6   ? Period Weeks   ? Status New   ? Target Date 12/19/21   ?  ? PT LONG TERM GOAL #3  ? Title pt to demonstrate no fascial restrictions in abdomen with manual assessment from PT   ? Time 6   ? Period Weeks   ? Status New   ? Target Date 12/19/21   ? ?  ?  ? ?  ? ? ? ? ? ? ? ? Plan - 12/09/21 1019   ? ? Clinical Impression Statement Pt presents to clinic reporting she continues to have improvement and only had one instances of twinge at scar site since last session and it was 2 nights ago so she thinks relief is lasting longer between sessions and she  does continue to do home scar mobility as well. Pt session focused on manual work at abdomen, pt does a lot of stretching and activity outside of PT sessions and requests to complete manual here as she is so active outside of this. Pt tolerated manual work well without pain, did have brief discomfort wait scar mobility at umbilicus with use of suction cup only and none after treatment. Pt reports she thinks this is helping a lot and feels better since coming to PT and pleased with progress.   ? Personal Factors and Comorbidities Age;Time since onset of injury/illness/exacerbation;Comorbidity 1   ? 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 PT Treatment/Interventions ADLs/Self Care Home Management;Aquatic Therapy;Functional mobility training;Therapeutic activities;Therapeutic exercise;Ultrasound;Cryotherapy;Moist Heat;Neuromuscular re-education;Manual techniques;Patient/family education;Scar mobilization;Passive range of motion;Dry needling;Energy conservation;Taping   ? PT Next Visit Plan manual at abdomen   ? PT Home Exercise Plan scar tissue mobility   ? Consulted and Agree with Plan of Care Patient   ? ?  ?  ? ?  ? ? ?Patient will benefit from skilled therapeutic intervention in order to improve the following deficits and impairments:  Increased fascial restricitons, Increased muscle spasms, Improper body mechanics, Postural dysfunction, Decreased mobility, Impaired flexibility, Decreased scar mobility ? ?Visit Diagnosis: ?Abnormal posture ? ?Other muscle spasm ? ? ? ? ?Problem List ?Patient Active Problem List  ? Diagnosis Date Noted  ? Chest pain 11/29/2014  ? ? ?Otelia Sergeant, PT, DPT ?12/09/2308:22 AM  ? ?[place] ?[facility] ?[address] ?[place], [place], [address] ?Phone: [phone]   Fax:  [phone] ? ?Name: Zohal Reny  Kim ?MRN: [id number] ?Date of Birth: [birth year] ? ? ? ?

## 2021-12-17 ENCOUNTER — Other Ambulatory Visit: Payer: Self-pay

## 2021-12-17 ENCOUNTER — Ambulatory Visit: Payer: Medicare Other | Admitting: Physical Therapy

## 2021-12-17 DIAGNOSIS — M6281 Muscle weakness (generalized): Secondary | ICD-10-CM | POA: Diagnosis not present

## 2021-12-17 DIAGNOSIS — R293 Abnormal posture: Secondary | ICD-10-CM | POA: Diagnosis not present

## 2021-12-17 DIAGNOSIS — R262 Difficulty in walking, not elsewhere classified: Secondary | ICD-10-CM | POA: Diagnosis not present

## 2021-12-17 DIAGNOSIS — M62838 Other muscle spasm: Secondary | ICD-10-CM | POA: Diagnosis not present

## 2021-12-17 DIAGNOSIS — M545 Low back pain, unspecified: Secondary | ICD-10-CM | POA: Diagnosis not present

## 2021-12-17 NOTE — Therapy (Signed)
[place] ?[facility] ?[place], [place], [address] ?Phone: [phone]   Fax:  [phone] ? ?Physical Therapy Treatment ? ?Patient Details  ?Name: Lynn Kim ?MRN: [id number] ?Date of Birth: 06-29-1941 ?Referring Provider (PT): Almedia Balls, NP ? ? ?Encounter Date: 12/17/2021 ? ? PT End of Session - 12/17/21 1607   ? ? Visit Number 6   ? Date for PT Re-Evaluation 12/19/21   ? Authorization Type UHC MCR   ? Progress Note Due on Visit 10   ? PT Start Time 1529   ? PT Stop Time [id number]   ? PT Time Calculation (min) 39 min   ? Activity Tolerance Patient tolerated treatment well   ? Behavior During Therapy [facility] for tasks assessed/performed   ? ?  ?  ? ?  ? ? ?Past Medical History:  ?Diagnosis Date  ? Anxiety   ? PAST HISTORY  ? Arthritis   ? bilateral hands  ? Depression   ? genetic gene  ? Heart murmur   ? CHILDHOOD FUNCTIONAL HEART MURMUR  ? Osteoporosis   ? Umbilical hernia   ? Vertigo   ? ? ?Past Surgical History:  ?Procedure Laterality Date  ? APPENDECTOMY    ? BACK SURGERY    ? BREAST EXCISIONAL BIOPSY    ? BUNIONECTOMY    ? COLONOSCOPY    ? HYSTEROSCOPY WITH D & C N/A 06/15/2014  ? Procedure: DILATATION AND CURETTAGE /HYSTEROSCOPY;  Surgeon: Annalee Genta, DO;  Location: [facility];  Service: Gynecology;  Laterality: N/A;  w/Polypectomy  ? left breast cyst removal    ? TONSILLECTOMY    ? UMBILICAL HERNIA REPAIR N/A 09/19/2020  ? Procedure: OPEN UMBILICAL HERNIA REPAIR;  Surgeon: Dwan Bolt, MD;  Location: [facility];  Service: General;  Laterality: N/A;  ? ? ?There were no vitals filed for this visit. ? ? Subjective Assessment - 12/17/21 1533   ? ? Subjective Pt reports no pain but did have a little discomfort at Lt lateral to scar site this past week one time. Pt reports she was working on scar mobility and may have pressed too hard. Other than this no pain.   ? How long can you sit comfortably? no limits   ? How long can you stand  comfortably? no limits   ? How long can you walk comfortably? no limits   ? Patient Stated Goals to not have scar tissue   ? ?  ?  ? ?  ? ? ? ? ? ? ? ? ? ? ? ? ? ? ? ? ? ? ? ? [facility] Adult PT Treatment/Exercise - 12/17/21 0001   ? ?  ? Manual Therapy  ? Manual Therapy Soft tissue mobilization;Myofascial release   ? Manual therapy comments Manual work completed with pt in supine, direct method of fascial release completed, and scar mobility and inferior umbilicus scar site. No additional adhesion or restrictions noted. Pt denied all pain and reports she understands how to complete scar mobility at home as needed and agreeable to today being DC day without concerns.   ? ?  ?  ? ?  ? ? ? ? ? ? ? ? ? ? ? ? PT Short Term Goals - 12/17/21 1611   ? ?  ? PT SHORT TERM GOAL #1  ? Title pt to be I with HEP   ? Time 3   ? Period Weeks   ? Status  Achieved   ? Target Date 11/28/21   ? ?  ?  ? ?  ? ? ? ? PT Long Term Goals - 12/17/21 1611   ? ?  ? PT LONG TERM GOAL #1  ? Title pt to be I with advanced HEP   ? Time 6   ? Period Weeks   ? Status Achieved   ? Target Date 12/19/21   ?  ? PT LONG TERM GOAL #2  ? Title Pt to report no more than 0/10 pain and no "twinges" reported from abdominal scar site   ? Time 6   ? Period Weeks   ? Status New   ? Target Date 12/19/21   ?  ? PT LONG TERM GOAL #3  ? Title pt to demonstrate no fascial restrictions in abdomen with manual assessment from PT   ? Time 6   ? Period Weeks   ? Status Achieved   ? Target Date 12/19/21   ? ?  ?  ? ?  ? ? ? ? ? ? ? ? Plan - 12/17/21 1609   ? ? Clinical Impression Statement Pt presents to clinic she has been doing scar mobility at home and maintains workout schedule and stays very active, has only had one instance of pain in lt lateral abdomen but thinks she was pushing too hard for scar mobility as this did bother her after decreasing pressure. Pt session focused on PT reassessing abdominal tissue mobility and manual work at abdomen. Pt reports she understands  how to complete scar mobility at home and denied additional questions or concerns. PT felt no additional restrictions and pt denied pain, no additional needs for PT at this time. Pt understands she will need a new referral for future PT needs.   ? Personal Factors and Comorbidities Age;Time since onset of injury/illness/exacerbation;Comorbidity 1   ? 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 PT Treatment/Interventions ADLs/Self Care Home Management;Aquatic Therapy;Functional mobility training;Therapeutic activities;Therapeutic exercise;Ultrasound;Cryotherapy;Moist Heat;Neuromuscular re-education;Manual techniques;Patient/family education;Scar mobilization;Passive range of motion;Dry needling;Energy conservation;Taping   ? PT Next Visit Plan manual at abdomen   ? PT Home Exercise Plan scar tissue mobility   ? Consulted and Agree with Plan of Care Patient   ? ?  ?  ? ?  ? ? ?Patient will benefit from skilled therapeutic intervention in order to improve the following deficits and impairments:  Increased fascial restricitons, Increased muscle spasms, Improper body mechanics, Postural dysfunction, Decreased mobility, Impaired flexibility, Decreased scar mobility ? ?Visit Diagnosis: ?Abnormal posture ? ? ? ? ?Problem List ?Patient Active Problem List  ? Diagnosis Date Noted  ? Chest pain 11/29/2014  ? ?PHYSICAL THERAPY DISCHARGE SUMMARY ? ?Visits from Start of Care: 6 ? ?Current functional level related to goals / functional outcomes: ?All goals met ?  ?Remaining deficits: ?All goals met ?  ?Education / Equipment: ?HEP  ? ?Patient agrees to discharge. Patient goals were met. Patient is being discharged due to meeting the stated rehab goals. Thank you for the referral.  ? ?Stacy Gardner, PT, DPT ?03/21/234:12 PM  ? ? ?[place] ?[facility] ?[place], [place], [address] ?Phone: [phone]   Fax:  [phone] ? ?Name: Lynn Kim ?MRN: [id number] ?Date of Birth: Jul 07, 1941 ? ? ? ?

## 2021-12-24 ENCOUNTER — Encounter: Payer: Self-pay | Admitting: Physical Therapy

## 2021-12-24 ENCOUNTER — Other Ambulatory Visit: Payer: Self-pay

## 2021-12-24 ENCOUNTER — Ambulatory Visit: Payer: Medicare Other | Admitting: Physical Therapy

## 2021-12-24 DIAGNOSIS — R262 Difficulty in walking, not elsewhere classified: Secondary | ICD-10-CM | POA: Diagnosis not present

## 2021-12-24 DIAGNOSIS — M62838 Other muscle spasm: Secondary | ICD-10-CM | POA: Diagnosis not present

## 2021-12-24 DIAGNOSIS — R293 Abnormal posture: Secondary | ICD-10-CM | POA: Diagnosis not present

## 2021-12-24 DIAGNOSIS — M6281 Muscle weakness (generalized): Secondary | ICD-10-CM

## 2021-12-24 DIAGNOSIS — M545 Low back pain, unspecified: Secondary | ICD-10-CM | POA: Diagnosis not present

## 2021-12-24 NOTE — Therapy (Signed)
[place] ?[facility] ?[address] ?[place], [place], [address] ?Phone: [phone]   Fax:  [phone] ? ?Physical Therapy Evaluation ? ?Patient Details  ?Name: Lynn ScullJoyce J D'Arconte ?MRN: [id number] ?Date of Birth: September 19, 1941 ?Referring Provider (PT): White ? ? ?Encounter Date: 12/24/2021 ? ? PT End of Session - 12/24/21 1753   ? ? Visit Number 1   ? Date for PT Re-Evaluation 03/26/22   ? Authorization Type UHC MCR   ? PT Start Time 1706   ? PT Stop Time 1754   ? PT Time Calculation (min) 48 min   ? Activity Tolerance Patient tolerated treatment well   ? Behavior [facility] Therapy [facility] for tasks assessed/performed   ? ?  ?  ? ?  ? ? ?Past Medical History:  ?Diagnosis Date  ? Anxiety   ? PAST HISTORY  ? Arthritis   ? bilateral hands  ? Depression   ? genetic gene  ? Heart murmur   ? CHILDHOOD FUNCTIONAL HEART MURMUR  ? Osteoporosis   ? Umbilical hernia   ? Vertigo   ? ? ?Past Surgical History:  ?Procedure Laterality Date  ? APPENDECTOMY    ? BACK SURGERY    ? BREAST EXCISIONAL BIOPSY    ? BUNIONECTOMY    ? COLONOSCOPY    ? HYSTEROSCOPY WITH D & C N/A 06/15/2014  ? Procedure: DILATATION AND CURETTAGE /HYSTEROSCOPY;  Surgeon: Sharon SellerJennifer M Ozan, DO;  Location: [facility];  Service: Gynecology;  Laterality: N/A;  w/Polypectomy  ? left breast cyst removal    ? TONSILLECTOMY    ? UMBILICAL HERNIA REPAIR N/A 09/19/2020  ? Procedure: OPEN UMBILICAL HERNIA REPAIR;  Surgeon: Fritzi MandesAllen, Shelby L, MD;  Location: [facility];  Service: General;  Laterality: N/A;  ? ? ?There were no vitals filed for this visit. ? ? ? Subjective Assessment - 12/24/21 1715   ? ? 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 Currently in Pain? Yes   ? Pain Score 2    ? Pain Location Back   ? Pain Orientation Lower   ? Pain  Descriptors / Indicators Aching;Sore;Tightness   ? Pain Radiating Towards has pain into the buttocks and the ITB areas to the knees   ? Pain Onset More than a month ago   ? Pain Frequency Intermittent   ? Aggravating Factors  worse at night, sitting pain up to 5/10   ? Pain Relieving Factors exercises movements, sitting on a flat surface pain can be 0/10   ? Effect of Pain on Daily Activities difficulty sitting and sleeping, some fear with workouts   ? ?  ?  ? ?  ? ? ? ? ? [facility] PT Assessment - 12/24/21 0001   ? ?  ? Assessment  ? Medical Diagnosis lumbar pain and scoliosis   ? Referring Provider (PT) White   ? Onset Date/Surgical Date 11/26/21   ? Hand Dominance Right   ? Prior Therapy yes for scar tissue after previous surgery   ?  ? Precautions  ? Precautions None   ?  ? Home Environment  ? Additional Comments stairs, does yardowrk   ?  ? Prior Function  ? Level of Independence Independent   ?  ? Posture/Postural Control  ? Posture Comments  rounded shoulders   ?  ? AROM  ? 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   ?  ? Flexibility  ? Hamstrings tight, very tight calves   ? Quadriceps tight   ? ITB tight and tender   ? Piriformis tight and tender   ?  ? Palpation  ? Palpation comment she is tight and tender in the lumbar SI, buttocks and ITB some anterior thighs   ? ?  ?  ? ?  ? ? ? ? ? ? ? ? ? ? ? ? ? ?Objective measurements completed on examination: See above findings.  ? ? ? ? ? ? ? ? ? ? ? ? ? ? ? ? PT Short Term Goals - 12/17/21 1611   ? ?  ? PT SHORT TERM GOAL #1  ? Title pt to be I with HEP   ? Time 3   ? Period Weeks   ? Status Achieved   ? Target Date 11/28/21   ? ?  ?  ? ?  ? ? ? ? PT Long Term Goals - 12/24/21 1759   ? ?  ? PT LONG TERM GOAL #1  ? Title pt to be I with advanced HEP   ? Time 12   ? Period Weeks   ? Status New   ?  ? PT LONG TERM  GOAL #2  ? Title Pt to report no more than 0/10 pain  at night in the back and the hips   ? Time 12   ? Period Weeks   ? Status New   ?  ? PT LONG TERM GOAL #3  ? Title sit in chair to watch a movie without having to get up due to pain and discomfort   ? Time 12   ? Period Weeks   ? Status New   ?  ? PT LONG TERM GOAL #4  ? Title increase SLR to 90 degrees   ? Time 12   ? Period Weeks   ? Status New   ? ?  ?  ? ?  ? ? ? ? ? ? ? ? ? Plan - 12/24/21 1754   ? ? Clinical Impression Statement Patient finished PT for the restrictions of the scar from abdominal surgery last week.  She has an order for [place] for LBP, scoliosis, A recent xrays showed DDD at L5-S1 and a 68mm anterolisthesis, she is fused with harrington rods ftom T2-L5.  She is familiar to me as I have seen her different times over the years, she is very active and wants to assure that her exercise routines do not hurt her.  She is very tight in the LE's especially the calves.  She is tender in the buttocks, ITB and the lumbar area some tenderness in the atnerior thighs.   ? 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 Stability/Clinical Decision Making Stable/Uncomplicated   ? Clinical Decision Making Low   ? Rehab Potential Good   ? PT Frequency 1x / week   may do every other week  ? PT Duration 12 weeks   ? PT Treatment/Interventions ADLs/Self Care Home Management;Aquatic Therapy;Functional mobility training;Therapeutic activities;Therapeutic exercise;Ultrasound;Cryotherapy;Moist Heat;Neuromuscular re-education;Manual techniques;Patient/family education;Scar mobilization;Passive range of  motion;Dry needling;Energy conservation;Taping   ? PT Next Visit Plan go over her exercise routine, flexibility, STM   ? Consulted and Agree with Plan of Care Patient   ? ?  ?  ? ?  ? ? ?Patient will benefit from skilled therapeutic intervention in order  to improve the following deficits and impairments:  Increased fascial restricitons, Increased muscle spasms, Improper body mechanics, Postural dysfunction, Decreased mobility, Impaired flexibility, Decreased scar mobility, Decreased range of motion, Pain ? ?Visit Diagnosis: ?Abnormal posture - Plan: PT plan of care cert/re-cert ? ?Acute bilateral low back pain without sciatica - Plan: PT plan of care cert/re-cert ? ?Muscle weakness (generalized) - Plan: PT plan of care cert/re-cert ? ?Difficulty in walking, not elsewhere classified - Plan: PT plan of care cert/re-cert ? ? ? ? ?Problem List ?Patient Active Problem List  ? Diagnosis Date Noted  ? Chest pain 11/29/2014  ? ? Jearld Lesch, PT ?12/24/2021, 6:02 PM ? ?[place] ?[facility] ?[address] ?[place], [place], [address] ?Phone: [phone]   Fax:  [phone] ? ?Name: Karista Aispuro D'Arconte ?MRN: [id number] ?Date of Birth: 28-Mar-1941 ? ? ?

## 2021-12-30 DIAGNOSIS — E559 Vitamin D deficiency, unspecified: Secondary | ICD-10-CM | POA: Diagnosis not present

## 2021-12-30 DIAGNOSIS — E039 Hypothyroidism, unspecified: Secondary | ICD-10-CM | POA: Diagnosis not present

## 2021-12-30 DIAGNOSIS — R799 Abnormal finding of blood chemistry, unspecified: Secondary | ICD-10-CM | POA: Diagnosis not present

## 2022-01-14 ENCOUNTER — Encounter: Payer: Self-pay | Admitting: Physical Therapy

## 2022-01-14 ENCOUNTER — Ambulatory Visit: Payer: Medicare Other | Attending: Family Medicine | Admitting: Physical Therapy

## 2022-01-14 DIAGNOSIS — M545 Low back pain, unspecified: Secondary | ICD-10-CM | POA: Diagnosis not present

## 2022-01-14 DIAGNOSIS — R293 Abnormal posture: Secondary | ICD-10-CM | POA: Insufficient documentation

## 2022-01-14 DIAGNOSIS — M62838 Other muscle spasm: Secondary | ICD-10-CM | POA: Diagnosis not present

## 2022-01-14 DIAGNOSIS — R262 Difficulty in walking, not elsewhere classified: Secondary | ICD-10-CM | POA: Insufficient documentation

## 2022-01-14 DIAGNOSIS — M6281 Muscle weakness (generalized): Secondary | ICD-10-CM | POA: Insufficient documentation

## 2022-01-14 NOTE — Therapy (Signed)
[place] ?[facility] ?[address] ?[place], [place], [address] ?Phone: [phone]   Fax:  [phone] ? ?Physical Therapy Treatment ? ?Patient Details  ?Name: Lynn Kim ?MRN: [id number] ?Date of Birth: 24-Aug-1941 ?Referring Provider (PT): White ? ? ?Encounter Date: 01/14/2022 ? ? PT End of Session - 01/14/22 1842   ? ? Visit Number 2   ? Date for PT Re-Evaluation 03/26/22   ? Authorization Type UHC MCR   ? PT Start Time 1751   ? PT Stop Time 1850   ? PT Time Calculation (min) 59 min   ? Activity Tolerance Patient tolerated treatment well   ? Behavior During Therapy [facility] for tasks assessed/performed   ? ?  ?  ? ?  ? ? ?Past Medical History:  ?Diagnosis Date  ? Anxiety   ? PAST HISTORY  ? Arthritis   ? bilateral hands  ? Depression   ? genetic gene  ? Heart murmur   ? CHILDHOOD FUNCTIONAL HEART MURMUR  ? Osteoporosis   ? Umbilical hernia   ? Vertigo   ? ? ?Past Surgical History:  ?Procedure Laterality Date  ? APPENDECTOMY    ? BACK SURGERY    ? BREAST EXCISIONAL BIOPSY    ? BUNIONECTOMY    ? COLONOSCOPY    ? HYSTEROSCOPY WITH D & C N/A 06/15/2014  ? Procedure: DILATATION AND CURETTAGE /HYSTEROSCOPY;  Surgeon: Sharon Seller, DO;  Location: [facility];  Service: Gynecology;  Laterality: N/A;  w/Polypectomy  ? left breast cyst removal    ? TONSILLECTOMY    ? UMBILICAL HERNIA REPAIR N/A 09/19/2020  ? Procedure: OPEN UMBILICAL HERNIA REPAIR;  Surgeon: Fritzi Mandes, MD;  Location: [facility];  Service: General;  Laterality: N/A;  ? ? ?There were no vitals filed for this visit. ? ? Subjective Assessment - 01/14/22 1840   ? ? Subjective Patient had questions about yard work and stretches   ? Currently in Pain? Yes   ? Pain Score 4    ? Pain Location Back   ? Aggravating Factors  yardwork   ? ?  ?  ? ?  ? ? ? ? ? ? ? ? ? ? ? ? ? ? ? ? ? ? ? ? [facility] Adult PT Treatment/Exercise - 01/14/22 0001   ? ?  ? Therapeutic Activites   ? Therapeutic Activities Work Simulation    ? Work Higher education careers adviser, gardening, using the shovel   ?  ? Exercises  ? Exercises Lumbar   ?  ? Lumbar Exercises: Stretches  ? Passive Hamstring Stretch Right;Left;4 reps;20 seconds   ? Single Knee to Chest Stretch Right;Left;4 reps;10 seconds   ? Lower Trunk Rotation 4 reps;10 seconds   ? Quad Stretch Right;Left;4 reps;20 seconds   ? ITB Stretch Right;Left;4 reps;20 seconds   ? Piriformis Stretch Right;Left;4 reps;20 seconds   ? Gastroc Stretch Right;Left;3 reps;20 seconds   ?  ? Modalities  ? Modalities Moist Heat;Electrical Stimulation   ?  ? Moist Heat Therapy  ? Number Minutes Moist Heat 10 Minutes   ? Moist Heat Location Hip;Lumbar Spine   ?  ? Electrical Stimulation  ? Electrical Stimulation Location back/hip   ? Electrical Stimulation Action IFC   ? Electrical Stimulation Parameters supine   ? Electrical Stimulation Goals Pain   ?  ? Manual Therapy  ? Manual Therapy Soft tissue mobilization   ? Soft tissue mobilization quads, hip flexors and ITB bilaterally   ? ?  ?  ? ?  ? ? ? ? ? ? ? ? ? ? ? ?  PT Short Term Goals - 01/14/22 1845   ? ?  ? PT SHORT TERM GOAL #1  ? Title pt to be I with HEP   ? Status Achieved   ? ?  ?  ? ?  ? ? ? ? PT Long Term Goals - 01/14/22 1845   ? ?  ? PT LONG TERM GOAL #1  ? Title pt to be I with advanced HEP   ? Status On-going   ?  ? PT LONG TERM GOAL #2  ? Title Pt to report no more than 0/10 pain  at night in the back and the hips   ? Status On-going   ? ?  ?  ? ?  ? ? ? ? ? ? ? ? Plan - 01/14/22 1844   ? ? Clinical Impression Statement Patient has been doing some yardwork, having a little more pain and c/oi LE tightness, I worked on the LE tightness with stretches and STM, we talked about posture and body mechanics for her gardening and some of the gym exercises, she is tight and tender in the hip flexors, ITB and quads   ? PT Next Visit Plan go over her exercise routine, flexibility, STM   ? Consulted and Agree with Plan of Care Patient   ? ?  ?  ? ?  ? ? ?Patient will  benefit from skilled therapeutic intervention in order to improve the following deficits and impairments:  Increased fascial restricitons, Increased muscle spasms, Improper body mechanics, Postural dysfunction, Decreased mobility, Impaired flexibility, Decreased scar mobility, Decreased range of motion, Pain ? ?Visit Diagnosis: ?Abnormal posture ? ?Acute bilateral low back pain without sciatica ? ?Muscle weakness (generalized) ? ?Difficulty in walking, not elsewhere classified ? ?Other muscle spasm ? ? ? ? ?Problem List ?Patient Active Problem List  ? Diagnosis Date Noted  ? Chest pain 11/29/2014  ? ? Lynn Kim, PT ?01/14/2022, 6:46 PM ? ?[place] ?[facility] ?[address] ?[place], [place], [address] ?Phone: [phone]   Fax:  [phone] ? ?Name: Lynn Kim ?MRN: [id number] ?Date of Birth: 1941-04-21 ? ? ? ?

## 2022-01-15 DIAGNOSIS — H903 Sensorineural hearing loss, bilateral: Secondary | ICD-10-CM | POA: Diagnosis not present

## 2022-01-27 ENCOUNTER — Ambulatory Visit: Payer: Medicare Other | Attending: Family Medicine | Admitting: Physical Therapy

## 2022-01-27 ENCOUNTER — Encounter: Payer: Self-pay | Admitting: Physical Therapy

## 2022-01-27 DIAGNOSIS — M6281 Muscle weakness (generalized): Secondary | ICD-10-CM | POA: Insufficient documentation

## 2022-01-27 DIAGNOSIS — M62838 Other muscle spasm: Secondary | ICD-10-CM | POA: Insufficient documentation

## 2022-01-27 DIAGNOSIS — M25552 Pain in left hip: Secondary | ICD-10-CM | POA: Insufficient documentation

## 2022-01-27 DIAGNOSIS — R252 Cramp and spasm: Secondary | ICD-10-CM | POA: Diagnosis not present

## 2022-01-27 DIAGNOSIS — M25551 Pain in right hip: Secondary | ICD-10-CM | POA: Diagnosis not present

## 2022-01-27 DIAGNOSIS — M545 Low back pain, unspecified: Secondary | ICD-10-CM | POA: Insufficient documentation

## 2022-01-27 DIAGNOSIS — R293 Abnormal posture: Secondary | ICD-10-CM | POA: Diagnosis not present

## 2022-01-27 DIAGNOSIS — R262 Difficulty in walking, not elsewhere classified: Secondary | ICD-10-CM | POA: Insufficient documentation

## 2022-01-27 NOTE — Therapy (Signed)
[place] ?[address] ?[place], [place], [address] ?Phone: [phone]   Fax:  [phone] ? ?Physical Therapy Treatment ? ?Patient Details  ?Name: Lynn Kim ?MRN: [id number] ?Date of Birth: March 26, 1941 ?Referring Provider (PT): White ? ? ?Encounter Date: 01/27/2022 ? ? PT End of Session - 01/27/22 1518   ? ? Visit Number 3   ? Date for PT Re-Evaluation 03/26/22   ? Authorization Type UHC MCR   ? PT Start Time 1441   ? PT Stop Time 1530   ? PT Time Calculation (min) 49 min   ? Activity Tolerance Patient tolerated treatment well   ? Behavior During Therapy [facility] for tasks assessed/performed   ? ?  ?  ? ?  ? ? ?Past Medical History:  ?Diagnosis Date  ? Anxiety   ? PAST HISTORY  ? Arthritis   ? bilateral hands  ? Depression   ? genetic gene  ? Heart murmur   ? CHILDHOOD FUNCTIONAL HEART MURMUR  ? Osteoporosis   ? Umbilical hernia   ? Vertigo   ? ? ?Past Surgical History:  ?Procedure Laterality Date  ? APPENDECTOMY    ? BACK SURGERY    ? BREAST EXCISIONAL BIOPSY    ? BUNIONECTOMY    ? COLONOSCOPY    ? HYSTEROSCOPY WITH D & C N/A 06/15/2014  ? Procedure: DILATATION AND CURETTAGE /HYSTEROSCOPY;  Surgeon: Annalee Genta, DO;  Location: [facility];  Service: Gynecology;  Laterality: N/A;  w/Polypectomy  ? left breast cyst removal    ? TONSILLECTOMY    ? UMBILICAL HERNIA REPAIR N/A 09/19/2020  ? Procedure: OPEN UMBILICAL HERNIA REPAIR;  Surgeon: Dwan Bolt, MD;  Location: [facility];  Service: General;  Laterality: N/A;  ? ? ?There were no vitals filed for this visit. ? ? Subjective Assessment - 01/27/22 1515   ? ? Subjective Patient comes in with a list of questions, reports that she still has some hip pain with stairs, and getting up from sitting,  reports that she is doing an exercise class   ? Currently in Pain? Yes   ? Pain Score 3    ? Pain Location Hip   ? Pain Orientation Right;Anterior   ? Pain Descriptors / Indicators Spasm;Tightness   ? ?  ?   ? ?  ? ? ? ? ? ? ? ? ? ? ? ? ? ? ? ? ? ? ? ? [facility] Adult PT Treatment/Exercise - 01/27/22 0001   ? ?  ? Lumbar Exercises: Stretches  ? Passive Hamstring Stretch Right;Left;4 reps;20 seconds   ? Single Knee to Chest Stretch Right;Left;4 reps;10 seconds   ? Lower Trunk Rotation 4 reps;10 seconds   ? Quad Stretch Right;Left;4 reps;20 seconds   ?  ? Moist Heat Therapy  ? Number Minutes Moist Heat 10 Minutes   ? Moist Heat Location Hip;Lumbar Spine   ?  ? Electrical Stimulation  ? Electrical Stimulation Location back/hip   ? Electrical Stimulation Action IFC   ? Electrical Stimulation Parameters supine   ? Electrical Stimulation Goals Pain   ?  ? Manual Therapy  ? Manual Therapy Soft tissue mobilization   ? Soft tissue mobilization quads, hip flexors and ITB bilaterally   ? ?  ?  ? ?  ? ? ? ? ? ? ? ? ? ? PT Education - 01/27/22 1517   ? ? Education Details went over some hte questions that she had  about her exercise class, tried to guide her on safety   ? Person(s) Educated Patient   ? Methods Explanation;Demonstration   ? Comprehension Verbalized understanding   ? ?  ?  ? ?  ? ? ? PT Short Term Goals - 01/27/22 1521   ? ?  ? PT SHORT TERM GOAL #1  ? Title pt to be I with HEP   ? Status Achieved   ? ?  ?  ? ?  ? ? ? ? PT Long Term Goals - 01/27/22 1521   ? ?  ? PT LONG TERM GOAL #1  ? Title pt to be I with advanced HEP   ? Status On-going   ?  ? PT LONG TERM GOAL #2  ? Title Pt to report no more than 0/10 pain  at night in the back and the hips   ? Status On-going   ?  ? PT LONG TERM GOAL #3  ? Title sit in chair to watch a movie without having to get up due to pain and discomfort   ? Status On-going   ?  ? PT LONG TERM GOAL #4  ? Title increase SLR to 90 degrees   ? Status On-going   ? ?  ?  ? ?  ? ? ? ? ? ? ? ? Plan - 01/27/22 1520   ? ? Clinical Impression Statement Patient doing better, still some hip pain and tightness in the right hip with stairs and up and down from sitting, she does some higher level exercises  and I went over some ways to decrease the stress on the back with her and she was able to understand   ? ?  ?  ? ?  ? ? ?Patient will benefit from skilled therapeutic intervention in order to improve the following deficits and impairments:  Increased fascial restricitons, Increased muscle spasms, Improper body mechanics, Postural dysfunction, Decreased mobility, Impaired flexibility, Decreased scar mobility, Decreased range of motion, Pain ? ?Visit Diagnosis: ?Abnormal posture ? ?Acute bilateral low back pain without sciatica ? ?Muscle weakness (generalized) ? ?Difficulty in walking, not elsewhere classified ? ?Other muscle spasm ? ?Pain in right hip ? ?Pain in left hip ? ? ? ? ?Problem List ?Patient Active Problem List  ? Diagnosis Date Noted  ? Chest pain 11/29/2014  ? ? Sumner Boast, PT ?01/27/2022, 3:22 PM ? ?[place] ?[address] ?[place], [place], [address] ?Phone: [phone]   Fax:  [phone] ? ?Name: Lynn Kim ?MRN: [id number] ?Date of Birth: 1941/09/21 ? ? ? ?

## 2022-02-10 ENCOUNTER — Encounter: Payer: Self-pay | Admitting: Physical Therapy

## 2022-02-10 ENCOUNTER — Ambulatory Visit: Payer: Medicare Other | Admitting: Physical Therapy

## 2022-02-10 DIAGNOSIS — R262 Difficulty in walking, not elsewhere classified: Secondary | ICD-10-CM | POA: Diagnosis not present

## 2022-02-10 DIAGNOSIS — R293 Abnormal posture: Secondary | ICD-10-CM

## 2022-02-10 DIAGNOSIS — M545 Low back pain, unspecified: Secondary | ICD-10-CM | POA: Diagnosis not present

## 2022-02-10 DIAGNOSIS — M25551 Pain in right hip: Secondary | ICD-10-CM | POA: Diagnosis not present

## 2022-02-10 DIAGNOSIS — M62838 Other muscle spasm: Secondary | ICD-10-CM | POA: Diagnosis not present

## 2022-02-10 DIAGNOSIS — M25552 Pain in left hip: Secondary | ICD-10-CM | POA: Diagnosis not present

## 2022-02-10 DIAGNOSIS — R252 Cramp and spasm: Secondary | ICD-10-CM | POA: Diagnosis not present

## 2022-02-10 DIAGNOSIS — M6281 Muscle weakness (generalized): Secondary | ICD-10-CM

## 2022-02-10 NOTE — Therapy (Signed)
[employer] ?[address] ?[place], [place], [address] ?Phone: [phone]   Fax:  [phone] ? ?Physical Therapy Treatment ? ?Patient Details  ?Name: Lynn Kim ?MRN: [id number] ?Date of Birth: December 30, 1940 ?Referring Provider (PT): White ? ? ?Encounter Date: 02/10/2022 ? ? PT End of Session - 02/10/22 1530   ? ? Visit Number 4   ? Date for PT Re-Evaluation 03/26/22   ? Authorization Type UHC MCR   ? PT Start Time 1355   ? PT Stop Time 1435   ? PT Time Calculation (min) 40 min   ? Activity Tolerance Patient tolerated treatment well   ? ?  ?  ? ?  ? ? ?Past Medical History:  ?Diagnosis Date  ? Anxiety   ? PAST HISTORY  ? Arthritis   ? bilateral hands  ? Depression   ? genetic gene  ? Heart murmur   ? CHILDHOOD FUNCTIONAL HEART MURMUR  ? Osteoporosis   ? Umbilical hernia   ? Vertigo   ? ? ?Past Surgical History:  ?Procedure Laterality Date  ? APPENDECTOMY    ? BACK SURGERY    ? BREAST EXCISIONAL BIOPSY    ? BUNIONECTOMY    ? COLONOSCOPY    ? HYSTEROSCOPY WITH D & C N/A 06/15/2014  ? Procedure: DILATATION AND CURETTAGE /HYSTEROSCOPY;  Surgeon: Annalee Genta, DO;  Location: [facility];  Service: Gynecology;  Laterality: N/A;  w/Polypectomy  ? left breast cyst removal    ? TONSILLECTOMY    ? UMBILICAL HERNIA REPAIR N/A 09/19/2020  ? Procedure: OPEN UMBILICAL HERNIA REPAIR;  Surgeon: Dwan Bolt, MD;  Location: [facility];  Service: General;  Laterality: N/A;  ? ? ?There were no vitals filed for this visit. ? ? Subjective Assessment - 02/10/22 1356   ? ? Subjective I am getting better a little less pain with the stairs, but when I get tired I do feel it.  Still hurting in the neck and feel tight   ? ?  ?  ? ?  ? ? ? ? ? ? ? ? ? ? ? ? ? ? ? ? ? ? ? ? [facility] Adult PT Treatment/Exercise - 02/10/22 0001   ? ?  ? Lumbar Exercises: Stretches  ? Passive Hamstring Stretch Right;Left;4 reps;20 seconds   ? Single Knee to Chest Stretch Right;Left;4 reps;10  seconds   ? Lower Trunk Rotation 4 reps;10 seconds   ? Quad Stretch Right;Left;4 reps;20 seconds   ? ITB Stretch Right;Left;4 reps;20 seconds   ? Piriformis Stretch Right;Left;4 reps;20 seconds   ? Other Lumbar Stretch Exercise adductor stretch   ?  ? Lumbar Exercises: Supine  ? Other Supine Lumbar Exercises feet on ball K2C, trunk rotation, small bridges, isometric   ?  ? Manual Therapy  ? Manual Therapy Soft tissue mobilization   ? Soft tissue mobilization quads, hip flexors and ITB bilaterally   ? ?  ?  ? ?  ? ? ? ? ? ? ? ? ? ? ? ? PT Short Term Goals - 01/27/22 1521   ? ?  ? PT SHORT TERM GOAL #1  ? Title pt to be I with HEP   ? Status Achieved   ? ?  ?  ? ?  ? ? ? ? PT Long Term Goals - 02/10/22 1550   ? ?  ? PT LONG TERM GOAL #1  ? Title pt to be I with advanced HEP   ?  Status Partially Met   ?  ? PT LONG TERM GOAL #2  ? Title Pt to report no more than 0/10 pain  at night in the back and the hips   ? Status Partially Met   ?  ? PT LONG TERM GOAL #3  ? Title sit in chair to watch a movie without having to get up due to pain and discomfort   ? Status Partially Met   ? ?  ?  ? ?  ? ? ? ? ? ? ? ? Plan - 02/10/22 1531   ? ? Clinical Impression Statement Pateint reports that overall she is feeling better with less pain and less issues overall, she was tight in the LE's today, less difficulty getting up and less problems going up the stairs   ? PT Next Visit Plan go over her exercise routine, flexibility, STM   ? Consulted and Agree with Plan of Care Patient   ? ?  ?  ? ?  ? ? ?Patient will benefit from skilled therapeutic intervention in order to improve the following deficits and impairments:  Increased fascial restricitons, Increased muscle spasms, Improper body mechanics, Postural dysfunction, Decreased mobility, Impaired flexibility, Decreased scar mobility, Decreased range of motion, Pain ? ?Visit Diagnosis: ?Abnormal posture ? ?Acute bilateral low back pain without sciatica ? ?Muscle weakness  (generalized) ? ?Difficulty in walking, not elsewhere classified ? ?Other muscle spasm ? ?Pain in right hip ? ?Pain in left hip ? ? ? ? ?Problem List ?Patient Active Problem List  ? Diagnosis Date Noted  ? Chest pain 11/29/2014  ? ? Sumner Boast, PT ?02/10/2022, 3:52 PM ? ?[place] ?[address] ?[place], [place], [address] ?Phone: [phone]   Fax:  [phone] ? ?Name: Lynn Kim ?MRN: [id number] ?Date of Birth: 11-11-1940 ? ? ? ?

## 2022-02-25 ENCOUNTER — Encounter: Payer: Self-pay | Admitting: Physical Therapy

## 2022-02-25 ENCOUNTER — Ambulatory Visit: Payer: Medicare Other | Admitting: Physical Therapy

## 2022-02-25 DIAGNOSIS — R293 Abnormal posture: Secondary | ICD-10-CM

## 2022-02-25 DIAGNOSIS — M62838 Other muscle spasm: Secondary | ICD-10-CM

## 2022-02-25 DIAGNOSIS — R252 Cramp and spasm: Secondary | ICD-10-CM

## 2022-02-25 DIAGNOSIS — M25551 Pain in right hip: Secondary | ICD-10-CM

## 2022-02-25 DIAGNOSIS — R262 Difficulty in walking, not elsewhere classified: Secondary | ICD-10-CM | POA: Diagnosis not present

## 2022-02-25 DIAGNOSIS — M25552 Pain in left hip: Secondary | ICD-10-CM | POA: Diagnosis not present

## 2022-02-25 DIAGNOSIS — M545 Low back pain, unspecified: Secondary | ICD-10-CM

## 2022-02-25 DIAGNOSIS — M6281 Muscle weakness (generalized): Secondary | ICD-10-CM | POA: Diagnosis not present

## 2022-02-25 NOTE — Therapy (Signed)
[place]. [place], [place], [address] Phone: [phone]   Fax:  [phone]  Physical Therapy Treatment  Patient Details  Name: Lynn Kim MRN: [id number] Date of Birth: Sep 07, 1941 Referring Provider (PT): Dema Severin   Encounter Date: 02/25/2022   PT End of Session - 02/25/22 1436     Visit Number 5    Date for PT Re-Evaluation 03/26/22    Authorization Type UHC MCR    PT Start Time 8768    PT Stop Time 1450    PT Time Calculation (min) 58 min    Activity Tolerance Patient tolerated treatment well    Behavior During Therapy WFL for tasks assessed/performed             Past Medical History:  Diagnosis Date   Anxiety    PAST HISTORY   Arthritis    bilateral hands   Depression    genetic gene   Heart murmur    CHILDHOOD FUNCTIONAL HEART MURMUR   Osteoporosis    Umbilical hernia    Vertigo     Past Surgical History:  Procedure Laterality Date   APPENDECTOMY     BACK SURGERY     BREAST EXCISIONAL BIOPSY     BUNIONECTOMY     COLONOSCOPY     HYSTEROSCOPY WITH D & C N/A 06/15/2014   Procedure: DILATATION AND CURETTAGE /HYSTEROSCOPY;  Surgeon: Annalee Genta, DO;  Location: [facility];  Service: Gynecology;  Laterality: N/A;  w/Polypectomy   left breast cyst removal     TONSILLECTOMY     UMBILICAL HERNIA REPAIR N/A 09/19/2020   Procedure: OPEN UMBILICAL HERNIA REPAIR;  Surgeon: Dwan Bolt, MD;  Location: [facility];  Service: General;  Laterality: N/A;    There were no vitals filed for this visit.   Subjective Assessment - 02/25/22 1437     Subjective Patietn reports that she has been doing well, reports that she was doing good until she drove to [facility] and back yesterday. Reports tight and stiff    Currently in Pain? Yes    Pain Score 5     Pain Location Hip    Pain Orientation Right;Anterior    Pain Descriptors / Indicators Tightness;Sore;Spasm    Aggravating Factors   driving                               [facility] Adult PT Treatment/Exercise - 02/25/22 0001       Lumbar Exercises: Stretches   Passive Hamstring Stretch Right;Left;4 reps;20 seconds    Single Knee to Chest Stretch Right;Left;4 reps;10 seconds    Lower Trunk Rotation 4 reps;10 seconds    Quad Stretch Right;Left;4 reps;20 seconds    ITB Stretch Right;Left;4 reps;20 seconds    Piriformis Stretch Right;Left;4 reps;20 seconds    Other Lumbar Stretch Exercise adductor stretch      Lumbar Exercises: Supine   Bridge with [employer] 20 reps;1 second    Bridge with clamshell 20 reps;1 second    Other Supine Lumbar Exercises feet on ball K2C, trunk rotation, small bridges, isometric      Lumbar Exercises: Sidelying   Clam Both;20 reps;1 second    Clam Limitations 5#      Moist Heat Therapy   Number Minutes Moist Heat 10 Minutes    Moist Heat Location Hip;Lumbar Spine      Electrical Stimulation   Electrical Stimulation  Location back/hip    Electrical Stimulation Action IFC    Electrical Stimulation Parameters supine    Electrical Stimulation Goals Pain      Manual Therapy   Manual Therapy Soft tissue mobilization    Soft tissue mobilization quads, hip flexors and ITB bilaterally                       PT Short Term Goals - 01/27/22 1521       PT SHORT TERM GOAL #1   Title pt to be I with HEP    Status Achieved               PT Long Term Goals - 02/25/22 1440       PT LONG TERM GOAL #1   Title pt to be I with advanced HEP    Status Partially Met      PT LONG TERM GOAL #2   Title Pt to report no more than 0/10 pain  at night in the back and the hips    Status Partially Met      PT LONG TERM GOAL #3   Title sit in chair to watch a movie without having to get up due to pain and discomfort    Status Partially Met      PT LONG TERM GOAL #4   Title increase SLR to 90 degrees    Status Partially Met                    Plan - 02/25/22 1441     Clinical Impression Statement Patient doing well until she drove to [facility], she reports a lot of holiday traffic and stress, reports that the right hip is hurting and is tight  She has good ROM and good strength just very tight in the right hip mms    PT Next Visit Plan go over her exercise routine, flexibility, STM    Consulted and Agree with Plan of Care Patient             Patient will benefit from skilled therapeutic intervention in order to improve the following deficits and impairments:  Increased fascial restricitons, Increased muscle spasms, Improper body mechanics, Postural dysfunction, Decreased mobility, Impaired flexibility, Decreased scar mobility, Decreased range of motion, Pain  Visit Diagnosis: Abnormal posture  Acute bilateral low back pain without sciatica  Muscle weakness (generalized)  Difficulty in walking, not elsewhere classified  Other muscle spasm  Pain in right hip  Cramp and spasm     Problem List Patient Active Problem List   Diagnosis Date Noted   Chest pain 11/29/2014    Sumner Boast, PT 02/25/2022, 2:44 PM  [place]. [place], [place], [address] Phone: [phone]   Fax:  [phone]  Name: Lynn Kim MRN: [id number] Date of Birth: 16-Oct-1940

## 2022-03-12 DIAGNOSIS — Z0000 Encounter for general adult medical examination without abnormal findings: Secondary | ICD-10-CM | POA: Diagnosis not present

## 2022-03-12 DIAGNOSIS — L821 Other seborrheic keratosis: Secondary | ICD-10-CM | POA: Diagnosis not present

## 2022-03-12 DIAGNOSIS — M810 Age-related osteoporosis without current pathological fracture: Secondary | ICD-10-CM | POA: Diagnosis not present

## 2022-03-12 DIAGNOSIS — H9193 Unspecified hearing loss, bilateral: Secondary | ICD-10-CM | POA: Diagnosis not present

## 2022-03-12 DIAGNOSIS — R1033 Periumbilical pain: Secondary | ICD-10-CM | POA: Diagnosis not present

## 2022-03-12 DIAGNOSIS — R0981 Nasal congestion: Secondary | ICD-10-CM | POA: Diagnosis not present

## 2022-03-19 DIAGNOSIS — R799 Abnormal finding of blood chemistry, unspecified: Secondary | ICD-10-CM | POA: Diagnosis not present

## 2022-03-19 DIAGNOSIS — E039 Hypothyroidism, unspecified: Secondary | ICD-10-CM | POA: Diagnosis not present

## 2022-03-19 DIAGNOSIS — E559 Vitamin D deficiency, unspecified: Secondary | ICD-10-CM | POA: Diagnosis not present

## 2022-03-26 ENCOUNTER — Encounter: Payer: Self-pay | Admitting: Physical Therapy

## 2022-03-26 ENCOUNTER — Ambulatory Visit: Payer: Medicare Other | Attending: Family Medicine | Admitting: Physical Therapy

## 2022-03-26 DIAGNOSIS — R252 Cramp and spasm: Secondary | ICD-10-CM

## 2022-03-26 DIAGNOSIS — M6281 Muscle weakness (generalized): Secondary | ICD-10-CM

## 2022-03-26 DIAGNOSIS — R262 Difficulty in walking, not elsewhere classified: Secondary | ICD-10-CM

## 2022-03-26 DIAGNOSIS — M25551 Pain in right hip: Secondary | ICD-10-CM | POA: Diagnosis not present

## 2022-03-26 DIAGNOSIS — R293 Abnormal posture: Secondary | ICD-10-CM | POA: Diagnosis not present

## 2022-03-26 DIAGNOSIS — M62838 Other muscle spasm: Secondary | ICD-10-CM

## 2022-03-26 DIAGNOSIS — M545 Low back pain, unspecified: Secondary | ICD-10-CM

## 2022-03-26 DIAGNOSIS — M25552 Pain in left hip: Secondary | ICD-10-CM

## 2022-03-26 NOTE — Therapy (Signed)
[place]. [place], [place], [address] Phone: [phone]   Fax:  [phone]  Physical Therapy Treatment  Patient Details  Name: Lynn Kim MRN: [id number] Date of Birth: [birth year] Referring Provider (PT): Dema Severin   Encounter Date: 03/26/2022   PT End of Session - 03/26/22 1450     Visit Number 6    Date for PT Re-Evaluation 04/25/22    Authorization Type UHC MCR    PT Start Time 3536    PT Stop Time 1443    PT Time Calculation (min) 45 min    Activity Tolerance Patient tolerated treatment well    Behavior During Therapy WFL for tasks assessed/performed             Past Medical History:  Diagnosis Date   Anxiety    PAST HISTORY   Arthritis    bilateral hands   Depression    genetic gene   Heart murmur    CHILDHOOD FUNCTIONAL HEART MURMUR   Osteoporosis    Umbilical hernia    Vertigo     Past Surgical History:  Procedure Laterality Date   APPENDECTOMY     BACK SURGERY     BREAST EXCISIONAL BIOPSY     BUNIONECTOMY     COLONOSCOPY     HYSTEROSCOPY WITH D & C N/A 06/15/2014   Procedure: DILATATION AND CURETTAGE /HYSTEROSCOPY;  Surgeon: Annalee Genta, DO;  Location: [facility];  Service: Gynecology;  Laterality: N/A;  w/Polypectomy   left breast cyst removal     TONSILLECTOMY     UMBILICAL HERNIA REPAIR N/A 09/19/2020   Procedure: OPEN UMBILICAL HERNIA REPAIR;  Surgeon: Dwan Bolt, MD;  Location: [facility];  Service: General;  Laterality: N/A;    There were no vitals filed for this visit.   Subjective Assessment - 03/26/22 1451     Subjective Patient reports a lot of anxiety with some medical thing scoming up, reports some increased abdominal and rib pain on the left, some hip pain    Currently in Pain? Yes    Pain Score 7     Pain Location Hip   left distal rib area   Aggravating Factors  twisting, stairs                                [facility] Adult PT Treatment/Exercise - 03/26/22 0001       Moist Heat Therapy   Number Minutes Moist Heat 10 Minutes    Moist Heat Location Hip;Lumbar Spine      Electrical Stimulation   Electrical Stimulation Location back/hip    Electrical Stimulation Action IFC    Electrical Stimulation Parameters supine    Electrical Stimulation Goals Pain      Manual Therapy   Manual Therapy Soft tissue mobilization    Manual therapy comments passive stretch to the LE's                       PT Short Term Goals - 01/27/22 1521       PT SHORT TERM GOAL #1   Title pt to be I with HEP    Status Achieved               PT Long Term Goals - 03/26/22 1524       PT LONG TERM GOAL #1   Title pt to be  I with advanced HEP    Status Partially Met      PT LONG TERM GOAL #2   Title Pt to report no more than 0/10 pain  at night in the back and the hips    Status Partially Met      PT LONG TERM GOAL #3   Title sit in chair to watch a movie without having to get up due to pain and discomfort    Status Partially Met      PT LONG TERM GOAL #4   Title increase SLR to 90 degrees    Status Achieved                   Plan - 03/26/22 1525     Clinical Impression Statement Patient has been doing well, she is back to her exercise classes and doing more around the house, she reports that she had a flare up of pain recently with some twisting motions, now with an increase of hip and some rib pain, worked on flexibility and mobility as she does a lot of strength on her own at the gym    PT Next Visit Plan see how the ribs and hip are doing try to get her feeling better    Consulted and Agree with Plan of Care Patient             Patient will benefit from skilled therapeutic intervention in order to improve the following deficits and impairments:  Increased fascial restricitons, Increased muscle spasms, Improper body mechanics, Postural dysfunction, Decreased mobility,  Impaired flexibility, Decreased scar mobility, Decreased range of motion, Pain  Visit Diagnosis: Abnormal posture  Acute bilateral low back pain without sciatica  Muscle weakness (generalized)  Difficulty in walking, not elsewhere classified  Other muscle spasm  Pain in right hip  Cramp and spasm  Pain in left hip     Problem List Patient Active Problem List   Diagnosis Date Noted   Chest pain 11/29/2014    Sumner Boast, PT 03/26/2022, 3:27 PM  [place]. [place], [place], [address] Phone: [phone]   Fax:  [phone]  Name: Lynn Kim MRN: [id number] Date of Birth: 1941-04-18

## 2022-04-10 ENCOUNTER — Ambulatory Visit: Payer: Medicare Other | Attending: Family Medicine | Admitting: Physical Therapy

## 2022-04-10 ENCOUNTER — Encounter: Payer: Self-pay | Admitting: Physical Therapy

## 2022-04-10 DIAGNOSIS — M25552 Pain in left hip: Secondary | ICD-10-CM | POA: Diagnosis not present

## 2022-04-10 DIAGNOSIS — M545 Low back pain, unspecified: Secondary | ICD-10-CM | POA: Insufficient documentation

## 2022-04-10 DIAGNOSIS — M25551 Pain in right hip: Secondary | ICD-10-CM | POA: Diagnosis not present

## 2022-04-10 DIAGNOSIS — R262 Difficulty in walking, not elsewhere classified: Secondary | ICD-10-CM | POA: Diagnosis not present

## 2022-04-10 DIAGNOSIS — M62838 Other muscle spasm: Secondary | ICD-10-CM | POA: Diagnosis not present

## 2022-04-10 DIAGNOSIS — R293 Abnormal posture: Secondary | ICD-10-CM | POA: Diagnosis not present

## 2022-04-10 DIAGNOSIS — M6281 Muscle weakness (generalized): Secondary | ICD-10-CM | POA: Diagnosis not present

## 2022-04-10 NOTE — Therapy (Signed)
[place]. [place], [place], [address] Phone: [phone]   Fax:  [phone]  Physical Therapy Treatment  Patient Details  Name: Lynn Kim MRN: [id number] Date of Birth: [birth year] Referring Provider (PT): Dema Severin   Encounter Date: 04/10/2022   PT End of Session - 04/10/22 1842     Visit Number 7    Date for PT Re-Evaluation 04/25/22    Authorization Type UHC MCR    PT Start Time 1800    PT Stop Time 1050    PT Time Calculation (min) 1010 min    Activity Tolerance Patient tolerated treatment well    Behavior During Therapy WFL for tasks assessed/performed             Past Medical History:  Diagnosis Date   Anxiety    PAST HISTORY   Arthritis    bilateral hands   Depression    genetic gene   Heart murmur    CHILDHOOD FUNCTIONAL HEART MURMUR   Osteoporosis    Umbilical hernia    Vertigo     Past Surgical History:  Procedure Laterality Date   APPENDECTOMY     BACK SURGERY     BREAST EXCISIONAL BIOPSY     BUNIONECTOMY     COLONOSCOPY     HYSTEROSCOPY WITH D & C N/A 06/15/2014   Procedure: DILATATION AND CURETTAGE /HYSTEROSCOPY;  Surgeon: Annalee Genta, DO;  Location: [facility];  Service: Gynecology;  Laterality: N/A;  w/Polypectomy   left breast cyst removal     TONSILLECTOMY     UMBILICAL HERNIA REPAIR N/A 09/19/2020   Procedure: OPEN UMBILICAL HERNIA REPAIR;  Surgeon: Dwan Bolt, MD;  Location: [facility];  Service: General;  Laterality: N/A;    There were no vitals filed for this visit.   Subjective Assessment - 04/10/22 1842     Subjective Reports that she is doing okay, is still having the treatments that are causing some anxiety, she has some abdominal pain and some left quadratus pain and tightness    Currently in Pain? Yes    Pain Score 6     Pain Location Back    Pain Orientation Lower;Left    Pain Descriptors / Indicators Sore;Spasm;Tightness                                [facility] Adult PT Treatment/Exercise - 04/10/22 0001       Manual Therapy   Manual Therapy Soft tissue mobilization    Manual therapy comments passive stretch to the LE's    Soft tissue mobilization quads, hip flexors and ITB bilaterally, left quadratus lumborum                       PT Short Term Goals - 01/27/22 1521       PT SHORT TERM GOAL #1   Title pt to be I with HEP    Status Achieved               PT Long Term Goals - 04/10/22 1843       PT LONG TERM GOAL #1   Title pt to be I with advanced HEP    Status Partially Met      PT LONG TERM GOAL #2   Title Pt to report no more than 0/10 pain  at night in the back and the  hips    Status Partially Met      PT LONG TERM GOAL #3   Title sit in chair to watch a movie without having to get up due to pain and discomfort    Status Achieved                   Plan - 04/10/22 1844     Clinical Impression Statement Patient with a little more distal rib and left lumbar pain, she was very tight and tender in the left quadratus lumborum mm, I gave her a print out of child's pose with rotation to stretch this area, she was able to do and felt like it got the spot    PT Next Visit Plan continue to work on the [employer] and Agree with Plan of Care Patient             Patient will benefit from skilled therapeutic intervention in order to improve the following deficits and impairments:  Increased fascial restricitons, Increased muscle spasms, Improper body mechanics, Postural dysfunction, Decreased mobility, Impaired flexibility, Decreased scar mobility, Decreased range of motion, Pain  Visit Diagnosis: Abnormal posture  Acute bilateral low back pain without sciatica  Muscle weakness (generalized)  Difficulty in walking, not elsewhere classified  Other muscle spasm  Pain in right hip     Problem List Patient Active Problem List   Diagnosis  Date Noted   Chest pain 11/29/2014    Sumner Boast, PT 04/10/2022, 6:47 PM  [place]. [place], [place], [address] Phone: [phone]   Fax:  [phone]  Name: ERMALEE MEALY MRN: [id number] Date of Birth: September 02, 1941

## 2022-04-24 ENCOUNTER — Encounter: Payer: Self-pay | Admitting: Physical Therapy

## 2022-04-24 ENCOUNTER — Ambulatory Visit: Payer: Medicare Other | Admitting: Physical Therapy

## 2022-04-24 DIAGNOSIS — M25552 Pain in left hip: Secondary | ICD-10-CM

## 2022-04-24 DIAGNOSIS — M545 Low back pain, unspecified: Secondary | ICD-10-CM

## 2022-04-24 DIAGNOSIS — M62838 Other muscle spasm: Secondary | ICD-10-CM | POA: Diagnosis not present

## 2022-04-24 DIAGNOSIS — M25551 Pain in right hip: Secondary | ICD-10-CM

## 2022-04-24 DIAGNOSIS — M6281 Muscle weakness (generalized): Secondary | ICD-10-CM

## 2022-04-24 DIAGNOSIS — R262 Difficulty in walking, not elsewhere classified: Secondary | ICD-10-CM

## 2022-04-24 DIAGNOSIS — R293 Abnormal posture: Secondary | ICD-10-CM

## 2022-04-24 NOTE — Therapy (Signed)
[place]. [place], [place], [address] Phone: [phone]   Fax:  [phone]  Physical Therapy Treatment  Patient Details  Name: Lynn Kim MRN: [id number] Date of Birth: 12-05-1940 Referring Provider (PT): Dema Severin   Encounter Date: 04/24/2022   PT End of Session - 04/24/22 1838     Visit Number 8    Date for PT Re-Evaluation 04/25/22    Authorization Type UHC MCR    PT Start Time 9417    PT Stop Time 1845    PT Time Calculation (min) 50 min    Activity Tolerance Patient tolerated treatment well    Behavior During Therapy [facility] for tasks assessed/performed             Past Medical History:  Diagnosis Date   Anxiety    PAST HISTORY   Arthritis    bilateral hands   Depression    genetic gene   Heart murmur    CHILDHOOD FUNCTIONAL HEART MURMUR   Osteoporosis    Umbilical hernia    Vertigo     Past Surgical History:  Procedure Laterality Date   APPENDECTOMY     BACK SURGERY     BREAST EXCISIONAL BIOPSY     BUNIONECTOMY     COLONOSCOPY     HYSTEROSCOPY WITH D & C N/A 06/15/2014   Procedure: DILATATION AND CURETTAGE /HYSTEROSCOPY;  Surgeon: Annalee Genta, DO;  Location: [facility];  Service: Gynecology;  Laterality: N/A;  w/Polypectomy   left breast cyst removal     TONSILLECTOMY     UMBILICAL HERNIA REPAIR N/A 09/19/2020   Procedure: OPEN UMBILICAL HERNIA REPAIR;  Surgeon: Dwan Bolt, MD;  Location: [facility];  Service: General;  Laterality: N/A;    There were no vitals filed for this visit.   Subjective Assessment - 04/24/22 1838     Subjective Patient continues to report improvements but still some pain and difficulty with her normal ADL's and exercises    Currently in Pain? Yes    Pain Score 4     Pain Location Hip    Pain Orientation Right;Left    Pain Descriptors / Indicators Spasm;Tightness    Aggravating Factors  exercise class    Pain Relieving Factors  treatment, stretches                               [facility] Adult PT Treatment/Exercise - 04/24/22 0001       Moist Heat Therapy   Number Minutes Moist Heat 10 Minutes    Moist Heat Location Hip;Lumbar Spine      Electrical Stimulation   Electrical Stimulation Location back/hip    Electrical Stimulation Action IFC    Electrical Stimulation Parameters supine    Electrical Stimulation Goals Pain      Manual Therapy   Manual Therapy Soft tissue mobilization    Manual therapy comments passive stretch to the LE's    Soft tissue mobilization quads, hip flexors and ITB bilaterally, left quadratus lumborum                       PT Short Term Goals - 01/27/22 1521       PT SHORT TERM GOAL #1   Title pt to be I with HEP    Status Achieved  PT Long Term Goals - 04/24/22 1839       PT LONG TERM GOAL #1   Title pt to be I with advanced HEP    Status Partially Met      PT LONG TERM GOAL #2   Title Pt to report no more than 0/10 pain  at night in the back and the hips    Status Partially Met      PT LONG TERM GOAL #3   Title sit in chair to watch a movie without having to get up due to pain and discomfort    Status Achieved                   Plan - 04/24/22 1841     Clinical Impression Statement Patient continues to improve and overall she is reporting less pain, she went for a long drive and then did some hiking with her daughter the past week, she is having a little more hip pain bilaterally, she was very tight in the ITB, the hip flexor and the adductors, we talked about stretches as she is reporting some difficulty due to the harrington rods, tried to problem solve with her about this    PT Next Visit Plan will do a renewal next visit probably seeing her every other week    Consulted and Agree with Plan of Care Patient             Patient will benefit from skilled therapeutic intervention in order to improve  the following deficits and impairments:  Increased fascial restricitons, Increased muscle spasms, Improper body mechanics, Postural dysfunction, Decreased mobility, Impaired flexibility, Decreased scar mobility, Decreased range of motion, Pain  Visit Diagnosis: Abnormal posture  Acute bilateral low back pain without sciatica  Muscle weakness (generalized)  Difficulty in walking, not elsewhere classified  Other muscle spasm  Pain in right hip  Pain in left hip     Problem List Patient Active Problem List   Diagnosis Date Noted   Chest pain 11/29/2014    Sumner Boast, PT 04/24/2022, 6:42 PM  [place]. [place], [place], [address] Phone: [phone]   Fax:  [phone]  Name: Lynn Kim MRN: [id number] Date of Birth: November 10, 1940

## 2022-05-15 ENCOUNTER — Encounter: Payer: Self-pay | Admitting: Physical Therapy

## 2022-05-15 ENCOUNTER — Ambulatory Visit: Payer: Medicare Other | Attending: Family Medicine | Admitting: Physical Therapy

## 2022-05-15 DIAGNOSIS — M545 Low back pain, unspecified: Secondary | ICD-10-CM | POA: Insufficient documentation

## 2022-05-15 DIAGNOSIS — M62838 Other muscle spasm: Secondary | ICD-10-CM | POA: Diagnosis not present

## 2022-05-15 DIAGNOSIS — M25552 Pain in left hip: Secondary | ICD-10-CM | POA: Diagnosis not present

## 2022-05-15 DIAGNOSIS — M6281 Muscle weakness (generalized): Secondary | ICD-10-CM | POA: Diagnosis not present

## 2022-05-15 DIAGNOSIS — M25551 Pain in right hip: Secondary | ICD-10-CM | POA: Insufficient documentation

## 2022-05-15 DIAGNOSIS — R252 Cramp and spasm: Secondary | ICD-10-CM | POA: Diagnosis not present

## 2022-05-15 DIAGNOSIS — R262 Difficulty in walking, not elsewhere classified: Secondary | ICD-10-CM | POA: Insufficient documentation

## 2022-05-15 DIAGNOSIS — R293 Abnormal posture: Secondary | ICD-10-CM | POA: Diagnosis not present

## 2022-05-15 NOTE — Therapy (Signed)
[place]. [place], [place], [address] Phone: [phone]   Fax:  [phone]  Physical Therapy Treatment  Patient Details  Name: Lynn Kim MRN: [id number] Date of Birth: [birth year] Referring Provider (PT): Dema Severin   Encounter Date: 05/15/2022   PT End of Session - 05/15/22 1834     Visit Number 9    Date for PT Re-Evaluation 06/15/22    Authorization Type UHC MCR    PT Start Time 1800    PT Stop Time 1850    PT Time Calculation (min) 50 min    Activity Tolerance Patient tolerated treatment well    Behavior During Therapy WFL for tasks assessed/performed             Past Medical History:  Diagnosis Date   Anxiety    PAST HISTORY   Arthritis    bilateral hands   Depression    genetic gene   Heart murmur    CHILDHOOD FUNCTIONAL HEART MURMUR   Osteoporosis    Umbilical hernia    Vertigo     Past Surgical History:  Procedure Laterality Date   APPENDECTOMY     BACK SURGERY     BREAST EXCISIONAL BIOPSY     BUNIONECTOMY     COLONOSCOPY     HYSTEROSCOPY WITH D & C N/A 06/15/2014   Procedure: DILATATION AND CURETTAGE /HYSTEROSCOPY;  Surgeon: Annalee Genta, DO;  Location: [facility];  Service: Gynecology;  Laterality: N/A;  w/Polypectomy   left breast cyst removal     TONSILLECTOMY     UMBILICAL HERNIA REPAIR N/A 09/19/2020   Procedure: OPEN UMBILICAL HERNIA REPAIR;  Surgeon: Dwan Bolt, MD;  Location: [facility];  Service: General;  Laterality: N/A;    There were no vitals filed for this visit.   Subjective Assessment - 05/15/22 1835     Subjective We have continued to space out appointments, she reports taht she is improving, less spasms in the abdomen and rib cage area, feeling stronger, but today having some right hip pain    Currently in Pain? Yes    Pain Score 6     Pain Location Hip    Pain Orientation Right    Pain Descriptors / Indicators Aching;Sore;Spasm     Aggravating Factors  activity    Pain Relieving Factors stretches and treatment                               [facility] Adult PT Treatment/Exercise - 05/15/22 0001       Manual Therapy   Manual Therapy Soft tissue mobilization    Manual therapy comments passive stretch to the LE's    Soft tissue mobilization quads, hip flexors and ITB bilaterally, left quadratus lumborum, some passive right hip distraction                       PT Short Term Goals - 01/27/22 1521       PT SHORT TERM GOAL #1   Title pt to be I with HEP    Status Achieved               PT Long Term Goals - 05/15/22 1836       PT LONG TERM GOAL #1   Title pt to be I with advanced HEP    Status Partially Met  PT LONG TERM GOAL #2   Title Pt to report no more than 0/10 pain  at night in the back and the hips    Status Partially Met      PT LONG TERM GOAL #3   Title sit in chair to watch a movie without having to get up due to pain and discomfort    Status Achieved      PT LONG TERM GOAL #4   Title increase SLR to 90 degrees    Status Achieved                   Plan - 05/15/22 1837     Clinical Impression Statement WE are spacing out visits and trying to have her do more and more exercises she feels that she is getting better and doing more, today has c/o right hip stiff and sore, I dis some manual distraction and spent more time on  the passive stretches of the LE.  She is able to go to her exercise class and for the most part do everything , less pain and difficulty, still at times some difficulty getting up from low seat    PT Frequency 1x / week    PT Duration 8 weeks    PT Treatment/Interventions ADLs/Self Care Home Management;Aquatic Therapy;Functional mobility training;Therapeutic activities;Therapeutic exercise;Ultrasound;Cryotherapy;Moist Heat;Neuromuscular re-education;Manual techniques;Patient/family education;Scar mobilization;Passive range of  motion;Dry needling;Energy conservation;Taping    PT Next Visit Plan space out treatments as she does more and more on her own    Consulted and Agree with Plan of Care Patient             Patient will benefit from skilled therapeutic intervention in order to improve the following deficits and impairments:  Increased fascial restricitons, Increased muscle spasms, Improper body mechanics, Postural dysfunction, Decreased mobility, Impaired flexibility, Decreased scar mobility, Decreased range of motion, Pain  Visit Diagnosis: Abnormal posture  Acute bilateral low back pain without sciatica  Muscle weakness (generalized)  Difficulty in walking, not elsewhere classified  Other muscle spasm  Pain in right hip  Pain in left hip  Cramp and spasm     Problem List Patient Active Problem List   Diagnosis Date Noted   Chest pain 11/29/2014    Sumner Boast, PT 05/15/2022, 6:39 PM  [place]. [place], [place], [address] Phone: [phone]   Fax:  [phone]  Name: Lynn Kim MRN: [id number] Date of Birth: 23-Dec-1940

## 2022-05-20 DIAGNOSIS — R1033 Periumbilical pain: Secondary | ICD-10-CM | POA: Diagnosis not present

## 2022-05-27 ENCOUNTER — Encounter: Payer: Self-pay | Admitting: Physical Therapy

## 2022-05-27 ENCOUNTER — Ambulatory Visit: Payer: Medicare Other | Admitting: Physical Therapy

## 2022-05-27 ENCOUNTER — Other Ambulatory Visit (HOSPITAL_COMMUNITY): Payer: Self-pay | Admitting: Surgery

## 2022-05-27 DIAGNOSIS — M545 Low back pain, unspecified: Secondary | ICD-10-CM | POA: Diagnosis not present

## 2022-05-27 DIAGNOSIS — R252 Cramp and spasm: Secondary | ICD-10-CM | POA: Diagnosis not present

## 2022-05-27 DIAGNOSIS — M6281 Muscle weakness (generalized): Secondary | ICD-10-CM | POA: Diagnosis not present

## 2022-05-27 DIAGNOSIS — M25552 Pain in left hip: Secondary | ICD-10-CM | POA: Diagnosis not present

## 2022-05-27 DIAGNOSIS — M62838 Other muscle spasm: Secondary | ICD-10-CM

## 2022-05-27 DIAGNOSIS — M25551 Pain in right hip: Secondary | ICD-10-CM | POA: Diagnosis not present

## 2022-05-27 DIAGNOSIS — R293 Abnormal posture: Secondary | ICD-10-CM | POA: Diagnosis not present

## 2022-05-27 DIAGNOSIS — R262 Difficulty in walking, not elsewhere classified: Secondary | ICD-10-CM

## 2022-05-27 NOTE — Therapy (Signed)
[place]. [place], [place], [address] Phone: [phone]   Fax:  [phone]  Physical Therapy Treatment Progress Note Reporting Period 12/24/21 to 05/27/22  See note below for Objective Data and Assessment of Progress/Goals.     Patient Details  Name: MIXTLI RENO MRN: [id number] Date of Birth: [birth year] Referring Provider (PT): Dema Severin   Encounter Date: 05/27/2022   PT End of Session - 05/27/22 1844     Visit Number 10    Date for PT Re-Evaluation 06/15/22    Authorization Type UHC MCR    PT Start Time 1800    PT Stop Time 1850    PT Time Calculation (min) 50 min    Activity Tolerance Patient tolerated treatment well    Behavior During Therapy WFL for tasks assessed/performed             Past Medical History:  Diagnosis Date   Anxiety    PAST HISTORY   Arthritis    bilateral hands   Depression    genetic gene   Heart murmur    CHILDHOOD FUNCTIONAL HEART MURMUR   Osteoporosis    Umbilical hernia    Vertigo     Past Surgical History:  Procedure Laterality Date   APPENDECTOMY     BACK SURGERY     BREAST EXCISIONAL BIOPSY     BUNIONECTOMY     COLONOSCOPY     HYSTEROSCOPY WITH D & C N/A 06/15/2014   Procedure: DILATATION AND CURETTAGE /HYSTEROSCOPY;  Surgeon: Annalee Genta, DO;  Location: [facility];  Service: Gynecology;  Laterality: N/A;  w/Polypectomy   left breast cyst removal     TONSILLECTOMY     UMBILICAL HERNIA REPAIR N/A 09/19/2020   Procedure: OPEN UMBILICAL HERNIA REPAIR;  Surgeon: Dwan Bolt, MD;  Location: [facility];  Service: General;  Laterality: N/A;    There were no vitals filed for this visit.   Subjective Assessment - 05/27/22 1844     Subjective patient doing well has questions about exercises and fatigue    Currently in Pain? Yes    Pain Score 4     Pain Location Hip    Pain Orientation Right;Left    Pain Descriptors / Indicators  Sore;Tiring    Aggravating Factors  stairs, exercises                               [facility] Adult PT Treatment/Exercise - 05/27/22 0001       Self-Care   Other Self-Care Comments  went over HEP and active recovery and rest days, to take breaks, reviewed her current program and other activities, gave guidance      Manual Therapy   Manual Therapy Soft tissue mobilization    Manual therapy comments passive stretch to the LE's    Soft tissue mobilization quads, hip flexors and ITB bilaterally, left quadratus lumborum, some passive right hip distraction                       PT Short Term Goals - 01/27/22 1521       PT SHORT TERM GOAL #1   Title pt to be I with HEP    Status Achieved               PT Long Term Goals - 05/27/22 1845       PT LONG  TERM GOAL #1   Title pt to be I with advanced HEP    Status Partially Met      PT LONG TERM GOAL #2   Title Pt to report no more than 0/10 pain  at night in the back and the hips    Status Partially Met      PT LONG TERM GOAL #3   Title sit in chair to watch a movie without having to get up due to pain and discomfort    Status Achieved      PT LONG TERM GOAL #4   Title increase SLR to 90 degrees    Status Achieved                   Plan - 05/27/22 1845     Clinical Impression Statement Doing well overall, she had a lot of questions about the progression of exercises and feelings of fatigue, we talked about taking breaks and days off to allow recovery of mms, we reviewed her current program for exercise and feel like she is doing well, just needs some slow days to allow active recovery.    PT Next Visit Plan space out treatments as she does more and more on her own    Consulted and Agree with Plan of Care Patient             Patient will benefit from skilled therapeutic intervention in order to improve the following deficits and impairments:  Increased fascial restricitons,  Increased muscle spasms, Improper body mechanics, Postural dysfunction, Decreased mobility, Impaired flexibility, Decreased scar mobility, Decreased range of motion, Pain  Visit Diagnosis: Abnormal posture  Acute bilateral low back pain without sciatica  Muscle weakness (generalized)  Difficulty in walking, not elsewhere classified  Other muscle spasm  Pain in right hip  Pain in left hip     Problem List Patient Active Problem List   Diagnosis Date Noted   Chest pain 11/29/2014    Sumner Boast, PT 05/27/2022, 6:50 PM  [place]. [place], [place], [address] Phone: [phone]   Fax:  [phone]  Name: BLIMIE VANESS MRN: [id number] Date of Birth: [birth year]

## 2022-05-28 ENCOUNTER — Other Ambulatory Visit: Payer: Self-pay | Admitting: Surgery

## 2022-05-28 ENCOUNTER — Other Ambulatory Visit (HOSPITAL_COMMUNITY): Payer: Self-pay | Admitting: Surgery

## 2022-05-28 DIAGNOSIS — R1033 Periumbilical pain: Secondary | ICD-10-CM

## 2022-06-05 ENCOUNTER — Ambulatory Visit (HOSPITAL_COMMUNITY)
Admission: RE | Admit: 2022-06-05 | Discharge: 2022-06-05 | Disposition: A | Discharge status: Home / Self Care | Payer: Medicare Other | Source: Ambulatory Visit | Attending: Surgery | Admitting: Surgery

## 2022-06-05 DIAGNOSIS — R1033 Periumbilical pain: Secondary | ICD-10-CM | POA: Insufficient documentation

## 2022-06-05 DIAGNOSIS — R109 Unspecified abdominal pain: Secondary | ICD-10-CM | POA: Diagnosis not present

## 2022-06-05 DIAGNOSIS — I700 Atherosclerosis of aorta: Secondary | ICD-10-CM | POA: Diagnosis not present

## 2022-06-10 ENCOUNTER — Ambulatory Visit: Payer: Medicare Other | Attending: Family Medicine | Admitting: Physical Therapy

## 2022-06-10 ENCOUNTER — Encounter: Payer: Self-pay | Admitting: Physical Therapy

## 2022-06-10 DIAGNOSIS — M6281 Muscle weakness (generalized): Secondary | ICD-10-CM | POA: Insufficient documentation

## 2022-06-10 DIAGNOSIS — M25552 Pain in left hip: Secondary | ICD-10-CM | POA: Diagnosis not present

## 2022-06-10 DIAGNOSIS — M545 Low back pain, unspecified: Secondary | ICD-10-CM | POA: Insufficient documentation

## 2022-06-10 DIAGNOSIS — R252 Cramp and spasm: Secondary | ICD-10-CM | POA: Diagnosis not present

## 2022-06-10 DIAGNOSIS — R262 Difficulty in walking, not elsewhere classified: Secondary | ICD-10-CM | POA: Diagnosis not present

## 2022-06-10 DIAGNOSIS — R293 Abnormal posture: Secondary | ICD-10-CM | POA: Diagnosis not present

## 2022-06-10 DIAGNOSIS — M62838 Other muscle spasm: Secondary | ICD-10-CM | POA: Diagnosis not present

## 2022-06-10 DIAGNOSIS — M25551 Pain in right hip: Secondary | ICD-10-CM | POA: Insufficient documentation

## 2022-06-10 NOTE — Therapy (Signed)
[facility]. [place], [place], [address] Phone: [phone]   Fax:  [phone]  Physical Therapy Treatment  Patient Details  Name: Lynn Kim MRN: [id number] Date of Birth: 03/23/1941 Referring Provider (PT): Dema Severin   Encounter Date: 06/10/2022   PT End of Session - 06/10/22 1758     Visit Number 11    Date for PT Re-Evaluation 06/15/22             Past Medical History:  Diagnosis Date   Anxiety    PAST HISTORY   Arthritis    bilateral hands   Depression    genetic gene   Heart murmur    CHILDHOOD FUNCTIONAL HEART MURMUR   Osteoporosis    Umbilical hernia    Vertigo     Past Surgical History:  Procedure Laterality Date   APPENDECTOMY     BACK SURGERY     BREAST EXCISIONAL BIOPSY     BUNIONECTOMY     COLONOSCOPY     HYSTEROSCOPY WITH D & C N/A 06/15/2014   Procedure: DILATATION AND CURETTAGE /HYSTEROSCOPY;  Surgeon: Annalee Genta, DO;  Location: [facility];  Service: Gynecology;  Laterality: N/A;  w/Polypectomy   left breast cyst removal     TONSILLECTOMY     UMBILICAL HERNIA REPAIR N/A 09/19/2020   Procedure: OPEN UMBILICAL HERNIA REPAIR;  Surgeon: Dwan Bolt, MD;  Location: [facility];  Service: General;  Laterality: N/A;    There were no vitals filed for this visit.   Subjective Assessment - 06/10/22 1838     Subjective Reports had a CT scan, doing well, no big issues, the back and the hips are feeling stronger    Currently in Pain? Yes    Pain Score 3     Pain Location Hip    Pain Orientation Right;Left    Pain Relieving Factors this treatment helps                               [facility] Adult PT Treatment/Exercise - 06/10/22 0001       Lumbar Exercises: Stretches   Passive Hamstring Stretch Right;Left;4 reps;20 seconds    Single Knee to Chest Stretch Right;Left;4 reps;10 seconds    Lower Trunk Rotation 4 reps;10 seconds    Quad Stretch  Right;Left;4 reps;20 seconds    ITB Stretch Right;Left;4 reps;20 seconds    Piriformis Stretch Right;Left;4 reps;20 seconds    Other Lumbar Stretch Exercise adductor stretch      Lumbar Exercises: Supine   Ab Set 15 reps    Pelvic Tilt 15 reps    Glut Set 15 reps    Bridge with [employer] 20 reps;1 second    Bridge with clamshell 20 reps;1 second    Other Supine Lumbar Exercises feet on ball K2C, trunk rotation, small bridges, isometric      Manual Therapy   Soft tissue mobilization quads, hip flexors and ITB bilaterally, left quadratus lumborum, some passive right hip distraction                       PT Short Term Goals - 01/27/22 1521       PT SHORT TERM GOAL #1   Title pt to be I with HEP    Status Achieved               PT Long Term  Goals - 06/10/22 1840       PT LONG TERM GOAL #1   Title pt to be I with advanced HEP    Status Partially Met                   Plan - 06/10/22 1840     Clinical Impression Statement Patient reports progressing some of her exercises at the gym and at home, she has a hernia and we talked about how to helpo with this with her exercises and how to avoid further damage with a sheet wrap or brace with isometric type activities    PT Next Visit Plan space out treatments as she does more and more on her own    Consulted and Agree with Plan of Care Patient             Patient will benefit from skilled therapeutic intervention in order to improve the following deficits and impairments:  Increased fascial restricitons, Increased muscle spasms, Improper body mechanics, Postural dysfunction, Decreased mobility, Impaired flexibility, Decreased scar mobility, Decreased range of motion, Pain  Visit Diagnosis: Abnormal posture  Acute bilateral low back pain without sciatica  Muscle weakness (generalized)  Difficulty in walking, not elsewhere classified  Other muscle spasm  Pain in right hip  Pain in left  hip  Cramp and spasm     Problem List Patient Active Problem List   Diagnosis Date Noted   Chest pain 11/29/2014    Sumner Boast, PT 06/10/2022, 6:43 PM  [facility]. [place], [place], [address] Phone: [phone]   Fax:  [phone]  Name: Lynn Kim MRN: [id number] Date of Birth: [birth year]

## 2022-06-24 ENCOUNTER — Encounter: Payer: Self-pay | Admitting: Physical Therapy

## 2022-06-24 ENCOUNTER — Ambulatory Visit: Payer: Medicare Other | Admitting: Physical Therapy

## 2022-06-24 DIAGNOSIS — M6281 Muscle weakness (generalized): Secondary | ICD-10-CM | POA: Diagnosis not present

## 2022-06-24 DIAGNOSIS — M25552 Pain in left hip: Secondary | ICD-10-CM | POA: Diagnosis not present

## 2022-06-24 DIAGNOSIS — M25551 Pain in right hip: Secondary | ICD-10-CM

## 2022-06-24 DIAGNOSIS — M62838 Other muscle spasm: Secondary | ICD-10-CM

## 2022-06-24 DIAGNOSIS — R293 Abnormal posture: Secondary | ICD-10-CM

## 2022-06-24 DIAGNOSIS — R262 Difficulty in walking, not elsewhere classified: Secondary | ICD-10-CM | POA: Diagnosis not present

## 2022-06-24 DIAGNOSIS — M545 Low back pain, unspecified: Secondary | ICD-10-CM

## 2022-06-24 DIAGNOSIS — R252 Cramp and spasm: Secondary | ICD-10-CM | POA: Diagnosis not present

## 2022-06-24 NOTE — Therapy (Signed)
[facility]. [place], [place], [address] Phone: [phone]   Fax:  [phone]  Physical Therapy Treatment  Patient Details  Name: Lynn Kim MRN: [id number] Date of Birth: 01-04-1941 Referring Provider (PT): Dema Severin   Encounter Date: 06/24/2022   PT End of Session - 06/24/22 1145     Visit Number 12    Date for PT Re-Evaluation --    Authorization Type UHC MCR    PT Start Time 1100    PT Stop Time 1150    PT Time Calculation (min) 50 min    Activity Tolerance Patient tolerated treatment well    Behavior During Therapy WFL for tasks assessed/performed             Past Medical History:  Diagnosis Date   Anxiety    PAST HISTORY   Arthritis    bilateral hands   Depression    genetic gene   Heart murmur    CHILDHOOD FUNCTIONAL HEART MURMUR   Osteoporosis    Umbilical hernia    Vertigo     Past Surgical History:  Procedure Laterality Date   APPENDECTOMY     BACK SURGERY     BREAST EXCISIONAL BIOPSY     BUNIONECTOMY     COLONOSCOPY     HYSTEROSCOPY WITH D & C N/A 06/15/2014   Procedure: DILATATION AND CURETTAGE /HYSTEROSCOPY;  Surgeon: Annalee Genta, DO;  Location: [facility];  Service: Gynecology;  Laterality: N/A;  w/Polypectomy   left breast cyst removal     TONSILLECTOMY     UMBILICAL HERNIA REPAIR N/A 09/19/2020   Procedure: OPEN UMBILICAL HERNIA REPAIR;  Surgeon: Dwan Bolt, MD;  Location: [facility];  Service: General;  Laterality: N/A;    There were no vitals filed for this visit.   Subjective Assessment - 06/24/22 1147     Subjective Having some increased cramps in the left flank area    Currently in Pain? Yes    Pain Score 4     Pain Location Flank    Pain Orientation Left    Pain Descriptors / Indicators Cramping;Spasm    Aggravating Factors  unsure about why the spasm i n the flank    Pain Relieving Factors "treatment always helps"                                [facility] Adult PT Treatment/Exercise - 06/24/22 0001       Lumbar Exercises: Supine   Ab Set 15 reps    Bridge with [employer] 20 reps;1 second    Bridge with clamshell 20 reps;1 second    Other Supine Lumbar Exercises feet on ball K2C, trunk rotation, small bridges, isometric      Manual Therapy   Manual therapy comments passive stretch to the LE's    Soft tissue mobilization quads, hip flexors and ITB bilaterally, left quadratus lumborum, some passive right hip distraction                       PT Short Term Goals - 01/27/22 1521       PT SHORT TERM GOAL #1   Title pt to be I with HEP    Status Achieved               PT Long Term Goals - 06/24/22 1148  PT LONG TERM GOAL #1   Title pt to be I with advanced HEP    Status Achieved      PT LONG TERM GOAL #2   Title Pt to report no more than 0/10 pain  at night in the back and the hips    Status Achieved      PT LONG TERM GOAL #3   Title sit in chair to watch a movie without having to get up due to pain and discomfort    Status Achieved      PT LONG TERM GOAL #4   Title increase SLR to 90 degrees    Status Achieved                   Plan - 06/24/22 1149     Clinical Impression Statement Overall patient reports that she is no longer having much issue with gettin gup and down from chair and the stairs, still some tightness and spasm, having some spasm in the left flank, gave gentle thoracic side bending and some resisted breathing today    PT Frequency 1x / week    PT Duration 8 weeks    PT Treatment/Interventions ADLs/Self Care Home Management;Aquatic Therapy;Functional mobility training;Therapeutic activities;Therapeutic exercise;Ultrasound;Cryotherapy;Moist Heat;Neuromuscular re-education;Manual techniques;Patient/family education;Scar mobilization;Passive range of motion;Dry needling;Energy conservation;Taping    PT Next Visit Plan she is doing  well and increasing activity on her own, goals met will d/c    Consulted and Agree with Plan of Care Patient             Patient will benefit from skilled therapeutic intervention in order to improve the following deficits and impairments:  Increased fascial restricitons, Increased muscle spasms, Improper body mechanics, Postural dysfunction, Decreased mobility, Impaired flexibility, Decreased scar mobility, Decreased range of motion, Pain  Visit Diagnosis: Abnormal posture  Acute bilateral low back pain without sciatica  Muscle weakness (generalized)  Difficulty in walking, not elsewhere classified  Other muscle spasm  Pain in right hip  Pain in left hip     Problem List Patient Active Problem List   Diagnosis Date Noted   Chest pain 11/29/2014    Sumner Boast, PT 06/24/2022, 12:08 PM  [facility]. [place], [place], [address] Phone: [phone]   Fax:  [phone]  Name: Lynn Kim MRN: [id number] Date of Birth: 07-28-1941

## 2022-08-08 DIAGNOSIS — K429 Umbilical hernia without obstruction or gangrene: Secondary | ICD-10-CM | POA: Diagnosis not present

## 2022-09-12 ENCOUNTER — Other Ambulatory Visit: Payer: Self-pay | Admitting: Family Medicine

## 2022-09-12 DIAGNOSIS — R918 Other nonspecific abnormal finding of lung field: Secondary | ICD-10-CM

## 2022-09-12 DIAGNOSIS — M419 Scoliosis, unspecified: Secondary | ICD-10-CM | POA: Diagnosis not present

## 2022-09-12 DIAGNOSIS — I700 Atherosclerosis of aorta: Secondary | ICD-10-CM | POA: Diagnosis not present

## 2022-10-07 DIAGNOSIS — R5382 Chronic fatigue, unspecified: Secondary | ICD-10-CM | POA: Diagnosis not present

## 2022-10-07 DIAGNOSIS — R799 Abnormal finding of blood chemistry, unspecified: Secondary | ICD-10-CM | POA: Diagnosis not present

## 2022-10-23 ENCOUNTER — Ambulatory Visit
Admission: RE | Admit: 2022-10-23 | Discharge: 2022-10-23 | Disposition: A | Discharge status: Home / Self Care | Payer: Medicare Other | Source: Ambulatory Visit | Attending: Family Medicine | Admitting: Family Medicine

## 2022-10-23 DIAGNOSIS — J9809 Other diseases of bronchus, not elsewhere classified: Secondary | ICD-10-CM | POA: Diagnosis not present

## 2022-10-23 DIAGNOSIS — R918 Other nonspecific abnormal finding of lung field: Secondary | ICD-10-CM | POA: Diagnosis not present

## 2022-10-23 DIAGNOSIS — I700 Atherosclerosis of aorta: Secondary | ICD-10-CM | POA: Diagnosis not present

## 2022-10-23 DIAGNOSIS — J9811 Atelectasis: Secondary | ICD-10-CM | POA: Diagnosis not present

## 2022-10-31 ENCOUNTER — Other Ambulatory Visit: Payer: Self-pay | Admitting: Cardiology

## 2022-10-31 ENCOUNTER — Ambulatory Visit: Payer: Medicare Other | Admitting: Cardiology

## 2022-10-31 ENCOUNTER — Encounter: Payer: Self-pay | Admitting: Cardiology

## 2022-10-31 VITALS — BP 140/71 | HR 74 | Ht 63.0 in | Wt 125.6 lb

## 2022-10-31 DIAGNOSIS — I2510 Atherosclerotic heart disease of native coronary artery without angina pectoris: Secondary | ICD-10-CM | POA: Insufficient documentation

## 2022-10-31 MED ORDER — ROSUVASTATIN CALCIUM 5 MG PO TABS: 5.0000 mg | ORAL_TABLET | ORAL | 1 refills | Status: DC

## 2022-10-31 NOTE — Progress Notes (Signed)
Patient referred by Harlan Stains, MD for coronary atherosclerosis  Subjective:   Lynn Kim, female    DOB: 1941-09-17, 82 y.o.   MRN: [id number]   Chief Complaint  Patient presents with   Coronary Artery Disease   New Patient (Initial Visit)     HPI  82 y.o. Caucasian female with multiple prior spine surgeries, now with incidental finding of coronary artery disease noted on CT chest.  Patient does high intensity exercise regularly without any chest pain, dyspnea. Lipid panel favorable. In spite of that, CAD noted on CT chest.   Past Medical History:  Diagnosis Date   Anxiety    PAST HISTORY   Arthritis    bilateral hands   Depression    genetic gene   Heart murmur    CHILDHOOD FUNCTIONAL HEART MURMUR   Osteoporosis    Umbilical hernia    Vertigo      Past Surgical History:  Procedure Laterality Date   APPENDECTOMY     BACK SURGERY     BREAST EXCISIONAL BIOPSY     BUNIONECTOMY     COLONOSCOPY     HYSTEROSCOPY WITH D & C N/A 06/15/2014   Procedure: DILATATION AND CURETTAGE /HYSTEROSCOPY;  Surgeon: Annalee Genta, DO;  Location: [facility];  Service: Gynecology;  Laterality: N/A;  w/Polypectomy   left breast cyst removal     TONSILLECTOMY     UMBILICAL HERNIA REPAIR N/A 09/19/2020   Procedure: OPEN UMBILICAL HERNIA REPAIR;  Surgeon: Dwan Bolt, MD;  Location: [facility];  Service: General;  Laterality: N/A;     Social History   Tobacco Use  Smoking Status Former  Smokeless Tobacco Former   Quit date: 04/06/1963    Social History   Substance and Sexual Activity  Alcohol Use Not Currently     Family History  Problem Relation Age of Onset   Breast cancer Mother    Asthma Sister       Current Outpatient Medications:    azelastine (ASTELIN) 0.1 % nasal spray, Place 2 sprays into both nostrils 2 (two) times daily., Disp: 30 mL, Rfl: 5   Barberry-Oreg Grape-Goldenseal (BERBERINE COMPLEX PO), Take 500 mg by mouth 2 (two)  times daily., Disp: , Rfl:    CALCIUM-MAGNESIUM-VITAMIN D PO, Take 1 tablet by mouth 2 (two) times daily., Disp: , Rfl:    Calcium-Vitamin D-Vitamin K 500-500-40 MG-UNT-MCG CHEW, Chew 1 tablet by mouth daily with lunch., Disp: , Rfl:    Cholecalciferol (VITAMIN D3) 2000 UNITS TABS, Take 1 tablet by mouth 2 (two) times daily., Disp: , Rfl:    cyanocobalamin 2000 MCG tablet, Take 2,000 mcg by mouth daily., Disp: , Rfl:    Digestive Enzymes (DIGESTIVE ENZYME PO), Take 1 tablet by mouth 2 (two) times daily before lunch and supper., Disp: , Rfl:    escitalopram (LEXAPRO) 5 MG tablet, Take 5 mg by mouth daily., Disp: , Rfl:    guaiFENesin (MUCINEX) 600 MG 12 hr tablet, Take by mouth 2 (two) times daily., Disp: , Rfl:    HYDROcodone-acetaminophen (NORCO/VICODIN) 5-325 MG tablet, Take 1 tablet by mouth every 4 (four) hours as needed for severe pain., Disp: 15 tablet, Rfl: 0   L-Theanine 100 MG CAPS, Take 100 mg by mouth 2 (two) times daily., Disp: , Rfl:    loratadine (CLARITIN) 10 MG tablet, Take 10 mg by mouth daily., Disp: , Rfl:    Menaquinone-7 (VITAMIN K2 PO), Take by mouth., Disp: , Rfl:  Misc Natural Products (T-RELIEF CBD+13 SL), Place under the tongue., Disp: , Rfl:    Nutritional Supplements (DHEA PO), Take 5 mg by mouth daily., Disp: , Rfl:    PRESCRIPTION MEDICATION, Apply 1 application topically daily. Bi-est cream - compounded at [facility], Disp: , Rfl:    PRESCRIPTION MEDICATION, Apply 1 application topically 3 (three) times a week. Estriol - Testosterone Cream - three times weekly (Sunday, Tuesday, Friday) Compounded at [facility], Disp: , Rfl:    PRESCRIPTION MEDICATION, Take 1 capsule by mouth daily. Progesterone SR 100 mg capsules - compounded at [facility], Disp: , Rfl:    Probiotic Product (ALIGN PO), Take 1 capsule by mouth 1 day or 1 dose., Disp: , Rfl:    Turmeric Curcumin 500 MG CAPS, Take 1 capsule by mouth daily., Disp: , Rfl:     Cardiovascular and other pertinent studies:  Reviewed external labs and tests, independently interpreted  EKG 10/31/2022: Sinus rhythm 63 bpm Normal EKG   CT chest 10/23/2022: 1. No definitive imaging findings to suggest interstitial lung disease. 2. 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 Outpatient referral to Pulmonology for further clinical evaluation is recommended. 3. 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 4. Aortic atherosclerosis, in addition to two-vessel coronary artery disease. Assessment for potential risk factor modification, dietary therapy or pharmacologic therapy may be warranted, if clinically indicated.   Aortic Atherosclerosis   Recent labs: 03/18/2022: Glucose 92, BUN/Cr NA/0.66. EGFR 84.  HbA1C 5.9% Chol 156, TG 52, HDL 62, LDL 84   Review of Systems  Cardiovascular:  Negative for chest pain, dyspnea on exertion, leg swelling, palpitations and syncope.         Vitals:   10/31/22 1307 10/31/22 1320  BP: (!) 145/67 (!) 140/71  Pulse: 70 74  SpO2: 96% 95%     Body mass index is 22.25 kg/m. Filed Weights   10/31/22 1307  Weight: 125 lb 9.6 oz (57 kg)     Objective:   Physical Exam Vitals and nursing note reviewed.  Constitutional:      General: She is not in acute distress. Neck:     Vascular: No JVD.  Cardiovascular:     Rate and Rhythm: Normal rate and regular rhythm.     Heart sounds: Normal heart sounds. No murmur heard. Pulmonary:     Effort: Pulmonary effort is normal.     Breath sounds: Normal breath sounds. No wheezing or rales.  Musculoskeletal:     Right lower leg: No edema.     Left lower leg: No edema.          Visit diagnoses:   ICD-10-CM   1. Coronary artery disease involving native coronary artery of native heart without angina pectoris  I25.10 EKG  12-Lead       Orders Placed This Encounter  Procedures   EKG 12-Lead     Medication changes this visit: Medications Discontinued During This Encounter  Medication Reason   Turmeric Curcumin 948 MG CAPS Duplicate   PRESCRIPTION MEDICATION Duplicate   guaiFENesin (MUCINEX) 600 MG 12 hr tablet Completed Course    Meds ordered this encounter  Medications   DISCONTD: rosuvastatin (CRESTOR) 5 MG tablet    Sig: Take 1 tablet (5 mg total) by mouth as directed. 4-5 times a week as tolerated    Dispense:  30 tablet    Refill:  1     Assessment & Recommendations:   82 y.o. Caucasian female with multiple prior spine surgeries, now with incidental finding of coronary artery disease noted on CT chest.  CAD: Lipid panel favorable. In spite of that, CAD noted on CT chest.  After much discussion about risks and benefits, we mutually agreed to start low dose Crestor 5 mg 4-5 times/week as tolerated. I will see her again in 6 weeks to assess tolerability.    Thank you for referring the patient to [place]. Please feel free to contact with any questions.   Nigel Mormon, MD Pager: [phone] Office: [phone]

## 2022-11-14 ENCOUNTER — Ambulatory Visit: Payer: Medicare Other | Admitting: Pulmonary Disease

## 2022-11-14 ENCOUNTER — Encounter: Payer: Self-pay | Admitting: Pulmonary Disease

## 2022-11-14 VITALS — BP 130/76 | HR 69 | Ht 63.0 in | Wt 126.6 lb

## 2022-11-14 DIAGNOSIS — R918 Other nonspecific abnormal finding of lung field: Secondary | ICD-10-CM

## 2022-11-14 DIAGNOSIS — R079 Chest pain, unspecified: Secondary | ICD-10-CM | POA: Diagnosis not present

## 2022-11-14 DIAGNOSIS — I2510 Atherosclerotic heart disease of native coronary artery without angina pectoris: Secondary | ICD-10-CM | POA: Diagnosis not present

## 2022-11-14 NOTE — Progress Notes (Signed)
$@Patienth$  ID: Lynn Kim, female    DOB: [birth year], 82 y.o.   MRN: [id number]  Chief Complaint  Patient presents with   Consult    Referring provider: Harlan Stains, MD  HPI:   82 y.o. 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  Most recent PCP note reviewed.  Patient feels fine.  No significant dyspnea.  Does have occasional cough.  Noted that she felt, short of breath unable to complete full lines when singing in choir.  She is an avid exerciser exercising multiple times a day without issue.  She has improved her ability to sing with voice strengthening exercises.  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  Repeat CT scan of the chest high-resolution 09/2022 reveals in my opinion and my interpretation on review improved nodule opacities on the right, unchanged on the left, similar bronchiectasis, evidence of mild air trapping on expiratory images.  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  We discussed at length the natural progression of NTM disease.  The likelihood of no significant changes in her symptoms over time.  Unlikely to cause significant symptoms with the exception of cough is related to bronchiectasis.  We discussed at length strategies to mitigate worsening cough in the future.  Stressed the importance of airway clearance.   Questionaires / Pulmonary Flowsheets:   ACT:      No data to display          MMRC:     No data to display          Epworth:      No data to display          Tests:   FENO:  No results found for: "NITRICOXIDE"  PFT:     No data to display          WALK:      No data to display          Imaging: Personally reviewed and as per EMR and discussion in this note CT Chest High Resolution  Result Date: 10/24/2022 CLINICAL  DATA:  82 year old female with history of shortness of breath, dyspnea on exertion and nausea. Former smoker. EXAM: CT CHEST WITHOUT CONTRAST TECHNIQUE: Multidetector CT imaging of the chest was performed following the standard protocol without intravenous contrast. High resolution imaging of the lungs, as well as inspiratory and expiratory imaging, was performed. RADIATION DOSE REDUCTION: This exam was performed according to the departmental dose-optimization program which includes automated exposure control, adjustment of the mA and/or kV according to patient size and/or use of iterative reconstruction technique. COMPARISON:  Chest CT 11/21/2003. FINDINGS: Cardiovascular: Heart size is normal. There is no significant pericardial fluid, thickening or pericardial calcification. There is aortic atherosclerosis, as well as atherosclerosis of the great vessels of the mediastinum and the coronary arteries, including calcified atherosclerotic plaque in the left anterior descending and left circumflex coronary arteries. Mediastinum/Nodes: No pathologically enlarged mediastinal or hilar lymph nodes. Please note that accurate exclusion of hilar adenopathy is limited on noncontrast CT scans. Esophagus is unremarkable in appearance. No axillary lymphadenopathy. Lungs/Pleura: There are some scattered areas of bronchial wall thickening, thickening of the peribronchovascular interstitium, regional architectural distortion and peribronchovascular micro and macronodularity, most evident in the base of the left lower lobe and inferior segment of the lingula, likely to reflect areas of chronic post  infectious or inflammatory scarring and areas of chronic mucoid impaction. High-resolution images demonstrate no generalized regions of ground-glass attenuation, septal thickening, subpleural reticulation, or honeycombing to suggest interstitial lung disease. No pleural effusions. Inspiratory and expiratory imaging demonstrates some mild  air trapping indicative of mild small airways disease. There is also some collapse of larger bronchi during expiration, indicative of bronchomalacia. Upper Abdomen: Aortic atherosclerosis. Surgical clips in the retroperitoneum, suggesting prior lymph node dissection. Musculoskeletal: Orthopedic rod hardware noted throughout the visualized thoracolumbar spine. Scoliosis. There are no aggressive appearing lytic or blastic lesions noted in the visualized portions of the skeleton. IMPRESSION: 1. No definitive imaging findings to suggest interstitial lung disease. 2. 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 Outpatient referral to Pulmonology for further clinical evaluation is recommended. 3. 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 4. Aortic atherosclerosis, in addition to two-vessel coronary artery disease. Assessment for potential risk factor modification, dietary therapy or pharmacologic therapy may be warranted, if clinically indicated. Aortic Atherosclerosis (ICD10-I70.0). Electronically Signed   By: Vinnie Langton M.D.   On: 10/24/2022 10:23    Lab Results: Personally reviewed, more recent labs demonstrate eosinophils of 600 CBC    Component Value Date/Time   WBC 6.3 05/12/2017 0800   RBC 4.74 05/12/2017 0800   HGB 14.6 05/12/2017 0800   HCT 43.5 05/12/2017 0800   PLT 242 05/12/2017 0800   MCV 91.8 05/12/2017 0800   MCH 30.8 05/12/2017 0800   MCHC 33.6 05/12/2017 0800   RDW 13.6 05/12/2017 0800   LYMPHSABS 1.5 05/12/2017 0800   MONOABS 0.5 05/12/2017 0800   EOSABS 0.0 05/12/2017 0800   BASOSABS 0.0 05/12/2017 0800    BMET    Component Value Date/Time   NA 139 05/12/2017 0800   K 3.7 05/12/2017 0800   CL 103 05/12/2017 0800   CO2 26 05/12/2017 0800   GLUCOSE 121 (H) 05/12/2017 0800   BUN 15 05/12/2017 0800   CREATININE 0.78 05/12/2017  0800   CALCIUM 9.2 05/12/2017 0800   GFRNONAA >60 05/12/2017 0800   GFRAA >60 05/12/2017 0800    BNP No results found for: "BNP"  ProBNP No results found for: "PROBNP"  Specialty Problems   None   Allergies  Allergen Reactions   Eggs Or Egg-Derived Products Nausea And Vomiting   Food     Almonds, cane sugar, grains    Influenza Vac Split Quad     Other Reaction(s): egg yolk allergy   Molds & Smuts     Other Reaction(s): via allergy test   Other Nausea And Vomiting   Peanut Oil     Other Reaction(s): thru a blood test   Sugar-Protein-Starch Other (See Comments)   Tobacco     Other Reaction(s): via allergy   Bacitracin-Polymyxin B Rash   E-Mycin [Erythromycin] Rash   Erythromycin Ethylsuccinate Rash   Lanolin Rash   Latex Rash    Other Reaction(s): soreness   Penicillin G Rash   Penicillins Rash   Petrolatum Rash   Sertraline Hcl Anxiety   Sulfa Antibiotics Rash    Immunization History  Administered Date(s) Administered   PFIZER(Purple Top)SARS-COV-2 Vaccination 10/19/2019, 11/09/2019    Past Medical History:  Diagnosis Date   Anxiety    PAST HISTORY   Arthritis    bilateral hands   Depression    genetic gene   Heart murmur    CHILDHOOD FUNCTIONAL HEART  MURMUR   Osteoporosis    Umbilical hernia    Vertigo     Tobacco History: Social History   Tobacco Use  Smoking Status Former  Smokeless Tobacco Former   Quit date: 04/06/1963   Counseling given: Not Answered   Continue to not smoke  Outpatient Encounter Medications as of 11/14/2022  Medication Sig   Alpha-Lipoic Acid 200 MG CAPS 1 capsule Orally Three times a day   azelastine (ASTELIN) 0.1 % nasal spray Place 2 sprays into both nostrils 2 (two) times daily.   Black Pepper-Turmeric (TURMERIC COMPLEX/BLACK PEPPER) 3-500 MG CAPS 1 capusle Orally twice a day   CALCIUM-MAGNESIUM-VITAMIN D PO Take 1 tablet by mouth 2 (two) times daily.   Calcium-Vitamin D-Vitamin K [id number] MG-UNT-MCG CHEW  Chew 1 tablet by mouth daily with lunch.   Cholecalciferol (VITAMIN D3) 2000 UNITS TABS Take 1 tablet by mouth 2 (two) times daily. 1000   cyanocobalamin 2000 MCG tablet Take 2,000 mcg by mouth daily.   DHEA 10 MG CAPS Take by mouth.   Digestive Enzymes (DIGESTIVE ENZYME PO) Take 1 tablet by mouth 2 (two) times daily before lunch and supper.   escitalopram (LEXAPRO) 5 MG tablet Take 5 mg by mouth daily.   L-Theanine 100 MG CAPS Take 100 mg by mouth 2 (two) times daily.   loratadine (CLARITIN) 10 MG tablet Take 10 mg by mouth daily.   melatonin 5 MG TABS Take 5 mg by mouth.   Menaquinone-7 (VITAMIN K2 PO) Take by mouth.   Nutritional Supplements (DHEA PO) Take 5 mg by mouth daily.   PRESCRIPTION MEDICATION Apply 1 application topically daily. Bi-est cream - compounded at [place] 1 application topically 3 (three) times a week. Estriol - Testosterone Cream - three times weekly (Sunday, Tuesday, Friday) Compounded at [facility]   Probiotic Product (ALIGN PO) Take 1 capsule by mouth 1 day or 1 dose.   progesterone (PROMETRIUM) 100 MG capsule Take 100 mg by mouth daily.   psyllium (METAMUCIL) 58.6 % powder Take 1 packet by mouth 3 (three) times daily.   rosuvastatin (CRESTOR) 5 MG tablet TAKE 1 TABLET(5 MG) BY MOUTH 4 TO 5 TIMES A WEEK AS DIRECTED AS TOLERATED   TESTOSTERONE PROPIONATE IJ Inject as directed daily at 6 (six) AM.   valACYclovir (VALTREX) 500 MG tablet Take 500 mg by mouth as needed.   [DISCONTINUED] Barberry-Oreg Grape-Goldenseal (BERBERINE COMPLEX PO) Take 500 mg by mouth 2 (two) times daily.   [DISCONTINUED] HYDROcodone-acetaminophen (NORCO/VICODIN) 5-325 MG tablet Take 1 tablet by mouth every 4 (four) hours as needed for severe pain.   [DISCONTINUED] Misc Natural Products (T-RELIEF CBD+13 SL) Place under the tongue.   No facility-administered encounter medications on file as of 11/14/2022.     Review of  Systems  Review of Systems  No chest pain with exertion.  No orthopnea or PND.  Comprehensive review of systems otherwise negative. Physical Exam  BP 130/76 (BP Location: Right Arm, Cuff Size: Normal)   Pulse 69   Ht 5' 3"$  (1.6 m)   Wt 126 lb 9.6 oz (57.4 kg)   SpO2 96%   BMI 22.43 kg/m   Wt Readings from Last 5 Encounters:  11/14/22 126 lb 9.6 oz (57.4 kg)  10/31/22 125 lb 9.6 oz (57 kg)  09/19/20 121 lb 4.1 oz (55 kg)  11/16/17 124 lb 9.6 oz (56.5 kg)  05/12/17 123 lb (55.8 kg)    BMI Readings from Last 5  Encounters:  11/14/22 22.43 kg/m  10/31/22 22.25 kg/m  09/19/20 21.48 kg/m  11/16/17 22.07 kg/m  05/12/17 21.79 kg/m     Physical Exam General: Sitting in chair, no acute distress Eyes: EOMI, no icterus Neck: Supple, no JVP Pulmonary: Clear, no work of breathing Cardiovascular: Warm, no edema Abdomen: Nondistended, bowel sounds present MSK: No synovitis, no joint effusion Neuro: Normal gait, no weakness Psych: Normal mood, full affect   Assessment & Plan:   Scattered nodular opacities in bilateral lower lobes: 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  Possible sequela of aspiration.  She  denies significant aspiration symptoms.  Mild bronchiectasis: Bilateral lower lobes.  Discussed unlikely to cause significant problems.  Even NTM.  Recommend airway clearance, keeping mucus moving.  Start flutter valve 2-3 times daily, increase as needed with worsening sputum production, cough.  Dyspnea on exertion: Seems mild.  Has had air trapping on CT expiratory images high-resolution.  Eosinophils elevated in the future.  Discussed considering inhaler therapy in the future.  Will hold off for now.  Consider PFTs in the future if symptoms worsen.   Return in about 3 months (around 02/12/2023).   Lanier Clam, MD 11/14/2022   This appointment required 60 minutes of patient care (this includes precharting, chart review, review of results, face-to-face care, etc.).

## 2022-11-14 NOTE — Patient Instructions (Signed)
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  In general, this did not cause many problems except for cough.  It will cause mucus production and cough.  For this reason I recommend using the flutter valve 2-3 times every day.  If you find your cough worsens episodically, you can use it more frequently during the day.  No other changes to medications.  In the future if you are more short of breath or have additional breathing issues, we can consider doing pulmonary function test as well as prescribed inhalers to help.  For now, the best thing you are doing is continuing to exercise regularly.  Return to clinic in 3 months or sooner as needed with Dr. Silas Flood

## 2022-11-17 ENCOUNTER — Telehealth: Payer: Self-pay | Admitting: Pulmonary Disease

## 2022-11-17 NOTE — Telephone Encounter (Signed)
Called and spoke with pt letting her know that the blue and green flutter valves are the same device and she verbalized understanding. Nothing further needed.

## 2022-11-26 DIAGNOSIS — H5213 Myopia, bilateral: Secondary | ICD-10-CM | POA: Diagnosis not present

## 2022-11-26 DIAGNOSIS — H2513 Age-related nuclear cataract, bilateral: Secondary | ICD-10-CM | POA: Diagnosis not present

## 2022-12-05 ENCOUNTER — Telehealth: Payer: Self-pay

## 2022-12-05 NOTE — Telephone Encounter (Signed)
Okay to hold statin for now. I will discuss more during her upcoming office visit.  Thanks MJP

## 2022-12-05 NOTE — Telephone Encounter (Signed)
Patient states she recently started rosuvastatin, and feels like she needs to stop taking this medication. Since starting it she has had severe constipation, headaches, tinnitis, and joint pain (knees and elbows). She is wondering if this okay? She said she would be willing to try it again later down the road after her cataract surgery in April.   Call back number: [phone] If she doesn't answer when we call back she asks that we leave a voicemail with the information.

## 2022-12-08 NOTE — Telephone Encounter (Signed)
Gave patient this information, she is discontinuing and will discuss it with Dr. Virgina Jock on 12/25/22 appt.

## 2022-12-12 ENCOUNTER — Ambulatory Visit: Payer: Medicare Other | Admitting: Cardiology

## 2022-12-25 ENCOUNTER — Encounter: Payer: Self-pay | Admitting: Cardiology

## 2022-12-25 ENCOUNTER — Ambulatory Visit: Payer: Medicare Other | Admitting: Cardiology

## 2022-12-25 VITALS — BP 140/66 | HR 71 | Resp 16 | Ht 63.0 in | Wt 125.0 lb

## 2022-12-25 DIAGNOSIS — I2510 Atherosclerotic heart disease of native coronary artery without angina pectoris: Secondary | ICD-10-CM

## 2022-12-25 NOTE — Progress Notes (Signed)
Patient referred by Harlan Stains, MD for coronary atherosclerosis  Subjective:   Lynn Kim, female    DOB: 21-Jun-1941, 82 y.o.   MRN: [id number]   Chief Complaint  Patient presents with   Coronary Artery Disease   Hyperlipidemia   Follow-up    6 week     HPI  82 y.o. Caucasian female with multiple prior spine surgeries, now with incidental finding of coronary artery disease noted on CT chest.  Patient stopped low-dose rosuvastatin due to multiple complaints, including severe constipation, headaches, tinnitus, joint pain. She wants to hold off any statin therapy.   Initial consultation 10/2022: Patient does high intensity exercise regularly without any chest pain, dyspnea. Lipid panel favorable. In spite of that, CAD noted on CT chest.     Current Outpatient Medications:    Alpha-Lipoic Acid 200 MG CAPS, 1 capsule Orally Three times a day, Disp: , Rfl:    azelastine (ASTELIN) 0.1 % nasal spray, Place 2 sprays into both nostrils 2 (two) times daily., Disp: 30 mL, Rfl: 5   Black Pepper-Turmeric (TURMERIC COMPLEX/BLACK PEPPER) 3-500 MG CAPS, 1 capusle Orally twice a day, Disp: , Rfl:    CALCIUM-MAGNESIUM-VITAMIN D PO, Take 1 tablet by mouth 2 (two) times daily., Disp: , Rfl:    Calcium-Vitamin D-Vitamin K 500-500-40 MG-UNT-MCG CHEW, Chew 1 tablet by mouth daily with lunch., Disp: , Rfl:    Cholecalciferol (VITAMIN D3) 2000 UNITS TABS, Take 1 tablet by mouth 2 (two) times daily. 1000, Disp: , Rfl:    cyanocobalamin 2000 MCG tablet, Take 2,000 mcg by mouth daily., Disp: , Rfl:    DHEA 10 MG CAPS, Take by mouth., Disp: , Rfl:    Digestive Enzymes (DIGESTIVE ENZYME PO), Take 1 tablet by mouth 2 (two) times daily before lunch and supper., Disp: , Rfl:    escitalopram (LEXAPRO) 5 MG tablet, Take 5 mg by mouth daily., Disp: , Rfl:    L-Theanine 100 MG CAPS, Take 100 mg by mouth 2 (two) times daily., Disp: , Rfl:    loratadine (CLARITIN) 10 MG tablet, Take 10 mg by mouth  daily., Disp: , Rfl:    melatonin 5 MG TABS, Take 5 mg by mouth., Disp: , Rfl:    Menaquinone-7 (VITAMIN K2 PO), Take by mouth., Disp: , Rfl:    Nutritional Supplements (DHEA PO), Take 5 mg by mouth daily., Disp: , Rfl:    PRESCRIPTION MEDICATION, Apply 1 application topically daily. Bi-est cream - compounded at [facility], Disp: , Rfl:    PRESCRIPTION MEDICATION, Apply 1 application topically 3 (three) times a week. Estriol - Testosterone Cream - three times weekly (Sunday, Tuesday, Friday) Compounded at [facility], Disp: , Rfl:    Probiotic Product (ALIGN PO), Take 1 capsule by mouth 1 day or 1 dose., Disp: , Rfl:    progesterone (PROMETRIUM) 100 MG capsule, Take 100 mg by mouth daily., Disp: , Rfl:    psyllium (METAMUCIL) 58.6 % powder, Take 1 packet by mouth 3 (three) times daily., Disp: , Rfl:    rosuvastatin (CRESTOR) 5 MG tablet, TAKE 1 TABLET(5 MG) BY MOUTH 4 TO 5 TIMES A WEEK AS DIRECTED AS TOLERATED, Disp: 90 tablet, Rfl: 1   TESTOSTERONE PROPIONATE IJ, Inject as directed daily at 6 (six) AM., Disp: , Rfl:    valACYclovir (VALTREX) 500 MG tablet, Take 500 mg by mouth as needed., Disp: , Rfl:    Cardiovascular and other pertinent studies:  Reviewed external labs and tests,  independently interpreted  EKG 10/31/2022: Sinus rhythm 63 bpm Normal EKG   CT chest 10/23/2022: 1. No definitive imaging findings to suggest interstitial lung disease. 2. 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 Outpatient referral to Pulmonology for further clinical evaluation is recommended. 3. 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 4. Aortic atherosclerosis, in addition to two-vessel coronary artery disease. Assessment for potential risk factor modification, dietary therapy or pharmacologic therapy may be warranted, if  clinically indicated.   Aortic Atherosclerosis   Recent labs: 03/18/2022: Glucose 92, BUN/Cr NA/0.66. EGFR 84.  HbA1C 5.9% Chol 156, TG 52, HDL 62, LDL 84   Review of Systems  Cardiovascular:  Negative for chest pain, dyspnea on exertion, leg swelling, palpitations and syncope.         Vitals:   12/25/22 0923  BP: (!) 140/66  Pulse: 71  Resp: 16  SpO2: 96%     Body mass index is 22.14 kg/m. Filed Weights   12/25/22 0923  Weight: 125 lb (56.7 kg)     Objective:   Physical Exam Vitals and nursing note reviewed.  Constitutional:      General: She is not in acute distress. Neck:     Vascular: No JVD.  Cardiovascular:     Rate and Rhythm: Normal rate and regular rhythm.     Heart sounds: Normal heart sounds. No murmur heard. Pulmonary:     Effort: Pulmonary effort is normal.     Breath sounds: Normal breath sounds. No wheezing or rales.  Musculoskeletal:     Right lower leg: No edema.     Left lower leg: No edema.          Visit diagnoses:   ICD-10-CM   1. Coronary artery disease involving native coronary artery of native heart without angina pectoris  I25.10         Assessment & Recommendations:   82 y.o. Caucasian female with multiple prior spine surgeries, now with incidental finding of coronary artery disease noted on CT chest.  CAD: Lipid panel favorable. In spite of that, CAD noted on CT chest.  After much discussion about risks and benefits, we mutually agreed to start low dose Crestor 5 mg 4-5 times/week as tolerated. However, she soon came off it due to multiple side effects.  She would like to hold off any lipid-lowering therapy at this time. Continue heart healthy diet and lifestyle.  She continue follow-up with PCP.  I will see her on as-needed basis.  Thank you for referring the patient to [place]. Please feel free to contact with any questions.   Nigel Mormon, MD Pager: [phone] Office: [phone]

## 2023-01-15 DIAGNOSIS — H269 Unspecified cataract: Secondary | ICD-10-CM | POA: Diagnosis not present

## 2023-01-15 DIAGNOSIS — H2512 Age-related nuclear cataract, left eye: Secondary | ICD-10-CM | POA: Diagnosis not present

## 2023-02-03 DIAGNOSIS — H903 Sensorineural hearing loss, bilateral: Secondary | ICD-10-CM | POA: Diagnosis not present

## 2023-02-09 DIAGNOSIS — H25811 Combined forms of age-related cataract, right eye: Secondary | ICD-10-CM | POA: Diagnosis not present

## 2023-02-09 DIAGNOSIS — H2511 Age-related nuclear cataract, right eye: Secondary | ICD-10-CM | POA: Diagnosis not present

## 2023-02-10 ENCOUNTER — Other Ambulatory Visit: Payer: Self-pay | Admitting: Family Medicine

## 2023-02-10 DIAGNOSIS — Z1231 Encounter for screening mammogram for malignant neoplasm of breast: Secondary | ICD-10-CM

## 2023-02-16 ENCOUNTER — Ambulatory Visit: Payer: Medicare Other | Admitting: Pulmonary Disease

## 2023-02-16 ENCOUNTER — Encounter: Payer: Self-pay | Admitting: Pulmonary Disease

## 2023-02-16 VITALS — BP 124/68 | HR 68 | Temp 98.3°F | Ht 63.0 in | Wt 124.0 lb

## 2023-02-16 DIAGNOSIS — J479 Bronchiectasis, uncomplicated: Secondary | ICD-10-CM

## 2023-02-16 NOTE — Progress Notes (Unsigned)
@Patient  ID: Lynn Kim, female    DOB: Jan 28, 1941, 82 y.o.   MRN: [id number]  Chief Complaint  Patient presents with   Follow-up    No c/o     Referring provider: Laurann Montana, MD  HPI:   82 y.o. 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  Most recent Cardiology note reviewed.  Patient feels fine.  No significant dyspnea.  Does have occasional cough.  Noted that she felt, short of breath unable to complete full lines when singing in choir.  She is an avid exerciser exercising multiple times a day without issue.  She has improved her ability to sing with voice strengthening exercises.  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  Repeat CT scan of the chest high-resolution 09/2022 reveals in my opinion and my interpretation on review improved nodule opacities on the right, unchanged on the left, similar bronchiectasis, evidence of mild air trapping on expiratory images.  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  We discussed at length the natural progression of NTM disease.  The likelihood of no significant changes in her symptoms over time.  Unlikely to cause significant symptoms with the exception of cough is related to bronchiectasis.  We discussed at length strategies to mitigate worsening cough in the future.  Stressed the importance of airway clearance.   Questionaires / Pulmonary Flowsheets:   ACT:      No data to display           MMRC:     No data to display           Epworth:      No data to display           Tests:   FENO:  No results found for: "NITRICOXIDE"  PFT:     No data to display           WALK:      No data to display           Imaging: Personally reviewed and as per EMR and discussion in this note No results found.  Lab  Results: Personally reviewed, more recent labs demonstrate eosinophils of 600 CBC    Component Value Date/Time   WBC 6.3 05/12/2017 0800   RBC 4.74 05/12/2017 0800   HGB 14.6 05/12/2017 0800   HCT 43.5 05/12/2017 0800   PLT 242 05/12/2017 0800   MCV 91.8 05/12/2017 0800   MCH 30.8 05/12/2017 0800   MCHC 33.6 05/12/2017 0800   RDW 13.6 05/12/2017 0800   LYMPHSABS 1.5 05/12/2017 0800   MONOABS 0.5 05/12/2017 0800   EOSABS 0.0 05/12/2017 0800   BASOSABS 0.0 05/12/2017 0800    BMET    Component Value Date/Time   NA 139 05/12/2017 0800   K 3.7 05/12/2017 0800   CL 103 05/12/2017 0800   CO2 26 05/12/2017 0800   GLUCOSE 121 (H) 05/12/2017 0800   BUN 15 05/12/2017 0800   CREATININE 0.78 05/12/2017 0800   CALCIUM 9.2 05/12/2017 0800   GFRNONAA >60 05/12/2017 0800   GFRAA >60 05/12/2017 0800    BNP No results found for: "BNP"  ProBNP No results found for: "PROBNP"  Specialty Problems   None   Allergies  Allergen Reactions   Egg-Derived Products Nausea And Vomiting   Food     Almonds, cane sugar,  grains    Influenza Vac Split Quad     Other Reaction(s): egg yolk allergy   Molds & Smuts     Other Reaction(s): via allergy test   Other Nausea And Vomiting   Peanut Oil     Other Reaction(s): thru a blood test   Sugar-Protein-Starch Other (See Comments)   Tobacco     Other Reaction(s): via allergy   Bacitracin-Polymyxin B Rash   E-Mycin [Erythromycin] Rash   Erythromycin Ethylsuccinate Rash   Lanolin Rash   Latex Rash    Other Reaction(s): soreness   Penicillin G Rash   Penicillins Rash   Petrolatum Rash   Sertraline Hcl Anxiety   Sulfa Antibiotics Rash    Immunization History  Administered Date(s) Administered   Influenza Split 07/26/2013, 06/27/2022   Influenza, Quadrivalent, Recombinant, Inj, Pf 08/22/2019, 07/04/2020, 07/11/2021   PFIZER(Purple Top)SARS-COV-2 Vaccination 10/19/2019, 11/09/2019, 07/30/2020   Pneumococcal Conjugate-13 09/08/2014    Pneumococcal Polysaccharide-23 01/05/2009   Td 03/05/2021   Td (Adult) 01/15/1999   Tdap 01/22/2011   Zoster Recombinat (Shingrix) 08/14/2017   Zoster, Live 12/06/2006, 04/27/2017, 08/14/2017    Past Medical History:  Diagnosis Date   Anxiety    PAST HISTORY   Arthritis    bilateral hands   Depression    genetic gene   Heart murmur    CHILDHOOD FUNCTIONAL HEART MURMUR   Osteoporosis    Umbilical hernia    Vertigo     Tobacco History: Social History   Tobacco Use  Smoking Status Former  Smokeless Tobacco Former   Quit date: 04/06/1963   Counseling given: Not Answered   Continue to not smoke  Outpatient Encounter Medications as of 02/16/2023  Medication Sig   Alpha-Lipoic Acid 200 MG CAPS 1 capsule Orally Three times a day   azelastine (ASTELIN) 0.1 % nasal spray Place 2 sprays into both nostrils 2 (two) times daily.   Black Pepper-Turmeric (TURMERIC COMPLEX/BLACK PEPPER) 3-500 MG CAPS 1 capusle Orally twice a day   CALCIUM PO Take by mouth.   CALCIUM-MAGNESIUM-VITAMIN D PO Take 1 tablet by mouth 2 (two) times daily.   Calcium-Vitamin D-Vitamin K 500-500-40 MG-UNT-MCG CHEW Chew 1 tablet by mouth daily with lunch.   Cholecalciferol (VITAMIN D3) 2000 UNITS TABS Take 1 tablet by mouth 2 (two) times daily. 1000   Digestive Enzymes (DIGESTIVE ENZYME PO) Take 1 tablet by mouth 2 (two) times daily before lunch and supper.   escitalopram (LEXAPRO) 5 MG tablet Take 5 mg by mouth daily.   L-Theanine 100 MG CAPS Take 100 mg by mouth 2 (two) times daily.   loratadine (CLARITIN) 10 MG tablet Take 10 mg by mouth daily.   melatonin 5 MG TABS Take 5 mg by mouth.   Menaquinone-7 (VITAMIN K2 PO) Take by mouth.   Nutritional Supplements (DHEA PO) Take 5 mg by mouth daily.   PRESCRIPTION MEDICATION Apply 1 application topically daily. Bi-est cream - compounded at [facility]   PRESCRIPTION MEDICATION Apply 1 application topically 3 (three) times a week. Estriol - Testosterone  Cream - three times weekly (Sunday, Tuesday, Friday) Compounded at [facility]   Probiotic Product (ALIGN PO) Take 1 capsule by mouth 1 day or 1 dose.   progesterone (PROMETRIUM) 100 MG capsule Take 100 mg by mouth daily.   psyllium (METAMUCIL) 58.6 % powder Take 1 packet by mouth 3 (three) times daily.   valACYclovir (VALTREX) 500 MG tablet Take 500 mg by mouth as needed.   No facility-administered encounter  medications on file as of 02/16/2023.     Review of Systems  Review of Systems  No chest pain with exertion.  No orthopnea or PND.  Comprehensive review of systems otherwise negative. Physical Exam  BP 124/68 (BP Location: Right Arm, Patient Position: Sitting, Cuff Size: Normal)   Pulse 68   Temp 98.3 F (36.8 C) (Oral)   Ht 5\' 3"  (1.6 m)   Wt 124 lb (56.2 kg)   SpO2 97%   BMI 21.97 kg/m   Wt Readings from Last 5 Encounters:  02/16/23 124 lb (56.2 kg)  12/25/22 125 lb (56.7 kg)  11/14/22 126 lb 9.6 oz (57.4 kg)  10/31/22 125 lb 9.6 oz (57 kg)  09/19/20 121 lb 4.1 oz (55 kg)    BMI Readings from Last 5 Encounters:  02/16/23 21.97 kg/m  12/25/22 22.14 kg/m  11/14/22 22.43 kg/m  10/31/22 22.25 kg/m  09/19/20 21.48 kg/m     Physical Exam General: Sitting in chair, no acute distress Eyes: EOMI, no icterus Neck: Supple, no JVP Pulmonary: Clear, no work of breathing Cardiovascular: Warm, no edema Abdomen: Nondistended, bowel sounds present MSK: No synovitis, no joint effusion Neuro: Normal gait, no weakness Psych: Normal mood, full affect   Assessment & Plan:   Scattered nodular opacities in bilateral lower lobes: 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  Possible sequela of aspiration.  She  denies significant aspiration symptoms.  Mild bronchiectasis: Bilateral lower lobes.  Discussed unlikely to cause significant problems.  Even NTM.  Recommend airway clearance, keeping mucus moving.  Start flutter valve 2-3 times daily, increase as needed with  worsening sputum production, cough.  Dyspnea on exertion: Seems mild.  Has had air trapping on CT expiratory images high-resolution.  Eosinophils elevated in the future.  Discussed considering inhaler therapy in the future.  Will hold off for now.  Consider PFTs in the future if symptoms worsen.   Return in about 6 months (around 08/19/2023).   Karren Burly, MD 02/16/2023   This appointment required 60 minutes of patient care (this includes precharting, chart review, review of results, face-to-face care, etc.).

## 2023-02-16 NOTE — Patient Instructions (Signed)
Nice to see you again  Continue the flutter valve as you are  No changes or additions to medications  Ok to use mucinex as needed  Return to clinic in 6 months or sooner as needed

## 2023-02-26 ENCOUNTER — Ambulatory Visit
Admission: RE | Admit: 2023-02-26 | Discharge: 2023-02-26 | Disposition: A | Discharge status: Home / Self Care | Payer: Medicare Other | Source: Ambulatory Visit | Attending: Family Medicine | Admitting: Family Medicine

## 2023-02-26 DIAGNOSIS — Z1231 Encounter for screening mammogram for malignant neoplasm of breast: Secondary | ICD-10-CM

## 2023-03-13 DIAGNOSIS — Z961 Presence of intraocular lens: Secondary | ICD-10-CM | POA: Diagnosis not present

## 2023-03-30 DIAGNOSIS — I2510 Atherosclerotic heart disease of native coronary artery without angina pectoris: Secondary | ICD-10-CM | POA: Diagnosis not present

## 2023-03-30 DIAGNOSIS — E559 Vitamin D deficiency, unspecified: Secondary | ICD-10-CM | POA: Diagnosis not present

## 2023-03-30 DIAGNOSIS — M419 Scoliosis, unspecified: Secondary | ICD-10-CM | POA: Diagnosis not present

## 2023-03-30 DIAGNOSIS — J479 Bronchiectasis, uncomplicated: Secondary | ICD-10-CM | POA: Diagnosis not present

## 2023-03-30 DIAGNOSIS — R7303 Prediabetes: Secondary | ICD-10-CM | POA: Diagnosis not present

## 2023-03-30 DIAGNOSIS — Z23 Encounter for immunization: Secondary | ICD-10-CM | POA: Diagnosis not present

## 2023-03-30 DIAGNOSIS — I700 Atherosclerosis of aorta: Secondary | ICD-10-CM | POA: Diagnosis not present

## 2023-03-30 DIAGNOSIS — Z0000 Encounter for general adult medical examination without abnormal findings: Secondary | ICD-10-CM | POA: Diagnosis not present

## 2023-03-31 ENCOUNTER — Other Ambulatory Visit: Payer: Self-pay | Admitting: Family Medicine

## 2023-03-31 DIAGNOSIS — M810 Age-related osteoporosis without current pathological fracture: Secondary | ICD-10-CM

## 2023-04-08 DIAGNOSIS — R7303 Prediabetes: Secondary | ICD-10-CM | POA: Diagnosis not present

## 2023-04-08 DIAGNOSIS — Z780 Asymptomatic menopausal state: Secondary | ICD-10-CM | POA: Diagnosis not present

## 2023-04-08 DIAGNOSIS — E039 Hypothyroidism, unspecified: Secondary | ICD-10-CM | POA: Diagnosis not present

## 2023-04-24 DIAGNOSIS — H26492 Other secondary cataract, left eye: Secondary | ICD-10-CM | POA: Diagnosis not present

## 2023-04-25 DIAGNOSIS — R319 Hematuria, unspecified: Secondary | ICD-10-CM | POA: Diagnosis not present

## 2023-05-06 DIAGNOSIS — R310 Gross hematuria: Secondary | ICD-10-CM | POA: Diagnosis not present

## 2023-05-06 DIAGNOSIS — R3915 Urgency of urination: Secondary | ICD-10-CM | POA: Diagnosis not present

## 2023-05-12 DIAGNOSIS — U071 COVID-19: Secondary | ICD-10-CM | POA: Diagnosis not present

## 2023-05-22 DIAGNOSIS — N201 Calculus of ureter: Secondary | ICD-10-CM | POA: Diagnosis not present

## 2023-05-22 DIAGNOSIS — N132 Hydronephrosis with renal and ureteral calculous obstruction: Secondary | ICD-10-CM | POA: Diagnosis not present

## 2023-05-22 DIAGNOSIS — N200 Calculus of kidney: Secondary | ICD-10-CM | POA: Diagnosis not present

## 2023-05-22 DIAGNOSIS — R310 Gross hematuria: Secondary | ICD-10-CM | POA: Diagnosis not present

## 2023-05-22 DIAGNOSIS — R109 Unspecified abdominal pain: Secondary | ICD-10-CM | POA: Diagnosis not present

## 2023-05-25 ENCOUNTER — Other Ambulatory Visit: Payer: Self-pay | Admitting: Urology

## 2023-05-26 ENCOUNTER — Encounter (HOSPITAL_BASED_OUTPATIENT_CLINIC_OR_DEPARTMENT_OTHER): Payer: Self-pay | Admitting: Urology

## 2023-05-26 NOTE — Progress Notes (Addendum)
Pre-op phone call complete. Procedure date and arrival time confirmed. Patient allergies, medical history, and medications verified. Patient to be NPO at midnight. Patient tested positive for COVID on 05/12/2023 and no longer having symptoms.  Patient advised to stop vitamins. Driver secured.

## 2023-05-27 NOTE — H&P (Signed)
New patient-    1) gross hematuria-patient voided and noted red urine July 2024. UA with blood on the dip. No pain. No dysuria. She does Kegels for urgency. She did have a CT scan September 2023 which showed a small left lower pole stone. Urine has cleared. Remote smoking in college but second smoke in the office x 17 yrs. No chemo or XRT. No chemical exposure.   No hysterectomy or pelvic surgery. No Gu surgery.   UA today with 3-10 RBCs.   She is not on oral anticoagulants. She was an Airline pilot. She works out 6 days a week. She is also a CPT.   05/22/2023: Seen today acutely. Originally scheduled for CT hematuria protocol on 8/27. Over the past several days she has had development of left lower back pain radiating into the flank and lower abdomen. She has been trying to stay active, using a heating pad which has been helpful. Further discussion with the patient reveal she has not seen any additional gross hematuria but urine has been intermittently darker yellow in color. Symptoms are not associated with dysuria. She is beginning to have some increased urinary urgency especially with increased pain in the left lower quadrant of the abdomen. Denies seeing any stone material passed. Also now beginning to develop some nausea over the past 12 hours. Denies vomiting, fevers or chills. UA today without microscopic hematuria.     ALLERGIES: No Allergies    MEDICATIONS: Alpha Lipoic Acid 1 PO Daily  Calcium 1 PO Daily  Dhea 1 PO Daily  L-Theanine 1 PO Daily  Progesterone 100 mg capsule 1 capsule PO Daily  Psyllium Fiber 0.4 gram capsule 1 capsule PO Daily  Refresh Tears 1 PO Daily  Testosterone 1 PO Daily  Turmeric 1 PO Daily  Valacyclovir 1 PO Daily  Vitamin B-12 1 PO Daily  Vitamin D2 50 mcg (2,000 unit) capsule 1 capsule PO Daily  Vitamin K 1 PO Daily     GU PSH: No GU PSH    NON-GU PSH: No Non-GU PSH    GU PMH: Gross hematuria, check CT scan and cystoscopy. Discussed the nature r/b/a  to these procedures. - 05/06/2023 Urinary Urgency - 05/06/2023    NON-GU PMH: No Non-GU PMH    FAMILY HISTORY: No Family History    SOCIAL HISTORY: No Social History    REVIEW OF SYSTEMS:    GU Review Female:   Patient reports frequent urination and hard to postpone urination. Patient denies burning /pain with urination, get up at night to urinate, leakage of urine, stream starts and stops, trouble starting your stream, have to strain to urinate, and being pregnant.  Gastrointestinal (Upper):   Patient reports nausea. Patient denies vomiting and indigestion/ heartburn.  Gastrointestinal (Lower):   Patient denies constipation and diarrhea.  Constitutional:   Patient reports fatigue. Patient denies fever, night sweats, and weight loss.  Skin:   Patient denies skin rash/ lesion and itching.  Eyes:   Patient denies blurred vision and double vision.  Ears/ Nose/ Throat:   Patient denies sore throat and sinus problems.  Hematologic/Lymphatic:   Patient denies swollen glands and easy bruising.  Cardiovascular:   Patient denies leg swelling and chest pains.  Respiratory:   Patient denies cough and shortness of breath.  Endocrine:   Patient denies excessive thirst.  Musculoskeletal:   flank pain.  Patient reports back pain. Patient denies joint pain.  Neurological:   Patient denies headaches and dizziness.  Psychologic:   Patient denies  depression and anxiety.   VITAL SIGNS:      05/22/2023 10:35 AM  BP 136/78 mmHg  Pulse 73 /min  Temperature 96.9 F / 36.0 C   MULTI-SYSTEM PHYSICAL EXAMINATION:    Constitutional: Well-nourished. No physical deformities. Normally developed. Good grooming.  Neck: Neck symmetrical, not swollen. Normal tracheal position.  Respiratory: No labored breathing, no use of accessory muscles.   Cardiovascular: Normal temperature, normal extremity pulses, no swelling, no varicosities.  Skin: No paleness, no jaundice, no cyanosis. No lesion, no ulcer, no rash.   Neurologic / Psychiatric: Oriented to time, oriented to place, oriented to person. No depression, no anxiety, no agitation.  Gastrointestinal: No mass, no tenderness, no rigidity, non obese abdomen. No CVA or flank tenderness.  Musculoskeletal: Normal gait and station of head and neck.     Complexity of Data:  Source Of History:  Patient, Medical Record Summary  Records Review:   Previous Doctor Records, Previous Hospital Records, Previous Patient Records  Urine Test Review:   Urinalysis  X-Ray Review: C.T. Stone Protocol: Reviewed Films. Discussed With Patient.  C.T. Abdomen/Pelvis: Reviewed Films. Reviewed Report.     05/22/23  Urinalysis  Urine Appearance Clear   Urine Color Yellow   Urine Glucose Neg mg/dL  Urine Bilirubin Neg mg/dL  Urine Ketones Neg mg/dL  Urine Specific Gravity <=1.005   Urine Blood 1+ ery/uL  Urine pH <=5.0   Urine Protein Neg mg/dL  Urine Urobilinogen 0.2 mg/dL  Urine Nitrites Neg   Urine Leukocyte Esterase Trace leu/uL  Urine WBC/hpf 0 - 5/hpf   Urine RBC/hpf 0 - 2/hpf   Urine Epithelial Cells 0 - 5/hpf   Urine Bacteria Rare (0-9/hpf)   Urine Mucous Not Present   Urine Yeast NS (Not Seen)   Urine Trichomonas Not Present   Urine Cystals NS (Not Seen)   Urine Casts NS (Not Seen)   Urine Sperm Not Present    PROCEDURES:         C.T. Urogram - [id number]      Patient confirmed No Neulasta OnPro Device.         Urinalysis w/Scope Dipstick Dipstick Cont'd Micro  Color: Yellow Bilirubin: Neg mg/dL WBC/hpf: 0 - 5/hpf  Appearance: Clear Ketones: Neg mg/dL RBC/hpf: 0 - 2/hpf  Specific Gravity: <=1.005 Blood: 1+ ery/uL Bacteria: Rare (0-9/hpf)  pH: <=5.0 Protein: Neg mg/dL Cystals: NS (Not Seen)  Glucose: Neg mg/dL Urobilinogen: 0.2 mg/dL Casts: NS (Not Seen)    Nitrites: Neg Trichomonas: Not Present    Leukocyte Esterase: Trace leu/uL Mucous: Not Present      Epithelial Cells: 0 - 5/hpf      Yeast: NS (Not Seen)      Sperm: Not Present     ASSESSMENT:      ICD-10 Details  1 GU:   Gross hematuria - R31.0 Acute, Complicated Injury  2   Flank Pain - R10.84 Undiagnosed New Problem  3   Ureteral calculus - N20.1 Left, Undiagnosed New Problem   PLAN:            Medications New Meds: Hydrocodone-Acetaminophen 5 mg-325 mg tablet 1 tablet PO Q 6 H PRN Use for moderate-severe pain  #15  0 Refill(s)  Ondansetron Hcl 4 mg tablet 1 tablet PO Q 6 H PRN Use for nausea/vomiting  #20  0 Refill(s)  Pharmacy Name:  [id number]  Address:  [address]   [place], [place] [id number]  Phone:  [phone]  Fax:  [phone]  Orders Labs Urine Culture  X-Rays: C.T. Stone Protocol Without I.V. Contrast  X-Ray Notes: History:  Hematuria: Yes/No  Patient to see MD after exam: Yes/No  Previous exam: CT / IVP/ US/ KUB/ None  When:  Where:  Diabetic: Yes/ No  BUN/ Creatinine:  Date of last BUN Creatinine:  Weight in pounds:  Allergy- IV Contrast: Yes/ No  Conflicting diabetic meds: Yes/ No  Diabetic Meds:  Prior Authorization #: [id number]            Schedule Return Visit/Planned Activity: Next Available Appointment - Follow up MD, Schedule Surgery             Note: Cancel CT hematuria protocol study on 08/27          Document Letter(s):  Created for Patient: Clinical Summary         Notes:   I spoke with CT staff, unable to accommodate hematuria protocol study acutely today. Given the presence of worsening symptoms, I went ahead and proceeded with a non contrasted stone protocol study.   Very easily seen on CT scout imaging, she has a left 6x71mm upj/proximal ureteral calculus. This explains her previous and now new/symptoms. Final radiological interpretation pending but at this point I think we could probably defer the contrasted CT study scheduled for next week.   Based on stone size, I believe MET would be ineffective. Discussed likely treatment scenarios specifically ESWL and  Ureteroscopy with plan for OV and removal of stent after confirmation from review of operative note and/or KUB stone is no longer found to be a signficant risk to cause obstruction.  We did discuss the complications of shockwave lithotripsy including need for additional procedures, hematoma, obstructing fragments, and ongoing pain. I have reviewed the risks of ureteroscopy with the patient including bleeding, infection, ureteral injury, need for a stent or secondary procedures, thrombotic events and anesthetic complications.   Stone visibility makes her an excellent candidate for ESWL. I will send message to Dr. Mena Goes regarding today's visit and if in agreement, the patient will be contacted and scheduled appropriately. I did send her in some antinausea medication as well as pain medication. She can continue using Tylenol as well. She will continue staying well-hydrated and nourished, remaining active as possible. In the event she does pass a stone prior to being scheduled, she understands to contact the office. We discussed ED follow-up instructions for over the weekend as well as return to clinic instructions for poorly controlled symptomology including painful inability to void, uncontrollable pain/discomfort, uncontrollable nausea/vomiting and uncontrollable fever/chills.          Next Appointment:      Next Appointment: 05/26/2023 10:30 AM    Appointment Type: CT Scan    Location: [facility] [phone]    Provider: CT CT    Reason for Visit: CT Hematuria with and without/Gross Hematuria/[place]

## 2023-05-29 ENCOUNTER — Ambulatory Visit (HOSPITAL_COMMUNITY): Payer: Medicare Other

## 2023-05-29 ENCOUNTER — Ambulatory Visit (HOSPITAL_BASED_OUTPATIENT_CLINIC_OR_DEPARTMENT_OTHER)
Admission: RE | Admit: 2023-05-29 | Discharge: 2023-05-29 | Disposition: A | Discharge status: Home / Self Care | Payer: Medicare Other | Source: Home / Self Care | Attending: Urology | Admitting: Urology

## 2023-05-29 ENCOUNTER — Encounter (HOSPITAL_BASED_OUTPATIENT_CLINIC_OR_DEPARTMENT_OTHER): Payer: Self-pay | Admitting: Urology

## 2023-05-29 ENCOUNTER — Encounter (HOSPITAL_BASED_OUTPATIENT_CLINIC_OR_DEPARTMENT_OTHER)
Admission: RE | Disposition: A | Discharge status: Home / Self Care | Payer: Self-pay | Source: Home / Self Care | Attending: Urology

## 2023-05-29 ENCOUNTER — Other Ambulatory Visit: Payer: Self-pay

## 2023-05-29 DIAGNOSIS — Z87891 Personal history of nicotine dependence: Secondary | ICD-10-CM | POA: Insufficient documentation

## 2023-05-29 DIAGNOSIS — R310 Gross hematuria: Secondary | ICD-10-CM | POA: Diagnosis not present

## 2023-05-29 DIAGNOSIS — N2889 Other specified disorders of kidney and ureter: Secondary | ICD-10-CM | POA: Diagnosis not present

## 2023-05-29 DIAGNOSIS — N201 Calculus of ureter: Secondary | ICD-10-CM | POA: Insufficient documentation

## 2023-05-29 HISTORY — PX: EXTRACORPOREAL SHOCK WAVE LITHOTRIPSY: SHX1557

## 2023-05-29 SURGERY — LITHOTRIPSY, ESWL
Anesthesia: LOCAL | Laterality: Left

## 2023-05-29 MED ORDER — SODIUM CHLORIDE 0.9 % IV SOLN: INTRAVENOUS | Status: DC

## 2023-05-29 MED ORDER — DIPHENHYDRAMINE HCL 25 MG PO CAPS
ORAL_CAPSULE | ORAL | Status: AC
  Filled 2023-05-29: qty 1

## 2023-05-29 MED ORDER — CIPROFLOXACIN HCL 500 MG PO TABS
ORAL_TABLET | ORAL | Status: AC
  Filled 2023-05-29: qty 1

## 2023-05-29 MED ORDER — DIAZEPAM 5 MG PO TABS: 10.0000 mg | ORAL_TABLET | ORAL | Status: DC

## 2023-05-29 MED ORDER — DIPHENHYDRAMINE HCL 25 MG PO CAPS
25.0000 mg | ORAL_CAPSULE | ORAL | Status: AC
  Administered 2023-05-29: 25 mg via ORAL

## 2023-05-29 MED ORDER — CIPROFLOXACIN HCL 500 MG PO TABS
500.0000 mg | ORAL_TABLET | ORAL | Status: AC
  Administered 2023-05-29: 500 mg via ORAL

## 2023-05-29 MED ORDER — DIAZEPAM 5 MG PO TABS
ORAL_TABLET | ORAL | Status: AC
  Filled 2023-05-29: qty 1

## 2023-05-29 MED ORDER — SODIUM CHLORIDE 0.9% FLUSH: 3.0000 mL | Freq: Two times a day (BID) | INTRAVENOUS | Status: DC

## 2023-05-29 MED ORDER — DIAZEPAM 5 MG PO TABS
5.0000 mg | ORAL_TABLET | Freq: Once | ORAL | Status: AC
  Administered 2023-05-29: 5 mg via ORAL

## 2023-05-29 NOTE — Op Note (Signed)
See [facility] op note.

## 2023-05-29 NOTE — Interval H&P Note (Signed)
History and Physical Interval Note:  Stone location unchanged  05/29/2023 12:24 PM  Lynn Kim  has presented today for surgery, with the diagnosis of LEFT URETERAL STONE.  The various methods of treatment have been discussed with the patient and family. After consideration of risks, benefits and other options for treatment, the patient has consented to  Procedure(s): EXTRACORPOREAL SHOCK WAVE LITHOTRIPSY (ESWL) (Left) as a surgical intervention.  The patient's history has been reviewed, patient examined, no change in status, stable for surgery.  I have reviewed the patient's chart and labs.  Questions were answered to the patient's satisfaction.     Bjorn Pippin

## 2023-06-02 ENCOUNTER — Encounter (HOSPITAL_BASED_OUTPATIENT_CLINIC_OR_DEPARTMENT_OTHER): Payer: Self-pay | Admitting: Urology

## 2023-06-12 DIAGNOSIS — N201 Calculus of ureter: Secondary | ICD-10-CM | POA: Diagnosis not present

## 2023-08-18 ENCOUNTER — Ambulatory Visit: Payer: Medicare Other | Admitting: Pulmonary Disease

## 2023-08-18 ENCOUNTER — Encounter: Payer: Self-pay | Admitting: Pulmonary Disease

## 2023-08-18 VITALS — BP 140/80 | HR 65 | Ht 63.25 in | Wt 120.2 lb

## 2023-08-18 DIAGNOSIS — J479 Bronchiectasis, uncomplicated: Secondary | ICD-10-CM

## 2023-08-18 DIAGNOSIS — R058 Other specified cough: Secondary | ICD-10-CM

## 2023-08-18 DIAGNOSIS — R0989 Other specified symptoms and signs involving the circulatory and respiratory systems: Secondary | ICD-10-CM | POA: Diagnosis not present

## 2023-08-18 NOTE — Patient Instructions (Signed)
[employer] choice is the green flutter valve that can be disassembled and cleaned  Will order via a DME company - may be cheaper on [employer]

## 2023-08-18 NOTE — Progress Notes (Signed)
@Patient  ID: Lynn Kim, female    DOB: 04-03-1941, 82 y.o.   MRN: [id number]  Chief Complaint  Patient presents with   Follow-up    F/U visit. Pt c/o Chest Congestion and Dry Cough for 4 weeks.    Referring provider: Laurann Montana, MD  HPI:   82 y.o. 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  Most recent urology note reviewed.  Op note from lithotripsy reviewed.  Overall doing well.  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  Needs a replacement.  Difficult to clean.  We discussed replacement options.  About 4 weeks increased chest congestion cough.  Dry cough.  She attributes the to nasal allergies, seasonal allergies.  Happens frequently.  Not particular bothersome.  Treated with antihistamines and Mucinex.  HPI at initial visit: Patient feels fine.  No significant dyspnea.  Does have occasional cough.  Noted that she felt, short of breath unable to complete full lines when singing in choir.  She is an avid exerciser exercising multiple times a day without issue.  She has improved her ability to sing with voice strengthening exercises.  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  Repeat CT scan of the chest high-resolution 09/2022 reveals in my opinion and my interpretation on review improved nodule opacities on the right, unchanged on the left, similar bronchiectasis, evidence of mild air trapping on expiratory images.  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  We discussed at length the natural progression of NTM disease.  The likelihood of no significant changes in her symptoms over time.  Unlikely to cause significant symptoms with the exception of cough is related to bronchiectasis.  We discussed at length strategies to mitigate worsening cough in the future.  Stressed the importance of airway  clearance.   Questionaires / Pulmonary Flowsheets:   ACT:      No data to display          MMRC:     No data to display          Epworth:      No data to display          Tests:   FENO:  No results found for: "NITRICOXIDE"  PFT:     No data to display          WALK:      No data to display          Imaging: Personally reviewed and as per EMR and discussion in this note No results found.  Lab Results: Personally reviewed, more recent labs demonstrate eosinophils of 600 CBC    Component Value Date/Time   WBC 6.3 05/12/2017 0800   RBC 4.74 05/12/2017 0800   HGB 14.6 05/12/2017 0800   HCT 43.5 05/12/2017 0800   PLT 242 05/12/2017 0800   MCV 91.8 05/12/2017 0800   MCH 30.8 05/12/2017 0800   MCHC 33.6 05/12/2017 0800   RDW 13.6 05/12/2017 0800   LYMPHSABS 1.5 05/12/2017 0800   MONOABS 0.5 05/12/2017 0800   EOSABS 0.0 05/12/2017 0800   BASOSABS 0.0 05/12/2017 0800    BMET    Component Value Date/Time   NA 139 05/12/2017 0800   K 3.7 05/12/2017 0800   CL 103 05/12/2017 0800   CO2 26 05/12/2017 0800   GLUCOSE 121 (H) 05/12/2017 0800   BUN 15 05/12/2017 0800  CREATININE 0.78 05/12/2017 0800   CALCIUM 9.2 05/12/2017 0800   GFRNONAA >60 05/12/2017 0800   GFRAA >60 05/12/2017 0800    BNP No results found for: "BNP"  ProBNP No results found for: "PROBNP"  Specialty Problems   None   Allergies  Allergen Reactions   Egg-Derived Products Nausea And Vomiting   Food     Almonds, cane sugar, grains    Influenza Vac Split Quad     Other Reaction(s): egg yolk allergy   Molds & Smuts     Other Reaction(s): via allergy test   Other Nausea And Vomiting   Peanut Oil     Other Reaction(s): thru a blood test   Sugar-Protein-Starch Other (See Comments)   Tobacco     Other Reaction(s): via allergy   Bacitracin-Polymyxin B Rash   E-Mycin [Erythromycin] Rash   Erythromycin Ethylsuccinate Rash   Lanolin Rash   Latex Rash    Other  Reaction(s): soreness   Penicillin G Rash   Penicillins Rash   Petrolatum Rash   Sertraline Hcl Anxiety   Sulfa Antibiotics Rash    Immunization History  Administered Date(s) Administered   Influenza Split 07/26/2013, 06/27/2022   Influenza, Quadrivalent, Recombinant, Inj, Pf 08/22/2019, 07/04/2020, 07/11/2021   PFIZER(Purple Top)SARS-COV-2 Vaccination 10/19/2019, 11/09/2019, 07/30/2020   Pneumococcal Conjugate-13 09/08/2014   Pneumococcal Polysaccharide-23 01/05/2009   Td 03/05/2021   Td (Adult) 01/15/1999   Tdap 01/22/2011   Zoster Recombinant(Shingrix) 08/14/2017   Zoster, Live 12/06/2006, 04/27/2017, 08/14/2017    Past Medical History:  Diagnosis Date   Anxiety    PAST HISTORY   Arthritis    bilateral hands   Depression    genetic gene   Heart murmur    CHILDHOOD FUNCTIONAL HEART MURMUR   Osteoporosis    Umbilical hernia    Vertigo     Tobacco History: Social History   Tobacco Use  Smoking Status Former  Smokeless Tobacco Former   Quit date: 04/06/1963   Counseling given: Not Answered   Continue to not smoke  Outpatient Encounter Medications as of 08/18/2023  Medication Sig   Alpha-Lipoic Acid 200 MG CAPS 1 capsule Orally Three times a day   azelastine (ASTELIN) 0.1 % nasal spray Place 2 sprays into both nostrils 2 (two) times daily.   Black Pepper-Turmeric (TURMERIC COMPLEX/BLACK PEPPER) 3-500 MG CAPS 1 capusle Orally twice a day   CALCIUM-MAGNESIUM-VITAMIN D PO Take 1 tablet by mouth 2 (two) times daily.   Cholecalciferol (VITAMIN D3) 2000 UNITS TABS Take 1 tablet by mouth 2 (two) times daily. 1000   cyanocobalamin (VITAMIN B12) 1000 MCG tablet Take 1,000 mcg by mouth daily.   escitalopram (LEXAPRO) 5 MG tablet Take 5 mg by mouth daily.   L-Theanine 100 MG CAPS Take 100 mg by mouth 2 (two) times daily.   loratadine (CLARITIN) 10 MG tablet Take 10 mg by mouth daily.   Menaquinone-7 (VITAMIN K2 PO) Take by mouth.   Nutritional Supplements (DHEA PO) Take  5 mg by mouth daily.   PRESCRIPTION MEDICATION Apply 1 application topically daily. Bi-est cream - compounded at [facility]   PRESCRIPTION MEDICATION Apply 1 application topically 3 (three) times a week. Estriol - Testosterone Cream - three times weekly (Sunday, Tuesday, Friday) Compounded at [facility]   progesterone (PROMETRIUM) 100 MG capsule Take 100 mg by mouth daily.   psyllium (METAMUCIL) 58.6 % powder Take 1 packet by mouth 3 (three) times daily.   valACYclovir (VALTREX) 500 MG tablet Take 500  mg by mouth as needed.   [DISCONTINUED] CALCIUM PO Take by mouth.   [DISCONTINUED] Calcium-Vitamin D-Vitamin K 500-500-40 MG-UNT-MCG CHEW Chew 1 tablet by mouth daily with lunch.   No facility-administered encounter medications on file as of 08/18/2023.     Review of Systems  Review of Systems  No chest pain with exertion.  No orthopnea or PND.  Comprehensive review of systems otherwise negative. Physical Exam  BP (!) 140/80 (BP Location: Right Arm, Cuff Size: Normal)   Pulse 65   Ht 5' 3.25" (1.607 m)   Wt 120 lb 3.2 oz (54.5 kg)   SpO2 97%   BMI 21.12 kg/m   Wt Readings from Last 5 Encounters:  08/18/23 120 lb 3.2 oz (54.5 kg)  05/29/23 118 lb 3.2 oz (53.6 kg)  02/16/23 124 lb (56.2 kg)  12/25/22 125 lb (56.7 kg)  11/14/22 126 lb 9.6 oz (57.4 kg)    BMI Readings from Last 5 Encounters:  08/18/23 21.12 kg/m  05/29/23 20.94 kg/m  02/16/23 21.97 kg/m  12/25/22 22.14 kg/m  11/14/22 22.43 kg/m     Physical Exam General: Sitting in chair, no acute distress Eyes: EOMI, no icterus Neck: Supple, no JVP Pulmonary: Clear, no work of breathing Cardiovascular: Warm, no edema Abdomen: Nondistended, bowel sounds present MSK: No synovitis, no joint effusion Neuro: Normal gait, no weakness Psych: Normal mood, full affect   Assessment & Plan:   Scattered nodular opacities in bilateral lower lobes: 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  Possible sequela  of aspiration.  She  denies significant aspiration symptoms.  Mild bronchiectasis: Bilateral lower lobes.  Discussed unlikely to cause significant problems.  Even NTM.  Recommend airway clearance, keeping mucus moving.  She reports excellent adherence to flutter valve, encouraged to continue.  New DME order for [employer] choice so she can disassembling clean.  Dyspnea on exertion: Seems mild.  Has had air trapping on CT expiratory images high-resolution.  Eosinophils elevated in the future.  Discussed considering inhaler therapy in the future.  Will hold off for now given relative lack of symptoms.  Dry cough, nasal allergies, congestion: Suspect related to seasonal allergies.  Seasonal changes frequently cause this.  Treating symptomatically antihistamine and Mucinex.  Symptoms adequately treated at this time.  Consider inhalers as discussed above.   Return in about 1 year (around 08/17/2024) for f/u Dr. Judeth Horn.   Karren Burly, MD 08/18/2023

## 2023-08-21 ENCOUNTER — Ambulatory Visit
Admission: RE | Admit: 2023-08-21 | Discharge: 2023-08-21 | Disposition: A | Discharge status: Home / Self Care | Payer: Medicare Other | Source: Ambulatory Visit | Attending: Family Medicine | Admitting: Family Medicine

## 2023-08-21 DIAGNOSIS — M810 Age-related osteoporosis without current pathological fracture: Secondary | ICD-10-CM

## 2023-08-21 DIAGNOSIS — M8588 Other specified disorders of bone density and structure, other site: Secondary | ICD-10-CM | POA: Diagnosis not present

## 2023-09-03 DIAGNOSIS — L738 Other specified follicular disorders: Secondary | ICD-10-CM | POA: Diagnosis not present

## 2023-09-03 DIAGNOSIS — L820 Inflamed seborrheic keratosis: Secondary | ICD-10-CM | POA: Diagnosis not present

## 2023-09-03 DIAGNOSIS — D225 Melanocytic nevi of trunk: Secondary | ICD-10-CM | POA: Diagnosis not present

## 2023-09-03 DIAGNOSIS — L578 Other skin changes due to chronic exposure to nonionizing radiation: Secondary | ICD-10-CM | POA: Diagnosis not present

## 2023-09-03 DIAGNOSIS — L821 Other seborrheic keratosis: Secondary | ICD-10-CM | POA: Diagnosis not present

## 2023-09-03 DIAGNOSIS — D1801 Hemangioma of skin and subcutaneous tissue: Secondary | ICD-10-CM | POA: Diagnosis not present

## 2023-09-03 DIAGNOSIS — L814 Other melanin hyperpigmentation: Secondary | ICD-10-CM | POA: Diagnosis not present

## 2023-10-07 DIAGNOSIS — R7303 Prediabetes: Secondary | ICD-10-CM | POA: Diagnosis not present

## 2023-10-07 DIAGNOSIS — R319 Hematuria, unspecified: Secondary | ICD-10-CM | POA: Diagnosis not present

## 2023-10-07 DIAGNOSIS — E039 Hypothyroidism, unspecified: Secondary | ICD-10-CM | POA: Diagnosis not present

## 2023-10-07 DIAGNOSIS — M810 Age-related osteoporosis without current pathological fracture: Secondary | ICD-10-CM | POA: Diagnosis not present

## 2023-11-02 ENCOUNTER — Other Ambulatory Visit: Payer: Self-pay | Admitting: Family Medicine

## 2023-11-02 DIAGNOSIS — R221 Localized swelling, mass and lump, neck: Secondary | ICD-10-CM | POA: Diagnosis not present

## 2023-11-02 DIAGNOSIS — G720 Drug-induced myopathy: Secondary | ICD-10-CM | POA: Diagnosis not present

## 2023-11-02 DIAGNOSIS — I2510 Atherosclerotic heart disease of native coronary artery without angina pectoris: Secondary | ICD-10-CM | POA: Diagnosis not present

## 2023-11-02 DIAGNOSIS — R7303 Prediabetes: Secondary | ICD-10-CM | POA: Diagnosis not present

## 2023-11-05 DIAGNOSIS — L821 Other seborrheic keratosis: Secondary | ICD-10-CM | POA: Diagnosis not present

## 2023-11-05 DIAGNOSIS — L820 Inflamed seborrheic keratosis: Secondary | ICD-10-CM | POA: Diagnosis not present

## 2023-11-06 ENCOUNTER — Ambulatory Visit
Admission: RE | Admit: 2023-11-06 | Discharge: 2023-11-06 | Disposition: A | Discharge status: Home / Self Care | Payer: Medicare Other | Source: Ambulatory Visit | Attending: Family Medicine | Admitting: Family Medicine

## 2023-11-06 DIAGNOSIS — R221 Localized swelling, mass and lump, neck: Secondary | ICD-10-CM

## 2023-12-23 DIAGNOSIS — N200 Calculus of kidney: Secondary | ICD-10-CM | POA: Diagnosis not present

## 2024-02-17 DIAGNOSIS — H26491 Other secondary cataract, right eye: Secondary | ICD-10-CM | POA: Diagnosis not present

## 2024-03-09 DIAGNOSIS — H26491 Other secondary cataract, right eye: Secondary | ICD-10-CM | POA: Diagnosis not present

## 2024-04-06 DIAGNOSIS — R7303 Prediabetes: Secondary | ICD-10-CM | POA: Diagnosis not present

## 2024-04-06 DIAGNOSIS — E039 Hypothyroidism, unspecified: Secondary | ICD-10-CM | POA: Diagnosis not present

## 2024-05-02 DIAGNOSIS — Z0000 Encounter for general adult medical examination without abnormal findings: Secondary | ICD-10-CM | POA: Diagnosis not present

## 2024-05-02 DIAGNOSIS — N3281 Overactive bladder: Secondary | ICD-10-CM | POA: Diagnosis not present

## 2024-05-02 DIAGNOSIS — M810 Age-related osteoporosis without current pathological fracture: Secondary | ICD-10-CM | POA: Diagnosis not present

## 2024-05-02 DIAGNOSIS — E559 Vitamin D deficiency, unspecified: Secondary | ICD-10-CM | POA: Diagnosis not present

## 2024-05-02 DIAGNOSIS — M419 Scoliosis, unspecified: Secondary | ICD-10-CM | POA: Diagnosis not present

## 2024-05-02 DIAGNOSIS — J479 Bronchiectasis, uncomplicated: Secondary | ICD-10-CM | POA: Diagnosis not present

## 2024-05-02 DIAGNOSIS — I2510 Atherosclerotic heart disease of native coronary artery without angina pectoris: Secondary | ICD-10-CM | POA: Diagnosis not present

## 2024-05-02 DIAGNOSIS — R7303 Prediabetes: Secondary | ICD-10-CM | POA: Diagnosis not present

## 2024-05-06 ENCOUNTER — Other Ambulatory Visit: Payer: Self-pay | Admitting: Family Medicine

## 2024-05-06 DIAGNOSIS — N950 Postmenopausal bleeding: Secondary | ICD-10-CM

## 2024-05-09 DIAGNOSIS — Z961 Presence of intraocular lens: Secondary | ICD-10-CM | POA: Diagnosis not present

## 2024-05-09 DIAGNOSIS — H5213 Myopia, bilateral: Secondary | ICD-10-CM | POA: Diagnosis not present

## 2024-05-12 DIAGNOSIS — R9389 Abnormal findings on diagnostic imaging of other specified body structures: Secondary | ICD-10-CM | POA: Diagnosis not present

## 2024-06-09 DIAGNOSIS — R9389 Abnormal findings on diagnostic imaging of other specified body structures: Secondary | ICD-10-CM | POA: Diagnosis not present

## 2024-07-28 DIAGNOSIS — R9389 Abnormal findings on diagnostic imaging of other specified body structures: Secondary | ICD-10-CM | POA: Diagnosis not present

## 2024-08-01 ENCOUNTER — Encounter (HOSPITAL_COMMUNITY): Payer: Self-pay | Admitting: Obstetrics and Gynecology

## 2024-08-01 NOTE — Progress Notes (Signed)
 Spoke w/ via phone for pre-op interview--- Lynn Kim needs dos----  NONE per anesthesia. Surgeon orders requested 08/01/24.       Kim results------ COVID test -----patient states asymptomatic no test needed Arrive at -------1230 NPO after MN NO Solid Food.  Clear liquids from MN until---1130 Pre-Surgery Ensure or G2:  Med rec completed Medications to take morning of surgery -----Lexapro Diabetic medication -----  GLP1 agonist last dose: GLP1 instructions:  Patient instructed no nail polish to be worn day of surgery Patient instructed to bring photo id and insurance card day of surgery Patient aware to have Driver (ride ) / caregiver    for 24 hours after surgery - Daughter Lynn Kim Patient Special Instructions ----- Pre-Op special Instructions -----  Patient verbalized understanding of instructions that were given at this phone interview. Patient denies chest pain, sob, fever, cough at the interview.

## 2024-08-01 NOTE — H&P (Signed)
 Lynn Kim is a 83 y.o. female, P: 2-0-0-2 who presents for hysteroscopy because of post menopausal bleeding and a thickened endometrium. Patient was referred by Dr. Rosalva at [facility] for evaluation of her endometrium due to a  history of  light spotting in August 2025 for 2 days.  Blood stain color ranged from brown to red,   was noticed with wiping and on pad,  and appeared to increase with exercise.  A pelvic ultrasound at that time showed a uterine volume of 55 cc and a slightly irregular endometrium measuring 6.2 mm; both ovaries appeared normal. The patient declined an endometrial biopsy for endometrial sampling preferring, instead,  surgical evaluation and management.    Past Medical History  OB History: G: 2;  P: 2-0-0-2  GYN History:  Last PAP smear: 2014 -normal;  History of HSV-2  Medical History: Coronary Artery Disease, Depression, Overactive Bladder, Vitamin D Deficiency, Drug-induced myopathy, Scoliosis and Osteoporosis  Surgical History: Appendectomy; Hysteroscopy, Dilatation & Curettage; Umbilical Hernia Repair and Renal Stone Surgery   Family History: Stroke, Breast Cancer, Pancreatic Cancer, Colon Polyp and Coronary Artery Disease  Social History:  Non-smoker  Medications: Azelastine  137 mcg Nasal Spray 1 spray in each nostril dailu Escitalopram 5 mg daily Valacyclovir 500 mg daily Vitamin D daily L-Theanine daily Vitamin K daily B-complex daily Tesosterone 2% Cream 4 times a week Micronized Progesterone  100 mg daily Bi-est 2 clicks daily  Allergies  Allergen Reactions   Egg Protein-Containing Drug Products Nausea And Vomiting   Food     Almonds, cane sugar, grains    Influenza Vac Split Quad     Other Reaction(s): egg yolk allergy   Molds & Smuts     Other Reaction(s): via allergy test   Other Nausea And Vomiting   Peanut Oil     Other Reaction(s): thru a blood test   Sugar-Protein-Starch Other (See Comments)   Tobacco     Other Reaction(s): via  allergy   Bacitracin-Polymyxin B Rash   E-Mycin [Erythromycin] Rash   Erythromycin Ethylsuccinate Rash   Lanolin Rash   Latex Rash    Other Reaction(s): soreness   Penicillin G Rash   Penicillins Rash   Petrolatum Rash   Sertraline Hcl Anxiety   Sulfa Antibiotics Rash    ROS: Denies headache, vision changes, nasal congestion, dysphagia, tinnitus, dizziness, hoarseness, cough,  chest pain, shortness of breath, nausea, vomiting, diarrhea,constipation,  urinary frequency, urgency  dysuria, hematuria, vaginitis symptoms, pelvic pain, swelling of joints,easy bruising,  myalgias, arthralgias, skin rashes, unexplained weight loss and except as is mentioned in the history of present illness, patient's review of systems is otherwise negative.    Physical Exam  Bp: 136/68;  Weight: 126.6 lbs; Height: 5'2; BMI: 23.2  Neck: supple without masses or thyromegaly Lungs: clear to auscultation Heart: regular rate and rhythm Abdomen: soft, non-tender and no organomegaly Pelvic:EGBUS- wnl; vagina-normal,  uterus-normal size, cervix without lesions or motion tenderness; adnexae-no tenderness or masses Extremities:  no clubbing, cyanosis or edema   Assesment: Post Menopausal Bleeding                      Thickened Endometrium   Disposition:  A discussion was held with patient regarding the indication for her procedure(s) along with the risks, which include but are not limited to: reaction to anesthesia, damage to adjacent organs (bladder, bowel and ureters)  infection and excessive bleeding. The patient verbalized understanding of these risks and has consented to proceed  with Hysteroscopy, Dilatation and Curettage at [facility] on August 04, 2024.   CSN# [id number]   Raymona Boss J. Perri, PA-C  for Dr. Nena LABOR. Rivard

## 2024-08-04 ENCOUNTER — Ambulatory Visit (HOSPITAL_BASED_OUTPATIENT_CLINIC_OR_DEPARTMENT_OTHER)

## 2024-08-04 ENCOUNTER — Other Ambulatory Visit: Payer: Self-pay

## 2024-08-04 ENCOUNTER — Encounter (HOSPITAL_COMMUNITY): Admission: RE | Disposition: A | Payer: Self-pay | Source: Home / Self Care | Attending: Obstetrics and Gynecology

## 2024-08-04 ENCOUNTER — Ambulatory Visit (HOSPITAL_COMMUNITY)
Admission: RE | Admit: 2024-08-04 | Discharge: 2024-08-04 | Disposition: A | Discharge status: Home / Self Care | Attending: Obstetrics and Gynecology | Admitting: Obstetrics and Gynecology

## 2024-08-04 ENCOUNTER — Ambulatory Visit (HOSPITAL_COMMUNITY)

## 2024-08-04 ENCOUNTER — Encounter (HOSPITAL_COMMUNITY): Payer: Self-pay | Admitting: Obstetrics and Gynecology

## 2024-08-04 DIAGNOSIS — N950 Postmenopausal bleeding: Secondary | ICD-10-CM

## 2024-08-04 DIAGNOSIS — Z87891 Personal history of nicotine dependence: Secondary | ICD-10-CM | POA: Diagnosis not present

## 2024-08-04 HISTORY — DX: Prediabetes: R73.03

## 2024-08-04 HISTORY — PX: HYSTEROSCOPY WITH D & C: SHX1775

## 2024-08-04 LAB — CBC
HCT: 43.9 % (ref 36.0–46.0)
Hemoglobin: 14.6 g/dL (ref 12.0–15.0)
MCH: 30.2 pg (ref 26.0–34.0)
MCHC: 33.3 g/dL (ref 30.0–36.0)
MCV: 90.9 fL (ref 80.0–100.0)
Platelets: 231 K/uL (ref 150–400)
RBC: 4.83 MIL/uL (ref 3.87–5.11)
RDW: 12.7 % (ref 11.5–15.5)
WBC: 10.1 K/uL (ref 4.0–10.5)
nRBC: 0.2 % (ref 0.0–0.2)

## 2024-08-04 SURGERY — DILATATION AND CURETTAGE /HYSTEROSCOPY
Anesthesia: General

## 2024-08-04 MED ORDER — ORAL CARE MOUTH RINSE: 15.0000 mL | Freq: Once | OROMUCOSAL | Status: AC

## 2024-08-04 MED ORDER — KETOROLAC TROMETHAMINE 15 MG/ML IJ SOLN
15.0000 mg | INTRAMUSCULAR | Status: AC
  Administered 2024-08-04: 15 mg via INTRAVENOUS

## 2024-08-04 MED ORDER — FENTANYL CITRATE (PF) 100 MCG/2ML IJ SOLN: 25.0000 ug | INTRAMUSCULAR | Status: DC | PRN

## 2024-08-04 MED ORDER — CHLORHEXIDINE GLUCONATE 0.12 % MT SOLN
15.0000 mL | Freq: Once | OROMUCOSAL | Status: AC
  Administered 2024-08-04: 15 mL via OROMUCOSAL

## 2024-08-04 MED ORDER — PROPOFOL 10 MG/ML IV BOLUS
INTRAVENOUS | Status: DC | PRN
  Administered 2024-08-04: 150 mg via INTRAVENOUS
  Administered 2024-08-04: 50 mg via INTRAVENOUS

## 2024-08-04 MED ORDER — ENSURE PRE-SURGERY PO LIQD: 296.0000 mL | Freq: Once | ORAL | Status: DC

## 2024-08-04 MED ORDER — POVIDONE-IODINE 10 % EX SWAB
2.0000 | Freq: Once | CUTANEOUS | Status: AC
  Administered 2024-08-04: 2 via TOPICAL

## 2024-08-04 MED ORDER — FENTANYL CITRATE (PF) 250 MCG/5ML IJ SOLN
INTRAMUSCULAR | Status: DC | PRN
  Administered 2024-08-04: 50 ug via INTRAVENOUS

## 2024-08-04 MED ORDER — MIDAZOLAM HCL (PF) 2 MG/2ML IJ SOLN: 0.5000 mg | Freq: Once | INTRAMUSCULAR | Status: DC | PRN

## 2024-08-04 MED ORDER — DEXAMETHASONE SOD PHOSPHATE PF 10 MG/ML IJ SOLN
INTRAMUSCULAR | Status: DC | PRN
  Administered 2024-08-04: 10 mg via INTRAVENOUS

## 2024-08-04 MED ORDER — SODIUM CHLORIDE 0.9 % IR SOLN
Status: DC | PRN
  Administered 2024-08-04: 3000 mL

## 2024-08-04 MED ORDER — ONDANSETRON HCL 4 MG/2ML IJ SOLN
INTRAMUSCULAR | Status: AC
Start: 2024-08-04 — End: 2024-08-04
  Filled 2024-08-04: qty 2

## 2024-08-04 MED ORDER — CHLOROPROCAINE HCL 1 % IJ SOLN
INTRAMUSCULAR | Status: DC | PRN
  Administered 2024-08-04: 10 mL

## 2024-08-04 MED ORDER — CHLORHEXIDINE GLUCONATE 0.12 % MT SOLN
OROMUCOSAL | Status: AC
Start: 2024-08-04 — End: 2024-08-04
  Filled 2024-08-04: qty 15

## 2024-08-04 MED ORDER — LIDOCAINE 2% (20 MG/ML) 5 ML SYRINGE
INTRAMUSCULAR | Status: AC
Start: 2024-08-04 — End: 2024-08-04
  Filled 2024-08-04: qty 5

## 2024-08-04 MED ORDER — OXYCODONE HCL 5 MG/5ML PO SOLN: 5.0000 mg | Freq: Once | ORAL | Status: DC | PRN

## 2024-08-04 MED ORDER — OXYCODONE HCL 5 MG PO TABS: 5.0000 mg | ORAL_TABLET | Freq: Once | ORAL | Status: DC | PRN

## 2024-08-04 MED ORDER — ONDANSETRON HCL 4 MG/2ML IJ SOLN
INTRAMUSCULAR | Status: DC | PRN
  Administered 2024-08-04: 4 mg via INTRAVENOUS

## 2024-08-04 MED ORDER — PROPOFOL 10 MG/ML IV BOLUS
INTRAVENOUS | Status: AC
Start: 2024-08-04 — End: 2024-08-04
  Filled 2024-08-04: qty 20

## 2024-08-04 MED ORDER — GABAPENTIN 300 MG PO CAPS: 300.0000 mg | ORAL_CAPSULE | ORAL | Status: DC

## 2024-08-04 MED ORDER — FENTANYL CITRATE (PF) 100 MCG/2ML IJ SOLN
INTRAMUSCULAR | Status: AC
  Filled 2024-08-04: qty 2

## 2024-08-04 MED ORDER — LACTATED RINGERS IV SOLN
INTRAVENOUS | Status: DC
Start: 2024-08-04 — End: 2024-08-04
  Administered 2024-08-04: 1000 mL via INTRAVENOUS

## 2024-08-04 MED ORDER — MEPERIDINE HCL 25 MG/ML IJ SOLN: 6.2500 mg | INTRAMUSCULAR | Status: DC | PRN

## 2024-08-04 MED ORDER — LIDOCAINE 2% (20 MG/ML) 5 ML SYRINGE
INTRAMUSCULAR | Status: DC | PRN
  Administered 2024-08-04: 40 mg via INTRAVENOUS

## 2024-08-04 MED ORDER — PHENYLEPHRINE 80 MCG/ML (10ML) SYRINGE FOR IV PUSH (FOR BLOOD PRESSURE SUPPORT)
PREFILLED_SYRINGE | INTRAVENOUS | Status: AC
Start: 2024-08-04 — End: 2024-08-04
  Filled 2024-08-04: qty 10

## 2024-08-04 SURGICAL SUPPLY — 18 items
CATH ROBINSON RED A/P 16FR (CATHETERS) IMPLANT
COVER MAYO STAND STRL (DRAPES) ×1 IMPLANT
CURETTE PIPELLE ENDOMTRL SUCTN (MISCELLANEOUS) IMPLANT
DEVICE MYOSURE LITE (MISCELLANEOUS) IMPLANT
DEVICE MYOSURE REACH (MISCELLANEOUS) IMPLANT
DILATOR CANAL MILEX (MISCELLANEOUS) ×1 IMPLANT
GLOVE ECLIPSE 6.5 STRL STRAW (GLOVE) ×1 IMPLANT
GLOVE SURG UNDER POLY LF SZ7 (GLOVE) ×2 IMPLANT
GOWN STRL REUS W/ TWL LRG LVL3 (GOWN DISPOSABLE) ×1 IMPLANT
KIT PROCED FLUENT PRO FLT212S (KITS) ×1 IMPLANT
KIT TURNOVER KIT B (KITS) ×1 IMPLANT
NDL ASPIRATION 22 (NEEDLE) ×1 IMPLANT
NEEDLE ASPIRATION 22 (NEEDLE) ×1 IMPLANT
PACK VAGINAL MINOR WOMEN LF (CUSTOM PROCEDURE TRAY) ×1 IMPLANT
PAD OB MATERNITY 11 LF (PERSONAL CARE ITEMS) ×1 IMPLANT
SEAL ROD LENS SCOPE MYOSURE (ABLATOR) ×1 IMPLANT
TOWEL GREEN STERILE FF (TOWEL DISPOSABLE) ×1 IMPLANT
UNDERPAD 30X36 HEAVY ABSORB (UNDERPADS AND DIAPERS) ×1 IMPLANT

## 2024-08-04 NOTE — Transfer of Care (Signed)
 Immediate Anesthesia Transfer of Care Note  Patient: Lynn Kim  Procedure(s) Performed: DILATATION AND CURETTAGE /HYSTEROSCOPY  Patient Location: PACU  Anesthesia Type:General  Level of Consciousness: sedated  Airway & Oxygen Therapy: Patient Spontanous Breathing and Patient connected to face mask oxygen  Post-op Assessment: Report given to RN and Post -op Vital signs reviewed and stable  Post vital signs: Reviewed and stable  Last Vitals:  Vitals Value Taken Time  BP 148/52 08/04/24 15:15  Temp    Pulse 98 08/04/24 15:17  Resp 15 08/04/24 15:17  SpO2 100 % 08/04/24 15:17  Vitals shown include unfiled device data.  Last Pain:  Vitals:   08/04/24 1227  TempSrc: Oral  PainSc: 0-No pain      Patients Stated Pain Goal: 6 (08/04/24 1227)  Complications: No notable events documented.

## 2024-08-04 NOTE — Anesthesia Procedure Notes (Signed)
 Procedure Name: LMA Insertion Date/Time: 08/04/2024 2:31 PM  Performed by: Elby Raelene SAUNDERS, CRNAPre-anesthesia Checklist: Patient identified, Emergency Drugs available, Suction available and Patient being monitored Patient Re-evaluated:Patient Re-evaluated prior to induction Oxygen Delivery Method: Circle System Utilized Preoxygenation: Pre-oxygenation with 100% oxygen Induction Type: IV induction Ventilation: Mask ventilation without difficulty LMA: LMA inserted LMA Size: 4.0 Number of attempts: 1 Airway Equipment and Method: Bite block Placement Confirmation: positive ETCO2 Tube secured with: Tape Dental Injury: Teeth and Oropharynx as per pre-operative assessment

## 2024-08-04 NOTE — Discharge Instructions (Addendum)
 POST-OPERATIVE INSTRUCTIONS TO PATIENT  Call [facility] at [phone]  for excessive pain, bleeding or temperature greater than or equal to 100.4 degrees (orally).    No driving for 24 hours  Pain management:  Use local heat and over the counter medication if experiencing pain       Diet: normal  Bathing: may shower day after surgery   Return to Dr. Darcel on 08/23/24 at 11:30 am  Return to work: To be determined at post operative visit.   Nena Darcel MD   Post Anesthesia Home Care Instructions  Activity: Get plenty of rest for the remainder of the day. A responsible adult should stay with you for 24 hours following the procedure.  For the next 24 hours, DO NOT: -Drive a car -Advertising copywriter -Drink alcoholic beverages -Take any medication unless instructed by your physician -Make any legal decisions or sign important papers.  Meals: Start with liquid foods such as gelatin or soup. Progress to regular foods as tolerated. Avoid greasy, spicy, heavy foods. If nausea and/or vomiting occur, drink only clear liquids until the nausea and/or vomiting subsides. Call your physician if vomiting continues.  Special Instructions/Symptoms: Your throat may feel dry or sore from the anesthesia or the breathing tube placed in your throat during surgery. If this causes discomfort, gargle with warm salt water. The discomfort should disappear within 24 hours.

## 2024-08-04 NOTE — Discharge Instr - Supplementary Instructions (Signed)
 May take Ibuprofen,Advil or Aleve  after 9pm if needed for discomfort.

## 2024-08-04 NOTE — Anesthesia Preprocedure Evaluation (Addendum)
 Anesthesia Evaluation  Patient identified by MRN, date of birth, ID band Patient awake    Reviewed: Allergy & Precautions, NPO status , Patient's Chart, lab work & pertinent test results  History of Anesthesia Complications Negative for: history of anesthetic complications  Airway Mallampati: III  TM Distance: >3 FB Neck ROM: Full  Mouth opening: Limited Mouth Opening  Dental  (+) Dental Advisory Given   Pulmonary former smoker   breath sounds clear to auscultation       Cardiovascular (-) angina negative cardio ROS  Rhythm:Regular Rate:Normal     Neuro/Psych   Anxiety Depression    negative neurological ROS     GI/Hepatic negative GI ROS, Neg liver ROS,,,  Endo/Other  negative endocrine ROS    Renal/GU negative Renal ROS     Musculoskeletal   Abdominal   Peds  Hematology Hb 14.6, plt 231k   Anesthesia Other Findings   Reproductive/Obstetrics                              Anesthesia Physical Anesthesia Plan  ASA: 2  Anesthesia Plan: General   Post-op Pain Management: Tylenol  PO (pre-op)*   Induction: Intravenous  PONV Risk Score and Plan: 3 and Ondansetron , Dexamethasone  and Treatment may vary due to age or medical condition  Airway Management Planned: LMA  Additional Equipment: None  Intra-op Plan:   Post-operative Plan:   Informed Consent: I have reviewed the patients History and Physical, chart, labs and discussed the procedure including the risks, benefits and alternatives for the proposed anesthesia with the patient or authorized representative who has indicated his/her understanding and acceptance.     Dental advisory given  Plan Discussed with: CRNA and Surgeon  Anesthesia Plan Comments:          Anesthesia Quick Evaluation

## 2024-08-04 NOTE — Anesthesia Postprocedure Evaluation (Signed)
 Anesthesia Post Note  Patient: Lynn Kim  Procedure(s) Performed: DILATATION AND CURETTAGE /HYSTEROSCOPY     Patient location during evaluation: PACU Anesthesia Type: General Level of consciousness: awake and alert, patient cooperative and oriented Pain management: pain level controlled Vital Signs Assessment: post-procedure vital signs reviewed and stable Respiratory status: spontaneous breathing, nonlabored ventilation and respiratory function stable Cardiovascular status: blood pressure returned to baseline and stable Postop Assessment: no apparent nausea or vomiting Anesthetic complications: no   There were no known notable events for this encounter.  Last Vitals:  Vitals:   08/04/24 1545 08/04/24 1546  BP:  (!) 145/62  Pulse: 90 91  Resp: 13 17  Temp:  36.8 C  SpO2: 93% 93%    Last Pain:  Vitals:   08/04/24 1227  TempSrc: Oral  PainSc: 0-No pain                 [name],E. [name]

## 2024-08-04 NOTE — Op Note (Signed)
 Preop diagnosis: Post menopausal bleeding  Postop diagnosis: same  Anesthesia: IV sedation  Anesthesiologist: Dr. Kelly Mace  Procedure: Hysteroscopy with dilatation and curettage  Surgeon: Dr. Nena Vernecia Umble  Procedure: After being informed of the planned procedure with possible complications including bleeding, infection and uterine perforation, informed consent was obtained and patient was taken to or #6.  She was given IV sedation anesthesia without complication. She was placed in a dorsal decubitus position, prepped and draped in the sterile fashion and  her bladder was emptied with a red rubber catheter. Pelvic exam reveals Small anteverted uterus with 2 normal adnexa.  A speculum is inserted in the vagina. The cervix was grasped with a tenaculum forcep placed on the anterior lip.We proceed with a paracervical block using 1% Nesacaine, 10 cc. Uterus is sounded at 5 cm. The cervix is then easily dilated using Hegar dilator until # 25. This allows for easy placement of a diagnostic hysteroscope. With perfusion of Normal Saline at a maximum pressure of 80 mmHg, we are able to evaluate the entire uterine cavity.  Observation: Endometrium appears thin with scattered area of microcysts, 1 predominant in the left cornua. Using hysteroscopic scissors, we transects that area so it will be included in our specimen.  Normal vascularity and 2 normal tubal ostia. We then removed our instrumentation. Using a sharp curette, we proceed with curettage of the endometrial cavity which returns a small amount of normal-appearing endometrium.  Instruments are then removed. Instrument and sponge count is complete x2. Estimated blood loss is minimal. Water deficit is 150 cc of Normal Saline.  The procedure is very well tolerated by the patient who is taken to recovery room in a well and stable condition.  Specimen: Endometrial curettings sent to pathology.

## 2024-08-04 NOTE — Interval H&P Note (Signed)
 History and Physical Interval Note:  08/04/2024 12:01 PM  Lynn Kim  has presented today for surgery, with the diagnosis of POST MENOPAUSAL BLEEDING.  The various methods of treatment have been discussed with the patient and family. After consideration of risks, benefits and other options for treatment, the patient has consented to  Procedure(s): DILATATION AND CURETTAGE /HYSTEROSCOPY (N/A) as a surgical intervention.  The patient's history has been reviewed, patient examined, no change in status, stable for surgery.  I have reviewed the patient's chart and labs.  Questions were answered to the patient's satisfaction.     [name]

## 2024-08-05 ENCOUNTER — Encounter (HOSPITAL_COMMUNITY): Payer: Self-pay | Admitting: Obstetrics and Gynecology

## 2024-08-08 LAB — SURGICAL PATHOLOGY

## 2024-08-17 ENCOUNTER — Other Ambulatory Visit: Payer: Self-pay | Admitting: Family Medicine

## 2024-08-17 DIAGNOSIS — Z1231 Encounter for screening mammogram for malignant neoplasm of breast: Secondary | ICD-10-CM

## 2024-09-15 ENCOUNTER — Inpatient Hospital Stay: Admission: RE | Admit: 2024-09-15

## 2024-09-15 DIAGNOSIS — Z1231 Encounter for screening mammogram for malignant neoplasm of breast: Secondary | ICD-10-CM

## 2024-09-26 ENCOUNTER — Other Ambulatory Visit: Payer: Self-pay

## 2024-09-26 ENCOUNTER — Ambulatory Visit: Admitting: Physical Therapy

## 2024-09-26 ENCOUNTER — Encounter: Payer: Self-pay | Admitting: Physical Therapy

## 2024-09-26 DIAGNOSIS — M5459 Other low back pain: Secondary | ICD-10-CM | POA: Diagnosis present

## 2024-09-26 DIAGNOSIS — M25551 Pain in right hip: Secondary | ICD-10-CM | POA: Insufficient documentation

## 2024-09-26 DIAGNOSIS — R262 Difficulty in walking, not elsewhere classified: Secondary | ICD-10-CM | POA: Insufficient documentation

## 2024-09-26 NOTE — Therapy (Signed)
 " OUTPATIENT PHYSICAL THERAPY LOWER EXTREMITY EVALUATION   Patient Name: Lynn Kim MRN: [id number] DOB:09-09-1941, 83 y.o., female Today's Date: 09/26/2024  END OF SESSION:  PT End of Session - 09/26/24 1433     Visit Number 1    Date for Recertification  12/26/23    Authorization Type [employer] MCR    PT Start Time 1434    PT Stop Time 1520    PT Time Calculation (min) 46 min    Activity Tolerance Patient tolerated treatment well    Behavior During Therapy WFL for tasks assessed/performed          Past Medical History:  Diagnosis Date   Anxiety    PAST HISTORY   Arthritis    bilateral hands   Depression    genetic gene   Heart murmur    CHILDHOOD FUNCTIONAL HEART MURMUR   Osteoporosis    Pre-diabetes    Umbilical hernia    Vertigo    Past Surgical History:  Procedure Laterality Date   APPENDECTOMY     BACK SURGERY     BREAST EXCISIONAL BIOPSY     BUNIONECTOMY     COLONOSCOPY     EXTRACORPOREAL SHOCK WAVE LITHOTRIPSY Left 05/29/2023   Procedure: EXTRACORPOREAL SHOCK WAVE LITHOTRIPSY (ESWL);  Surgeon: Watt Rush, MD;  Location: [facility];  Service: Urology;  Laterality: Left;   HYSTEROSCOPY WITH D & C N/A 06/15/2014   Procedure: DILATATION AND CURETTAGE /HYSTEROSCOPY;  Surgeon: Delon CHRISTELLA Prude, DO;  Location: [facility];  Service: Gynecology;  Laterality: N/A;  w/Polypectomy   HYSTEROSCOPY WITH D & C N/A 08/04/2024   Procedure: DILATATION AND CURETTAGE /HYSTEROSCOPY;  Surgeon: Darcel Pool, MD;  Location: [facility];  Service: Gynecology;  Laterality: N/A;   left breast cyst removal     TONSILLECTOMY     UMBILICAL HERNIA REPAIR N/A 09/19/2020   Procedure: OPEN UMBILICAL HERNIA REPAIR;  Surgeon: Dasie Leonor CROME, MD;  Location: [facility];  Service: General;  Laterality: N/A;   Patient Active Problem List   Diagnosis Date Noted   Post-menopausal bleeding 08/04/2024   Coronary artery disease involving native coronary artery of native  heart without angina pectoris 10/31/2022   Chest pain 11/29/2014    PCP: C. Teresa, MD  REFERRING PROVIDER: C. White, MD  REFERRING DIAG: right hip pain  THERAPY DIAG:  Pain in right hip  Other low back pain  Difficulty in walking, not elsewhere classified  Rationale for Evaluation and Treatment: Rehabilitation  ONSET DATE: 08/24/24  SUBJECTIVE:   SUBJECTIVE STATEMENT: Patient reports that about Thanksgiving she had finished a class and was going up stairs and felt a pain in the right hip, x-rays negative, MD felt it was bursitis and some hip flexor strain.  She is very active, reports took about 2 weeks off and still hurting  PERTINENT HISTORY: Past surgery with Harrington Rods from upper Tspine to Lower L spine PAIN:  Are you having pain? Yes: NPRS scale: right anterior and lateral hip 2/10 Pain location: right anterior and lateral hip Pain description: sharp, ache, tight pain up to 10/10 Aggravating factors: stairs, squat lunge pain up to 10/10 Relieving factors: rest, pain cream   She does have right and left shoulder anterior pain also having some left rib flank pain that we saw her for in the past  PRECAUTIONS: Back and Other: fusion  RED FLAGS: None   WEIGHT BEARING RESTRICTIONS: No  FALLS:  Has patient fallen in last 6  months? No  LIVING ENVIRONMENT: Lives with: lives with their family and lives alone Lives in: House/apartment Stairs: stairs where she works out Has following equipment at home: None  OCCUPATION: retired  PLOF: Independent and very active in the gym 5-6x/week with class, walking, weights  PATIENT GOALS: have less pain, return to her classes  NEXT MD VISIT: none scheduled  OBJECTIVE:  Note: Objective measures were completed at Evaluation unless otherwise noted.  DIAGNOSTIC FINDINGS: hip x-ray negative  PATIENT SURVEYS:  HOOS 50%  COGNITION: Overall cognitive status: Within functional limits for tasks  assessed     SENSATION: WFL  MUSCLE LENGTH: Mild HS tightness, tight piriformis very tight adductors, very tight hip flexor and quad  POSTURE: rounded shoulders, forward head, and decreased lumbar lordosis  PALPATION: Very tight and tender in the adductors ribs on the left, hip flexors  LOWER EXTREMITY ROM: WFL  LOWER EXTREMITY MMT:  4+/5 but with pain in the right hip   LOWER EXTREMITY SPECIAL TESTS:  Hip special tests: Belvie (FABER) test: positive , Thomas test: positive , Hip scouring test: negative, and Piriformis test: positive   FUNCTIONAL TESTS:  5 times sit to stand: 16 seconds Timed up and go (TUG): 14 seconds  GAIT: Mild antalgic on the right                                                                                                                                TREATMENT DATE:  09/26/24 Evaluation   PATIENT EDUCATION:  Education details: POC Person educated: Patient Education method: Programmer, Multimedia, Facilities Manager, and Actor cues Education comprehension: verbalized understanding  HOME EXERCISE PROGRAM: Reviewed the need to stretch, AR press with green tband  ASSESSMENT:  CLINICAL IMPRESSION: Patient is a 83 y.o. female who was seen today for physical therapy evaluation and treatment for right hip pain, shoulder pain, back pain and left rib and flank pain.  She is a very active and very healthy person, she is in a lot of classes at the gym.  She is tight in the HS, piriformis, hip flexors, very tight adductors.  Pain is with squatting and with trying lunges.   OBJECTIVE IMPAIRMENTS: Abnormal gait, cardiopulmonary status limiting activity, decreased activity tolerance, decreased balance, decreased endurance, decreased mobility, difficulty walking, decreased ROM, decreased strength, increased fascial restrictions, increased muscle spasms, impaired flexibility, improper body mechanics, postural dysfunction, and pain.   REHAB POTENTIAL: Good  CLINICAL  DECISION MAKING: Stable/uncomplicated  EVALUATION COMPLEXITY: Low   GOALS: Goals reviewed with patient? Yes  SHORT TERM GOALS: Target date: 10/19/24 Independent with initial HEP Baseline: Goal status: INITIAL  LONG TERM GOALS: Target date: 12/25/24  Independent with advanced HEP Baseline:  Goal status: INITIAL  2.  Decrease TUG to 8 seconds Baseline: 14 seconds Goal status: INITIAL  3.  Improve HOOS score to 75% Baseline:  Goal status: INITIAL  4.  Be able to go up and down stairs without pain Baseline:  Goal status: INITIAL  5.  Squat without pain Baseline:  Goal status: INITIAL  PLAN:  PT FREQUENCY: 1x/week  PT DURATION: 12 weeks  PLANNED INTERVENTIONS: 97164- PT Re-evaluation, 97110-Therapeutic exercises, 97530- Therapeutic activity, [id number]- Neuromuscular re-education, 97535- Self Care, 02859- Manual therapy, [phone]- Gait training, [phone]- Electrical stimulation (unattended), 97016- Vasopneumatic device, 97035- Ultrasound, [id number]- Ionotophoresis 4mg /ml Dexamethasone , Patient/Family education, Balance training, Stair training, Taping, Joint mobilization, Cryotherapy, and Moist heat  PLAN FOR NEXT SESSION: STM, core work, flexibility   LIBERTY MEDIA, PT 09/26/2024, 2:34 PM  "

## 2024-10-03 ENCOUNTER — Encounter: Payer: Self-pay | Admitting: Physical Therapy

## 2024-10-03 ENCOUNTER — Ambulatory Visit: Attending: Family Medicine | Admitting: Physical Therapy

## 2024-10-03 DIAGNOSIS — M25551 Pain in right hip: Secondary | ICD-10-CM | POA: Insufficient documentation

## 2024-10-03 DIAGNOSIS — R262 Difficulty in walking, not elsewhere classified: Secondary | ICD-10-CM | POA: Insufficient documentation

## 2024-10-03 DIAGNOSIS — M5459 Other low back pain: Secondary | ICD-10-CM | POA: Insufficient documentation

## 2024-10-03 NOTE — Therapy (Signed)
 " OUTPATIENT PHYSICAL THERAPY LOWER EXTREMITY TREATMENT   Patient Name: Lynn Kim MRN: [id number] DOB:Aug 28, 1941, 84 y.o., female Today's Date: 10/03/2024  END OF SESSION:  PT End of Session - 10/03/24 0844     Visit Number 2    Authorization Type [employer] MCR    PT Start Time 0844    PT Stop Time 0930    PT Time Calculation (min) 46 min    Activity Tolerance Patient tolerated treatment well    Behavior During Therapy WFL for tasks assessed/performed          Past Medical History:  Diagnosis Date   Anxiety    PAST HISTORY   Arthritis    bilateral hands   Depression    genetic gene   Heart murmur    CHILDHOOD FUNCTIONAL HEART MURMUR   Osteoporosis    Pre-diabetes    Umbilical hernia    Vertigo    Past Surgical History:  Procedure Laterality Date   APPENDECTOMY     BACK SURGERY     BREAST EXCISIONAL BIOPSY     BUNIONECTOMY     COLONOSCOPY     EXTRACORPOREAL SHOCK WAVE LITHOTRIPSY Left 05/29/2023   Procedure: EXTRACORPOREAL SHOCK WAVE LITHOTRIPSY (ESWL);  Surgeon: Watt Rush, MD;  Location: [facility];  Service: Urology;  Laterality: Left;   HYSTEROSCOPY WITH D & C N/A 06/15/2014   Procedure: DILATATION AND CURETTAGE /HYSTEROSCOPY;  Surgeon: Delon CHRISTELLA Prude, DO;  Location: [facility];  Service: Gynecology;  Laterality: N/A;  w/Polypectomy   HYSTEROSCOPY WITH D & C N/A 08/04/2024   Procedure: DILATATION AND CURETTAGE /HYSTEROSCOPY;  Surgeon: Darcel Pool, MD;  Location: [facility];  Service: Gynecology;  Laterality: N/A;   left breast cyst removal     TONSILLECTOMY     UMBILICAL HERNIA REPAIR N/A 09/19/2020   Procedure: OPEN UMBILICAL HERNIA REPAIR;  Surgeon: Dasie Leonor CROME, MD;  Location: ;  Service: General;  Laterality: N/A;   Patient Active Problem List   Diagnosis Date Noted   Post-menopausal bleeding 08/04/2024   Coronary artery disease involving native coronary artery of native heart without angina pectoris 10/31/2022    Chest pain 11/29/2014    PCP: C. Teresa, MD  REFERRING PROVIDER: C. White, MD  REFERRING DIAG: right hip pain  THERAPY DIAG:  Pain in right hip  Other low back pain  Difficulty in walking, not elsewhere classified  Rationale for Evaluation and Treatment: Rehabilitation  ONSET DATE: 08/24/24  SUBJECTIVE:   SUBJECTIVE STATEMENT: Patient had a few questions about the HEP, reports some bilateral hip pain and tightness after a walk yesterday  Patient reports that about Thanksgiving she had finished a class and was going up stairs and felt a pain in the right hip, x-rays negative, MD felt it was bursitis and some hip flexor strain.  She is very active, reports took about 2 weeks off and still hurting  PERTINENT HISTORY: Past surgery with Harrington Rods from upper Tspine to Lower L spine PAIN:  Are you having pain? Yes: NPRS scale: right anterior and lateral hip 2/10 Pain location: right anterior and lateral hip Pain description: sharp, ache, tight pain up to 10/10 Aggravating factors: stairs, squat lunge pain up to 10/10 Relieving factors: rest, pain cream   She does have right and left shoulder anterior pain also having some left rib flank pain that we saw her for in the past  PRECAUTIONS: Back and Other: fusion  RED FLAGS: None   WEIGHT  BEARING RESTRICTIONS: No  FALLS:  Has patient fallen in last 6 months? No  LIVING ENVIRONMENT: Lives with: lives with their family and lives alone Lives in: House/apartment Stairs: stairs where she works out Has following equipment at home: None  OCCUPATION: retired  PLOF: Independent and very active in the gym 5-6x/week with class, walking, weights  PATIENT GOALS: have less pain, return to her classes  NEXT MD VISIT: none scheduled  OBJECTIVE:  Note: Objective measures were completed at Evaluation unless otherwise noted.  DIAGNOSTIC FINDINGS: hip x-ray negative  PATIENT SURVEYS:  HOOS 50%  COGNITION: Overall cognitive  status: Within functional limits for tasks assessed     SENSATION: WFL  MUSCLE LENGTH: Mild HS tightness, tight piriformis very tight adductors, very tight hip flexor and quad  POSTURE: rounded shoulders, forward head, and decreased lumbar lordosis  PALPATION: Very tight and tender in the adductors ribs on the left, hip flexors  LOWER EXTREMITY ROM: WFL  LOWER EXTREMITY MMT:  4+/5 but with pain in the right hip   LOWER EXTREMITY SPECIAL TESTS:  Hip special tests: Belvie (FABER) test: positive , Thomas test: positive , Hip scouring test: negative, and Piriformis test: positive   FUNCTIONAL TESTS:  5 times sit to stand: 16 seconds Timed up and go (TUG): 14 seconds  GAIT: Mild antalgic on the right                                                                                                                                TREATMENT DATE:  10/03/24 Reviewed and performed HEP with some verbal cues  Feet on ball K2C, rotation, bridge, isometric abs Passive stretch LE's Green tband clamshells Ball b/n knees squeeze STM to the LE's  09/26/24 Evaluation   PATIENT EDUCATION:  Education details: POC Person educated: Patient Education method: Programmer, Multimedia, Facilities Manager, and Actor cues Education comprehension: verbalized understanding  HOME EXERCISE PROGRAM: Reviewed the need to stretch, AR press with green tband  ASSESSMENT:  CLINICAL IMPRESSION: Again patient very active, had soreness in the hips after a walk yesterday, she is tight and very tender with some of the exercises today she started having cramps in the thigh mms  Patient is a 84 y.o. female who was seen today for physical therapy evaluation and treatment for right hip pain, shoulder pain, back pain and left rib and flank pain.  She is a very active and very healthy person, she is in a lot of classes at the gym.  She is tight in the HS, piriformis, hip flexors, very tight adductors.  Pain is with squatting  and with trying lunges.   OBJECTIVE IMPAIRMENTS: Abnormal gait, cardiopulmonary status limiting activity, decreased activity tolerance, decreased balance, decreased endurance, decreased mobility, difficulty walking, decreased ROM, decreased strength, increased fascial restrictions, increased muscle spasms, impaired flexibility, improper body mechanics, postural dysfunction, and pain.   REHAB POTENTIAL: Good  CLINICAL DECISION MAKING: Stable/uncomplicated  EVALUATION COMPLEXITY: Low   GOALS:  Goals reviewed with patient? Yes  SHORT TERM GOALS: Target date: 10/19/24 Independent with initial HEP Baseline: Goal status: progressing 10/03/24  LONG TERM GOALS: Target date: 12/25/24  Independent with advanced HEP Baseline:  Goal status: INITIAL  2.  Decrease TUG to 8 seconds Baseline: 14 seconds Goal status: INITIAL  3.  Improve HOOS score to 75% Baseline:  Goal status: INITIAL  4.  Be able to go up and down stairs without pain Baseline:  Goal status: INITIAL  5.  Squat without pain Baseline:  Goal status: INITIAL  PLAN:  PT FREQUENCY: 1x/week  PT DURATION: 12 weeks  PLANNED INTERVENTIONS: 97164- PT Re-evaluation, 97110-Therapeutic exercises, 97530- Therapeutic activity, 97112- Neuromuscular re-education, 97535- Self Care, 02859- Manual therapy, [phone]- Gait training, [phone]- Electrical stimulation (unattended), 97016- Vasopneumatic device, 97035- Ultrasound, 02966- Ionotophoresis 4mg /ml Dexamethasone , Patient/Family education, Balance training, Stair training, Taping, Joint mobilization, Cryotherapy, and Moist heat  PLAN FOR NEXT SESSION: STM, core work, flexibility   LIBERTY MEDIA, PT 10/03/2024, 8:54 AM  "

## 2024-10-17 ENCOUNTER — Ambulatory Visit: Admitting: Physical Therapy

## 2024-10-17 ENCOUNTER — Encounter: Payer: Self-pay | Admitting: Physical Therapy

## 2024-10-17 DIAGNOSIS — M25551 Pain in right hip: Secondary | ICD-10-CM | POA: Diagnosis not present

## 2024-10-17 DIAGNOSIS — R262 Difficulty in walking, not elsewhere classified: Secondary | ICD-10-CM

## 2024-10-17 DIAGNOSIS — M5459 Other low back pain: Secondary | ICD-10-CM

## 2024-10-17 NOTE — Therapy (Signed)
 " OUTPATIENT PHYSICAL THERAPY LOWER EXTREMITY TREATMENT   Patient Name: Lynn Kim MRN: [id number] DOB:1941/05/22, 84 y.o., female Today's Date: 10/17/2024  END OF SESSION:  PT End of Session - 10/17/24 1603     Visit Number 3    Date for Recertification  12/26/23    Authorization Type [employer] MCR    PT Start Time 1602    PT Stop Time 1700    PT Time Calculation (min) 58 min    Activity Tolerance Patient tolerated treatment well    Behavior During Therapy WFL for tasks assessed/performed          Past Medical History:  Diagnosis Date   Anxiety    PAST HISTORY   Arthritis    bilateral hands   Depression    genetic gene   Heart murmur    CHILDHOOD FUNCTIONAL HEART MURMUR   Osteoporosis    Pre-diabetes    Umbilical hernia    Vertigo    Past Surgical History:  Procedure Laterality Date   APPENDECTOMY     BACK SURGERY     BREAST EXCISIONAL BIOPSY     BUNIONECTOMY     COLONOSCOPY     EXTRACORPOREAL SHOCK WAVE LITHOTRIPSY Left 05/29/2023   Procedure: EXTRACORPOREAL SHOCK WAVE LITHOTRIPSY (ESWL);  Surgeon: Watt Rush, MD;  Location: [facility];  Service: Urology;  Laterality: Left;   HYSTEROSCOPY WITH D & C N/A 06/15/2014   Procedure: DILATATION AND CURETTAGE /HYSTEROSCOPY;  Surgeon: Delon CHRISTELLA Prude, DO;  Location: [facility];  Service: Gynecology;  Laterality: N/A;  w/Polypectomy   HYSTEROSCOPY WITH D & C N/A 08/04/2024   Procedure: DILATATION AND CURETTAGE /HYSTEROSCOPY;  Surgeon: Darcel Pool, MD;  Location: [facility];  Service: Gynecology;  Laterality: N/A;   left breast cyst removal     TONSILLECTOMY     UMBILICAL HERNIA REPAIR N/A 09/19/2020   Procedure: OPEN UMBILICAL HERNIA REPAIR;  Surgeon: Dasie Leonor CROME, MD;  Location: [facility];  Service: General;  Laterality: N/A;   Patient Active Problem List   Diagnosis Date Noted   Post-menopausal bleeding 08/04/2024   Coronary artery disease involving native coronary artery of native heart  without angina pectoris 10/31/2022   Chest pain 11/29/2014    PCP: C. Teresa, MD  REFERRING PROVIDER: C. White, MD  REFERRING DIAG: right hip pain  THERAPY DIAG:  Pain in right hip  Other low back pain  Difficulty in walking, not elsewhere classified  Rationale for Evaluation and Treatment: Rehabilitation  ONSET DATE: 08/24/24  SUBJECTIVE:   SUBJECTIVE STATEMENT: Reports that after the last visit she was able to go up the stairs without difficulty or pain for the first time in a long time  Patient reports that about Thanksgiving she had finished a class and was going up stairs and felt a pain in the right hip, x-rays negative, MD felt it was bursitis and some hip flexor strain.  She is very active, reports took about 2 weeks off and still hurting  PERTINENT HISTORY: Past surgery with Harrington Rods from upper Tspine to Lower L spine PAIN:  Are you having pain? Yes: NPRS scale: right anterior and lateral hip 2/10 Pain location: right anterior and lateral hip Pain description: sharp, ache, tight pain up to 10/10 Aggravating factors: stairs, squat lunge pain up to 10/10 Relieving factors: rest, pain cream   She does have right and left shoulder anterior pain also having some left rib flank pain that we saw her for in  the past  PRECAUTIONS: Back and Other: fusion  RED FLAGS: None   WEIGHT BEARING RESTRICTIONS: No  FALLS:  Has patient fallen in last 6 months? No  LIVING ENVIRONMENT: Lives with: lives with their family and lives alone Lives in: House/apartment Stairs: stairs where she works out Has following equipment at home: None  OCCUPATION: retired  PLOF: Independent and very active in the gym 5-6x/week with class, walking, weights  PATIENT GOALS: have less pain, return to her classes  NEXT MD VISIT: none scheduled  OBJECTIVE:  Note: Objective measures were completed at Evaluation unless otherwise noted.  DIAGNOSTIC FINDINGS: hip x-ray negative  PATIENT  SURVEYS:  HOOS 50%  COGNITION: Overall cognitive status: Within functional limits for tasks assessed     SENSATION: WFL  MUSCLE LENGTH: Mild HS tightness, tight piriformis very tight adductors, very tight hip flexor and quad  POSTURE: rounded shoulders, forward head, and decreased lumbar lordosis  PALPATION: Very tight and tender in the adductors ribs on the left, hip flexors  LOWER EXTREMITY ROM: WFL  LOWER EXTREMITY MMT:  4+/5 but with pain in the right hip   LOWER EXTREMITY SPECIAL TESTS:  Hip special tests: Belvie (FABER) test: positive , Thomas test: positive , Hip scouring test: negative, and Piriformis test: positive   FUNCTIONAL TESTS:  5 times sit to stand: 16 seconds Timed up and go (TUG): 14 seconds  GAIT: Mild antalgic on the right                                                                                                                                TREATMENT DATE:  10/17/24 Went over different ways to stretch especially HS Feet on ball K2C, rotation, bridge, isometric abs Green tband clamshells Ball b/n knees squeeze with bridge Passive stretch LE's STM to the LE's  10/03/24 Reviewed and performed HEP with some verbal cues  Feet on ball K2C, rotation, bridge, isometric abs Passive stretch LE's Green tband clamshells Ball b/n knees squeeze STM to the LE's  09/26/24 Evaluation   PATIENT EDUCATION:  Education details: POC Person educated: Patient Education method: Programmer, Multimedia, Facilities Manager, and Actor cues Education comprehension: verbalized understanding  HOME EXERCISE PROGRAM: Reviewed the need to stretch, AR press with green tband  ASSESSMENT:  CLINICAL IMPRESSION: Patient felt like the last treatment really helped reports that it was the first time able to go up and down the stairs without pain or difficulty, replicated the treatment and added some strength. STG met  Patient is a 84 y.o. female who was seen today for physical  therapy evaluation and treatment for right hip pain, shoulder pain, back pain and left rib and flank pain.  She is a very active and very healthy person, she is in a lot of classes at the gym.  She is tight in the HS, piriformis, hip flexors, very tight adductors.  Pain is with squatting and with trying lunges.   OBJECTIVE IMPAIRMENTS: Abnormal  gait, cardiopulmonary status limiting activity, decreased activity tolerance, decreased balance, decreased endurance, decreased mobility, difficulty walking, decreased ROM, decreased strength, increased fascial restrictions, increased muscle spasms, impaired flexibility, improper body mechanics, postural dysfunction, and pain.   REHAB POTENTIAL: Good  CLINICAL DECISION MAKING: Stable/uncomplicated  EVALUATION COMPLEXITY: Low   GOALS: Goals reviewed with patient? Yes  SHORT TERM GOALS: Target date: 10/19/24 Independent with initial HEP Baseline: Goal status: met 10/17/24  LONG TERM GOALS: Target date: 12/25/24  Independent with advanced HEP Baseline:  Goal status: INITIAL  2.  Decrease TUG to 8 seconds Baseline: 14 seconds Goal status: INITIAL  3.  Improve HOOS score to 75% Baseline:  Goal status: INITIAL  4.  Be able to go up and down stairs without pain Baseline:  Goal status: INITIAL  5.  Squat without pain Baseline:  Goal status: INITIAL  PLAN:  PT FREQUENCY: 1x/week  PT DURATION: 12 weeks  PLANNED INTERVENTIONS: 97164- PT Re-evaluation, 97110-Therapeutic exercises, 97530- Therapeutic activity, [id number]- Neuromuscular re-education, 97535- Self Care, 02859- Manual therapy, [phone]- Gait training, [phone]- Electrical stimulation (unattended), 97016- Vasopneumatic device, 97035- Ultrasound, 02966- Ionotophoresis 4mg /ml Dexamethasone , Patient/Family education, Balance training, Stair training, Taping, Joint mobilization, Cryotherapy, and Moist heat  PLAN FOR NEXT SESSION: STM, core work, flexibility   LIBERTY MEDIA, PT 10/17/2024,  4:04 PM  "

## 2024-10-24 ENCOUNTER — Ambulatory Visit: Admitting: Physical Therapy

## 2024-10-25 ENCOUNTER — Ambulatory Visit: Admitting: Pulmonary Disease

## 2024-10-31 ENCOUNTER — Ambulatory Visit: Admitting: Physical Therapy

## 2024-11-14 ENCOUNTER — Ambulatory Visit: Admitting: Physical Therapy

## 2024-11-21 ENCOUNTER — Ambulatory Visit: Admitting: Physical Therapy

## 2024-11-28 ENCOUNTER — Ambulatory Visit: Admitting: Physical Therapy

## 2024-11-29 ENCOUNTER — Ambulatory Visit: Admitting: Pulmonary Disease
